# Patient Record
Sex: Female | Born: 1990
Health system: Southern US, Community
[De-identification: ages and names within clinical notes are randomized; demographics above are authoritative.]

## PROBLEM LIST (undated history)

## (undated) DIAGNOSIS — E559 Vitamin D deficiency, unspecified: Secondary | ICD-10-CM

## (undated) DIAGNOSIS — T7840XA Allergy, unspecified, initial encounter: Secondary | ICD-10-CM

## (undated) DIAGNOSIS — R011 Cardiac murmur, unspecified: Secondary | ICD-10-CM

## (undated) DIAGNOSIS — D649 Anemia, unspecified: Secondary | ICD-10-CM

## (undated) DIAGNOSIS — I517 Cardiomegaly: Secondary | ICD-10-CM

## (undated) HISTORY — DX: Anemia, unspecified: D64.9

## (undated) HISTORY — PX: DENTAL SURGERY: SHX609

## (undated) HISTORY — DX: Cardiac murmur, unspecified: R01.1

## (undated) HISTORY — PX: TONSILLECTOMY: SUR1361

---

## 2013-11-11 ENCOUNTER — Emergency Department (HOSPITAL_COMMUNITY)
Admission: EM | Admit: 2013-11-11 | Discharge: 2013-11-11 | Disposition: A | Discharge status: Home / Self Care | Payer: No Typology Code available for payment source | Attending: Emergency Medicine | Admitting: Emergency Medicine

## 2013-11-11 ENCOUNTER — Encounter (HOSPITAL_COMMUNITY): Payer: Self-pay | Admitting: Emergency Medicine

## 2013-11-11 DIAGNOSIS — R221 Localized swelling, mass and lump, neck: Principal | ICD-10-CM

## 2013-11-11 DIAGNOSIS — Z8679 Personal history of other diseases of the circulatory system: Secondary | ICD-10-CM | POA: Insufficient documentation

## 2013-11-11 DIAGNOSIS — Y929 Unspecified place or not applicable: Secondary | ICD-10-CM | POA: Insufficient documentation

## 2013-11-11 DIAGNOSIS — R220 Localized swelling, mass and lump, head: Secondary | ICD-10-CM | POA: Insufficient documentation

## 2013-11-11 DIAGNOSIS — Y939 Activity, unspecified: Secondary | ICD-10-CM | POA: Insufficient documentation

## 2013-11-11 DIAGNOSIS — Z791 Long term (current) use of non-steroidal anti-inflammatories (NSAID): Secondary | ICD-10-CM | POA: Insufficient documentation

## 2013-11-11 DIAGNOSIS — T7840XA Allergy, unspecified, initial encounter: Secondary | ICD-10-CM

## 2013-11-11 DIAGNOSIS — E559 Vitamin D deficiency, unspecified: Secondary | ICD-10-CM | POA: Insufficient documentation

## 2013-11-11 DIAGNOSIS — T628X1A Toxic effect of other specified noxious substances eaten as food, accidental (unintentional), initial encounter: Secondary | ICD-10-CM | POA: Insufficient documentation

## 2013-11-11 HISTORY — DX: Cardiomegaly: I51.7

## 2013-11-11 HISTORY — DX: Vitamin D deficiency, unspecified: E55.9

## 2013-11-11 MED ORDER — EPINEPHRINE 0.3 MG/0.3ML IJ SOAJ: 0.3000 mg | INTRAMUSCULAR | Status: DC | PRN

## 2013-11-11 MED ORDER — METHYLPREDNISOLONE SODIUM SUCC 125 MG IJ SOLR
125.0000 mg | Freq: Once | INTRAMUSCULAR | Status: AC
  Administered 2013-11-11: 125 mg via INTRAVENOUS
  Filled 2013-11-11: qty 2

## 2013-11-11 MED ORDER — DIPHENHYDRAMINE HCL 50 MG/ML IJ SOLN
25.0000 mg | Freq: Once | INTRAMUSCULAR | Status: AC
  Administered 2013-11-11: 25 mg via INTRAVENOUS
  Filled 2013-11-11: qty 1

## 2013-11-11 MED ORDER — DIPHENHYDRAMINE HCL 25 MG PO TABS: 25.0000 mg | ORAL_TABLET | Freq: Four times a day (QID) | ORAL | Status: DC

## 2013-11-11 MED ORDER — PREDNISONE 10 MG PO TABS: 60.0000 mg | ORAL_TABLET | Freq: Every day | ORAL | Status: DC

## 2013-11-11 MED ORDER — FAMOTIDINE 20 MG PO TABS
20.0000 mg | ORAL_TABLET | Freq: Once | ORAL | Status: AC
  Administered 2013-11-11: 20 mg via ORAL
  Filled 2013-11-11: qty 1

## 2013-11-11 MED ORDER — EPINEPHRINE 0.3 MG/0.3ML IJ SOAJ
0.3000 mg | Freq: Once | INTRAMUSCULAR | Status: AC
  Administered 2013-11-11: 0.3 mg via INTRAMUSCULAR
  Filled 2013-11-11: qty 0.3

## 2013-11-11 NOTE — Discharge Instructions (Signed)
Epinephrine Injection °Epinephrine is a medicine given by injection to temporarily treat an emergency allergic reaction. It is also used to treat severe asthmatic attacks and other lung problems. The medicine helps to enlarge (dilate) the small breathing tubes of the lungs. A life-threatening, sudden allergic reaction that involves the whole body is called anaphylaxis. Because of potential side effects, epinephrine should only be used as directed by your caregiver. °RISKS AND COMPLICATIONS °Possible side effects of epinephrine injections include: °· Chest pain. °· Irregular or rapid heartbeat. °· Shortness of breath. °· Nausea. °· Vomiting. °· Abdominal pain or cramping. °· Sweating. °· Dizziness. °· Weakness. °· Headache. °· Nervousness. °Report all side effects to your caregiver. °HOW TO GIVE AN EPINEPHRINE INJECTION °Give the epinephrine injection immediately when symptoms of a severe reaction begin. Inject the medicine into the outer thigh or any available, large muscle. Your caregiver can teach you how to do this. You do not need to remove any clothing. After the injection, call your local emergency services (911 in [place] Even if you improve after the injection, you need to be examined at a hospital emergency department. Epinephrine works quickly, but it also wears off quickly. Delayed reactions can occur. A delayed reaction may be as serious and dangerous as the initial reaction. °HOME CARE INSTRUCTIONS °· Make sure you and your family know how to give an epinephrine injection. °· Use epinephrine injections as directed by your caregiver. Do not use this medicine more often or in larger doses than prescribed. °· Always carry your epinephrine injection or anaphylaxis kit with you. This can be lifesaving if you have a severe reaction. °· Store the medicine in a cool, dry place. If the medicine becomes discolored or cloudy, dispose of it properly and replace it with new medicine. °· Check the expiration date on  your medicine. It may be unsafe to use medicines past their expiration date. °· Tell your caregiver about any other medicines you are taking. Some medicines can react badly with epinephrine. °· Tell your caregiver about any medical conditions you have, such as diabetes, high blood pressure (hypertension), heart disease, irregular heartbeats, or if you are pregnant. °SEEK IMMEDIATE MEDICAL CARE IF: °· You have used an epinephrine injection. Call your local emergency services (911 in [place] Even if you improve after the injection, you need to be examined at a hospital emergency department to make sure your allergic reaction is under control. You will also be monitored for adverse effects from the medicine. °· You have chest pain. °· You have irregular or fast heartbeats. °· You have shortness of breath. °· You have severe headaches. °· You have severe nausea, vomiting, or abdominal cramps. °· You have severe pain, swelling, or redness in the area where you gave the injection. °Document Released: 04/19/2000 Document Revised: 07/15/2011 Document Reviewed: 01/09/2011 °ExitCare® Patient Information ©2015 [employer]. This information is not intended to replace advice given to you by your health care provider. Make sure you discuss any questions you have with your health care provider. ° °

## 2013-11-11 NOTE — ED Notes (Addendum)
Per pt went through [employer] drive thru ordering sprite and chicken fries. Pt reports eating chicken fries but started reaction with sip of sprite. Pt reports chest tightness, wheezing, SOB, and throat/tongue swelling. After reaction started pt noticed sprite appeared gold in color. Pt reports taking 12.5 mg of liquid benadryl. Pt reports when arrived to ED NO chest tightness and wheezing. Upon assessment pt tongue appears swollen and pt reports itching of throat/tongue.

## 2013-11-11 NOTE — ED Provider Notes (Signed)
CSN: [id number]     Arrival date & time 11/11/13  1318 History   First MD Initiated Contact with Patient 11/11/13 1339     Chief Complaint  Patient presents with  . Allergic Reaction      HPI Patient was brought to the emergency department because of allergic reaction that began just prior to arrival.  She has a history of peanut allergy.  She feels as though she is having swelling of her tongue and throat.  Started Benadryl and has had mild improvement in her symptoms.  She states she still feels her tongue was swollen and her voice is still abnormal.  No difficulty breathing.  Initially she states she felt as though her throat was closing up was given Benadryl by her mother.  Patient has an EpiPen but decided not to use it.   Past Medical History  Diagnosis Date  . Enlarged heart   . Vitamin D deficiency    Past Surgical History  Procedure Laterality Date  . Tonsillectomy    . Dental surgery     Family History  Problem Relation Age of Onset  . Hypertension Mother    History  Substance Use Topics  . Smoking status: Never Smoker   . Smokeless tobacco: Never Used  . Alcohol Use: No   OB History   Grav Para Term Preterm Abortions TAB SAB Ect Mult Living                 Review of Systems  All other systems reviewed and are negative.     Allergies  Peanuts  Home Medications   Prior to Admission medications   Medication Sig Start Date End Date Taking? Authorizing Provider  Adapalene-Benzoyl Peroxide 0.1-2.5 % gel Apply 1 application topically at bedtime.   Yes Historical Provider, MD  diphenhydrAMINE (BENADRYL) 25 MG tablet Take 1 tablet (25 mg total) by mouth every 6 (six) hours. 11/11/13   Lyanne Co, MD  EPINEPHrine (EPIPEN) 0.3 mg/0.3 mL IJ SOAJ injection Inject 0.3 mLs (0.3 mg total) into the muscle as needed. 11/11/13   Lyanne Co, MD  ibuprofen (ADVIL,MOTRIN) 200 MG tablet Take 400 mg by mouth every 4 (four) hours as needed for moderate pain.   Yes  Historical Provider, MD  predniSONE (DELTASONE) 10 MG tablet Take 6 tablets (60 mg total) by mouth daily. 11/11/13   Lyanne Co, MD  Vitamin D, Ergocalciferol, (DRISDOL) 50000 UNITS CAPS capsule Take 50,000 Units by mouth 2 (two) times a week.   Yes Historical Provider, MD   BP 115/57  Pulse 96  Temp(Src) 98.5 F (36.9 C) (Oral)  Resp 16  Ht 5\' 7"  (1.702 m)  Wt 240 lb (108.863 kg)  BMI 37.58 kg/m2  SpO2 100%  LMP 10/15/2013 Physical Exam  Nursing note and vitals reviewed. Constitutional: She is oriented to person, place, and time. She appears well-developed and well-nourished. No distress.  HENT:  Head: Normocephalic and atraumatic.  Eyes: EOM are normal.  Neck: Normal range of motion.  Cardiovascular: Normal rate, regular rhythm and normal heart sounds.   Pulmonary/Chest: Effort normal and breath sounds normal.  Abdominal: Soft. She exhibits no distension. There is no tenderness.  Musculoskeletal: Normal range of motion.  Neurological: She is alert and oriented to person, place, and time.  Skin: Skin is warm and dry.  Psychiatric: She has a normal mood and affect. Judgment normal.    ED Course  Procedures (including critical care time) Labs Review Labs Reviewed -  No data to display  Imaging Review No results found.   EKG Interpretation None      MDM   Final diagnoses:  Allergic reaction, initial encounter    4:04 PM Patient feels much better at this time.  Patient was given epinephrine in the emergency department.  I instructed should the patient and the patient's mother how to inject this.  I've instructed them that in the future the epinephrine should be given and should be given quickly.    Lyanne CoKevin M Mariadejesus Cade, MD 11/11/13 [phone]

## 2013-11-11 NOTE — ED Notes (Signed)
Patient c/o hAving an allergic reaction with swelling of the tongue and throat. Patient states she did have wheezing and chest tightness 30 minutes go. Patient states that she ordered a Sprite and was given a drink by an employee. Patient states she took a sip of the drink and had an immediate reaction of chest tightness, wheezing , and difficulty swallowing. [employer] was flagged down by the mother and called EMS and directed her to the ED. Patient took Benadryl po prior to coming to the ED.

## 2013-11-18 ENCOUNTER — Ambulatory Visit (HOSPITAL_COMMUNITY): Payer: Self-pay | Admitting: Psychiatry

## 2013-11-30 ENCOUNTER — Ambulatory Visit: Payer: Self-pay | Admitting: Cardiovascular Disease

## 2013-12-27 ENCOUNTER — Emergency Department (HOSPITAL_COMMUNITY): Payer: No Typology Code available for payment source

## 2013-12-27 ENCOUNTER — Emergency Department (HOSPITAL_COMMUNITY)
Admission: EM | Admit: 2013-12-27 | Discharge: 2013-12-27 | Disposition: A | Discharge status: Home / Self Care | Payer: No Typology Code available for payment source | Attending: Emergency Medicine | Admitting: Emergency Medicine

## 2013-12-27 ENCOUNTER — Encounter (HOSPITAL_COMMUNITY): Payer: Self-pay | Admitting: Emergency Medicine

## 2013-12-27 DIAGNOSIS — Z79899 Other long term (current) drug therapy: Secondary | ICD-10-CM | POA: Insufficient documentation

## 2013-12-27 DIAGNOSIS — S0993XA Unspecified injury of face, initial encounter: Secondary | ICD-10-CM | POA: Insufficient documentation

## 2013-12-27 DIAGNOSIS — IMO0002 Reserved for concepts with insufficient information to code with codable children: Secondary | ICD-10-CM | POA: Insufficient documentation

## 2013-12-27 DIAGNOSIS — S134XXA Sprain of ligaments of cervical spine, initial encounter: Secondary | ICD-10-CM

## 2013-12-27 DIAGNOSIS — S199XXA Unspecified injury of neck, initial encounter: Secondary | ICD-10-CM

## 2013-12-27 DIAGNOSIS — Z8679 Personal history of other diseases of the circulatory system: Secondary | ICD-10-CM | POA: Insufficient documentation

## 2013-12-27 DIAGNOSIS — S139XXA Sprain of joints and ligaments of unspecified parts of neck, initial encounter: Secondary | ICD-10-CM | POA: Insufficient documentation

## 2013-12-27 DIAGNOSIS — S0990XA Unspecified injury of head, initial encounter: Secondary | ICD-10-CM | POA: Diagnosis present

## 2013-12-27 DIAGNOSIS — Y92410 Unspecified street and highway as the place of occurrence of the external cause: Secondary | ICD-10-CM | POA: Diagnosis not present

## 2013-12-27 DIAGNOSIS — Y9389 Activity, other specified: Secondary | ICD-10-CM | POA: Insufficient documentation

## 2013-12-27 DIAGNOSIS — E559 Vitamin D deficiency, unspecified: Secondary | ICD-10-CM | POA: Diagnosis not present

## 2013-12-27 HISTORY — DX: Allergy, unspecified, initial encounter: T78.40XA

## 2013-12-27 MED ORDER — HYDROCODONE-ACETAMINOPHEN 5-325 MG PO TABS: 1.0000 | ORAL_TABLET | Freq: Once | ORAL | Status: DC

## 2013-12-27 MED ORDER — TRAMADOL HCL 50 MG PO TABS: 50.0000 mg | ORAL_TABLET | Freq: Four times a day (QID) | ORAL | Status: DC | PRN

## 2013-12-27 MED ORDER — IBUPROFEN 800 MG PO TABS
800.0000 mg | ORAL_TABLET | Freq: Once | ORAL | Status: DC
  Filled 2013-12-27: qty 1

## 2013-12-27 MED ORDER — CYCLOBENZAPRINE HCL 10 MG PO TABS: 10.0000 mg | ORAL_TABLET | Freq: Two times a day (BID) | ORAL | Status: DC | PRN

## 2013-12-27 MED ORDER — ONDANSETRON 4 MG PO TBDP: 4.0000 mg | ORAL_TABLET | Freq: Once | ORAL | Status: DC

## 2013-12-27 NOTE — ED Notes (Signed)
Notified PA earlier that pt wanted to d/c home.  Pt refusing all meds.  DC home post PA conference.
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
## 2013-12-27 NOTE — Discharge Instructions (Signed)
Muscle Strain °A muscle strain is an injury that occurs when a muscle is stretched beyond its normal length. Usually a small number of muscle fibers are torn when this happens. Muscle strain is rated in degrees. First-degree strains have the least amount of muscle fiber tearing and pain. Second-degree and third-degree strains have increasingly more tearing and pain.  °Usually, recovery from muscle strain takes 1-2 weeks. Complete healing takes 5-6 weeks.  °CAUSES  °Muscle strain happens when a sudden, violent force placed on a muscle stretches it too far. This may occur with lifting, sports, or a fall.  °RISK FACTORS °Muscle strain is especially common in athletes.  °SIGNS AND SYMPTOMS °At the site of the muscle strain, there may be: °· Pain. °· Bruising. °· Swelling. °· Difficulty using the muscle due to pain or lack of normal function. °DIAGNOSIS  °Your health care provider will perform a physical exam and ask about your medical history. °TREATMENT  °Often, the best treatment for a muscle strain is resting, icing, and applying cold compresses to the injured area.   °HOME CARE INSTRUCTIONS  °· Use the PRICE method of treatment to promote muscle healing during the first 2-3 days after your injury. The PRICE method involves: °· Protecting the muscle from being injured again. °· Restricting your activity and resting the injured body part. °· Icing your injury. To do this, put ice in a plastic bag. Place a towel between your skin and the bag. Then, apply the ice and leave it on from 15-20 minutes each hour. After the third day, switch to moist heat packs. °· Apply compression to the injured area with a splint or elastic bandage. Be careful not to wrap it too tightly. This may interfere with blood circulation or increase swelling. °· Elevate the injured body part above the level of your heart as often as you can. °· Only take over-the-counter or prescription medicines for pain, discomfort, or fever as directed by your  health care provider. °· Warming up prior to exercise helps to prevent future muscle strains. °SEEK MEDICAL CARE IF:  °· You have increasing pain or swelling in the injured area. °· You have numbness, tingling, or a significant loss of strength in the injured area. °MAKE SURE YOU:  °· Understand these instructions. °· Will watch your condition. °· Will get help right away if you are not doing well or get worse. °Document Released: 04/22/2005 Document Revised: 02/10/2013 Document Reviewed: 11/19/2012 °ExitCare® Patient Information ©2015 [employer]. This information is not intended to replace advice given to you by your health care provider. Make sure you discuss any questions you have with your health care provider. ° °Motor Vehicle Collision °It is common to have multiple bruises and sore muscles after a motor vehicle collision (MVC). These tend to feel worse for the first 24 hours. You may have the most stiffness and soreness over the first several hours. You may also feel worse when you wake up the first morning after your collision. After this point, you will usually begin to improve with each day. The speed of improvement often depends on the severity of the collision, the number of injuries, and the location and nature of these injuries. °HOME CARE INSTRUCTIONS °· Put ice on the injured area. °¨ Put ice in a plastic bag. °¨ Place a towel between your skin and the bag. °¨ Leave the ice on for 15-20 minutes, 3-4 times a day, or as directed by your health care provider. °· Drink enough fluids to   keep your urine clear or pale yellow. Do not drink alcohol. °· Take a warm shower or bath once or twice a day. This will increase blood flow to sore muscles. °· You may return to activities as directed by your caregiver. Be careful when lifting, as this may aggravate neck or back pain. °· Only take over-the-counter or prescription medicines for pain, discomfort, or fever as directed by your caregiver. Do not use  aspirin. This may increase bruising and bleeding. °SEEK IMMEDIATE MEDICAL CARE IF: °· You have numbness, tingling, or weakness in the arms or legs. °· You develop severe headaches not relieved with medicine. °· You have severe neck pain, especially tenderness in the middle of the back of your neck. °· You have changes in bowel or bladder control. °· There is increasing pain in any area of the body. °· You have shortness of breath, light-headedness, dizziness, or fainting. °· You have chest pain. °· You feel sick to your stomach (nauseous), throw up (vomit), or sweat. °· You have increasing abdominal discomfort. °· There is blood in your urine, stool, or vomit. °· You have pain in your shoulder (shoulder strap areas). °· You feel your symptoms are getting worse. °MAKE SURE YOU: °· Understand these instructions. °· Will watch your condition. °· Will get help right away if you are not doing well or get worse. °Document Released: 04/22/2005 Document Revised: 09/06/2013 Document Reviewed: 09/19/2010 °ExitCare® Patient Information ©2015 [employer]. This information is not intended to replace advice given to you by your health care provider. Make sure you discuss any questions you have with your health care provider. ° °
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
## 2013-12-28 NOTE — ED Provider Notes (Signed)
Medical screening examination/treatment/procedure(s) were performed by non-physician practitioner and as supervising physician I was immediately available for consultation/collaboration.   EKG Interpretation None       Raeford Razor, MD 12/28/13 [phone]

## 2014-03-06 ENCOUNTER — Ambulatory Visit (INDEPENDENT_AMBULATORY_CARE_PROVIDER_SITE_OTHER): Payer: No Typology Code available for payment source

## 2014-03-06 ENCOUNTER — Ambulatory Visit (INDEPENDENT_AMBULATORY_CARE_PROVIDER_SITE_OTHER): Payer: No Typology Code available for payment source | Admitting: Family Medicine

## 2014-03-06 VITALS — BP 118/70 | HR 97 | Temp 99.0°F | Resp 18 | Ht 66.0 in | Wt 264.0 lb

## 2014-03-06 DIAGNOSIS — G939 Disorder of brain, unspecified: Secondary | ICD-10-CM

## 2014-03-06 DIAGNOSIS — R05 Cough: Secondary | ICD-10-CM

## 2014-03-06 DIAGNOSIS — R0600 Dyspnea, unspecified: Secondary | ICD-10-CM

## 2014-03-06 DIAGNOSIS — R059 Cough, unspecified: Secondary | ICD-10-CM

## 2014-03-06 DIAGNOSIS — G9389 Other specified disorders of brain: Secondary | ICD-10-CM

## 2014-03-06 DIAGNOSIS — R509 Fever, unspecified: Secondary | ICD-10-CM

## 2014-03-06 DIAGNOSIS — R599 Enlarged lymph nodes, unspecified: Secondary | ICD-10-CM

## 2014-03-06 DIAGNOSIS — N926 Irregular menstruation, unspecified: Secondary | ICD-10-CM

## 2014-03-06 DIAGNOSIS — R591 Generalized enlarged lymph nodes: Secondary | ICD-10-CM

## 2014-03-06 DIAGNOSIS — W19XXXA Unspecified fall, initial encounter: Secondary | ICD-10-CM

## 2014-03-06 LAB — POCT CBC
Granulocyte percent: 56.6 %G (ref 37–80)
HEMATOCRIT: 37.7 % (ref 37.7–47.9)
Hemoglobin: 11.7 g/dL — AB (ref 12.2–16.2)
LYMPH, POC: 3.5 — AB (ref 0.6–3.4)
MCH, POC: 25.7 pg — AB (ref 27–31.2)
MCHC: 30.9 g/dL — AB (ref 31.8–35.4)
MCV: 82.9 fL (ref 80–97)
MID (cbc): 0.2 (ref 0–0.9)
MPV: 9.5 fL (ref 0–99.8)
POC GRANULOCYTE: 4.9 (ref 2–6.9)
POC LYMPH %: 40.7 % (ref 10–50)
POC MID %: 2.7 %M (ref 0–12)
Platelet Count, POC: 285 10*3/uL (ref 142–424)
RBC: 4.55 M/uL (ref 4.04–5.48)
RDW, POC: 16.4 %
WBC: 8.6 10*3/uL (ref 4.6–10.2)

## 2014-03-06 LAB — POCT URINALYSIS DIPSTICK
Bilirubin, UA: NEGATIVE
Glucose, UA: NEGATIVE
Ketones, UA: NEGATIVE
Leukocytes, UA: NEGATIVE
NITRITE UA: NEGATIVE
PH UA: 5.5
PROTEIN UA: NEGATIVE
Spec Grav, UA: 1.015
Urobilinogen, UA: 0.2

## 2014-03-06 LAB — POCT UA - MICROSCOPIC ONLY
BACTERIA, U MICROSCOPIC: NEGATIVE
CRYSTALS, UR, HPF, POC: NEGATIVE
Casts, Ur, LPF, POC: NEGATIVE
Mucus, UA: NEGATIVE
WBC, Ur, HPF, POC: NEGATIVE
YEAST UA: NEGATIVE

## 2014-03-06 LAB — POCT URINE PREGNANCY: Preg Test, Ur: NEGATIVE

## 2014-03-06 LAB — GLUCOSE, POCT (MANUAL RESULT ENTRY): POC Glucose: 77 mg/dl (ref 70–99)

## 2014-03-06 MED ORDER — AMOXICILLIN-POT CLAVULANATE 600-42.9 MG/5ML PO SUSR: 900.0000 mg | Freq: Two times a day (BID) | ORAL | Status: DC

## 2014-03-06 MED ORDER — IPRATROPIUM BROMIDE 0.03 % NA SOLN: 2.0000 | Freq: Two times a day (BID) | NASAL | Status: DC

## 2014-03-06 MED ORDER — DM-GUAIFENESIN ER 30-600 MG PO TB12: 1.0000 | ORAL_TABLET | Freq: Two times a day (BID) | ORAL | Status: DC

## 2014-03-06 NOTE — Progress Notes (Signed)
Subjective:    Patient ID: Kelli Howard, female    DOB: [birth year], 23 y.o.   MRN: [id number]  03/06/2014  Cough and mri   HPI This 23 y.o. female presents for evaluation of the following:  1.  Recurrent illness:  Onset in past yar.  Gets extremely weak and dyspneic.  Fine when went to [place].  Then upon return from [place], developed fever to 105.0, headache.  Last fever was five days ago.  No headache over 100.0 in past five days.  Still having headaches.  Sneezing and coughing fits.  Coughing causes worsening headache.  +ST severe with onset; s/p tonsillectomy.  +ear pain R post-auricular region.    +Nausea; no vomiting.  Loose stools and orange.  Skin was peeling due to sun burn.  Motion sickness medication, Benadryl, nyquil, motrin.  Student at [employer].  Not working.  [place] for vacation for three days.  With recurrent illness, develops chest tightness, difficulties walking, and weakness.  Sleeping non-stop.  +menstrual irregularity; will range from 28 days to 42 days.  2.  Fall:  While in [place]; hit head really badly and underwent ED evaluation.  S/p MRI in [place] and diagnosed with brain mass.  Recommended follow-up upon returning to [place].  Returned on 02-19-14.  Mother reports two near syncopal versus syncopal episodes in the past few months.  Denies visual changes, confusion, personality differences.   Review of Systems  Constitutional: Positive for fever, chills, diaphoresis and fatigue.  HENT: Positive for congestion and rhinorrhea. Negative for ear pain, sinus pressure, sneezing, sore throat, trouble swallowing and voice change.   Eyes: Negative for photophobia and visual disturbance.  Respiratory: Positive for cough and shortness of breath. Negative for wheezing and stridor.   Cardiovascular: Negative for chest pain, palpitations and leg swelling.  Gastrointestinal: Positive for nausea. Negative for vomiting, abdominal pain and diarrhea.  Genitourinary: Negative for  dysuria.  Skin: Negative for rash.  Neurological: Positive for syncope and headaches. Negative for dizziness, tremors, seizures, facial asymmetry, speech difficulty, weakness, light-headedness and numbness.    Past Medical History  Diagnosis Date  . Enlarged heart   . Vitamin D deficiency   . Allergy   . Anemia   . Heart murmur    Past Surgical History  Procedure Laterality Date  . Tonsillectomy    . Dental surgery     Allergies  Allergen Reactions  . Peanuts [Peanut Oil] Anaphylaxis    Also: tree nuts   . Banana     Unknown reaction    Current Outpatient Prescriptions  Medication Sig Dispense Refill  . Adapalene-Benzoyl Peroxide 0.1-2.5 % gel Apply 1 application topically at bedtime.    Marland Kitchen. EPINEPHrine (EPIPEN) 0.3 mg/0.3 mL IJ SOAJ injection Inject 0.3 mLs (0.3 mg total) into the muscle as needed. 1 Device 3  . Vitamin D, Ergocalciferol, (DRISDOL) 50000 UNITS CAPS capsule Take 50,000 Units by mouth 2 (two) times a week.    Marland Kitchen. amoxicillin-clavulanate (AUGMENTIN) 600-42.9 MG/5ML suspension Take 7.5 mLs (900 mg total) by mouth 2 (two) times daily. 150 mL 0  . cyclobenzaprine (FLEXERIL) 10 MG tablet Take 1 tablet (10 mg total) by mouth 2 (two) times daily as needed for muscle spasms. 20 tablet 0  . dextromethorphan-guaiFENesin (MUCINEX DM) 30-600 MG per 12 hr tablet Take 1 tablet by mouth 2 (two) times daily. 20 tablet 0  . diphenhydrAMINE (BENADRYL) 25 MG tablet Take 1 tablet (25 mg total) by mouth every 6 (six) hours. 20 tablet 0  .  ibuprofen (ADVIL,MOTRIN) 200 MG tablet Take 400 mg by mouth every 4 (four) hours as needed for moderate pain.    Marland Kitchen. ipratropium (ATROVENT) 0.03 % nasal spray Place 2 sprays into the nose 2 (two) times daily. 30 mL 0  . predniSONE (DELTASONE) 10 MG tablet Take 6 tablets (60 mg total) by mouth daily. 18 tablet 0  . traMADol (ULTRAM) 50 MG tablet Take 1 tablet (50 mg total) by mouth every 6 (six) hours as needed. 15 tablet 0   No current  facility-administered medications for this visit.       Objective:    BP 118/70 mmHg  Pulse 97  Temp(Src) 99 F (37.2 C) (Oral)  Resp 18  Ht 5\' 6"  (1.676 m)  Wt 264 lb (119.75 kg)  BMI 42.63 kg/m2  SpO2 100%  LMP 02/04/2014 Physical Exam  Constitutional: She is oriented to person, place, and time. She appears well-developed and well-nourished. No distress.  obese  HENT:  Head: Normocephalic and atraumatic.  Right Ear: External ear normal.  Left Ear: External ear normal.  Nose: Nose normal.  Mouth/Throat: Oropharynx is clear and moist.  Eyes: Conjunctivae are normal. Pupils are equal, round, and reactive to light.  Neck: Normal range of motion. Neck supple. No thyromegaly present.  Cardiovascular: Normal rate, regular rhythm and normal heart sounds.  Exam reveals no gallop and no friction rub.   No murmur heard. Pulmonary/Chest: Effort normal and breath sounds normal. She has no wheezes. She has no rales.  Abdominal: Soft. Bowel sounds are normal. She exhibits no distension and no mass. There is no tenderness. There is no rebound and no guarding.  Lymphadenopathy:       Head (right side): Preauricular and posterior auricular adenopathy present.    She has no cervical adenopathy.  Neurological: She is alert and oriented to person, place, and time. She has normal reflexes. She displays normal reflexes. No cranial nerve deficit. She exhibits normal muscle tone. Coordination normal.  Skin: Skin is warm and dry. No rash noted. She is not diaphoretic.  Psychiatric: She has a normal mood and affect. Her behavior is normal. Judgment and thought content normal.  Nursing note and vitals reviewed.   [facility] reading (PRIMARY) by  Dr. Katrinka BlazingSmith.  CXR:  NAD      Assessment & Plan:   1. Fever, unspecified fever cause   2. Cough   3. Dyspnea   4. Lymphadenopathy of head and neck   5. Mass, brain   6. Fall, initial encounter     1.  Fever: onset two weeks ago; slowly improving.   Associated with nasal congestion, cough, cervical LAD. Obtain EBV titers.  CXR negative for infiltrate.   Treat with Augmentin, Atrovent nasal spray, Mucinex DM. 2. DOE:  New. CXR negative; obtain labs.  RTC for acute worsening. Onset with acute illness. 3.  LAD cervical and pre and post auricular: New. Obtain EBV titers; treat empirically with Augmentin, Atrovent nasal spray. 4. Brain mass: New; detected by MRI in [place]; refer for MRI brain; refer to neurology. 5. Fall: New. With head trauma; normal neurological exam; no suggestion of concussion. 6. URI: New. Associated with two week history of symptoms; likely viral syndrome with prolonged course.  Close follow-up; will treat with Augmentin while awaiting results.    Meds ordered this encounter  Medications  . ipratropium (ATROVENT) 0.03 % nasal spray    Sig: Place 2 sprays into the nose 2 (two) times daily.    Dispense:  30  mL    Refill:  0  . dextromethorphan-guaiFENesin (MUCINEX DM) 30-600 MG per 12 hr tablet    Sig: Take 1 tablet by mouth 2 (two) times daily.    Dispense:  20 tablet    Refill:  0  . amoxicillin-clavulanate (AUGMENTIN) 600-42.9 MG/5ML suspension    Sig: Take 7.5 mLs (900 mg total) by mouth 2 (two) times daily.    Dispense:  150 mL    Refill:  0    No Follow-up on file.    Nilda Simmer, M.D.  [facility] [address] [place], [place]  [address] [phone] phone [phone] fax

## 2014-03-07 LAB — COMPREHENSIVE METABOLIC PANEL
ALT: 14 U/L (ref 0–35)
AST: 17 U/L (ref 0–37)
Albumin: 4 g/dL (ref 3.5–5.2)
Alkaline Phosphatase: 64 U/L (ref 39–117)
BUN: 10 mg/dL (ref 6–23)
CALCIUM: 9.2 mg/dL (ref 8.4–10.5)
CHLORIDE: 106 meq/L (ref 96–112)
CO2: 24 meq/L (ref 19–32)
CREATININE: 0.74 mg/dL (ref 0.50–1.10)
GLUCOSE: 77 mg/dL (ref 70–99)
Potassium: 4.2 mEq/L (ref 3.5–5.3)
Sodium: 139 mEq/L (ref 135–145)
Total Bilirubin: 0.2 mg/dL (ref 0.2–1.2)
Total Protein: 6.8 g/dL (ref 6.0–8.3)

## 2014-03-07 LAB — EPSTEIN-BARR VIRUS VCA ANTIBODY PANEL
EBV EA IgG: <5 U/mL (ref <9.0)
EBV NA IgG: 265 U/mL — ABNORMAL HIGH (ref <18.0)
EBV VCA IgM: <10 U/mL (ref <36.0)

## 2014-03-11 ENCOUNTER — Telehealth: Payer: Self-pay | Admitting: Family Medicine

## 2014-03-11 NOTE — Telephone Encounter (Signed)
-----   Message from Ethelda ChickKristi M Smith, MD sent at 03/10/2014  5:52 PM EST ----- Please schedule OV with me in 3 weeks.

## 2014-03-11 NOTE — Telephone Encounter (Signed)
LMOM to CB. 

## 2014-03-13 ENCOUNTER — Ambulatory Visit
Admission: RE | Admit: 2014-03-13 | Discharge: 2014-03-13 | Disposition: A | Discharge status: Home / Self Care | Payer: No Typology Code available for payment source | Source: Ambulatory Visit | Attending: Family Medicine | Admitting: Family Medicine

## 2014-03-13 DIAGNOSIS — G9389 Other specified disorders of brain: Secondary | ICD-10-CM

## 2014-03-16 ENCOUNTER — Telehealth: Payer: Self-pay | Admitting: *Deleted

## 2014-03-16 NOTE — Telephone Encounter (Signed)
Pt would like results of MRI.  Can you please review?

## 2014-03-21 ENCOUNTER — Ambulatory Visit: Payer: No Typology Code available for payment source | Admitting: Family Medicine

## 2014-03-27 NOTE — Telephone Encounter (Signed)
Pt.notified

## 2015-04-28 ENCOUNTER — Ambulatory Visit (INDEPENDENT_AMBULATORY_CARE_PROVIDER_SITE_OTHER): Payer: 59 | Admitting: Family Medicine

## 2015-04-28 ENCOUNTER — Ambulatory Visit (INDEPENDENT_AMBULATORY_CARE_PROVIDER_SITE_OTHER): Payer: 59

## 2015-04-28 VITALS — BP 110/70 | HR 86 | Temp 98.8°F | Resp 20 | Ht 67.0 in | Wt 245.8 lb

## 2015-04-28 DIAGNOSIS — R509 Fever, unspecified: Secondary | ICD-10-CM

## 2015-04-28 DIAGNOSIS — R0602 Shortness of breath: Secondary | ICD-10-CM

## 2015-04-28 DIAGNOSIS — J189 Pneumonia, unspecified organism: Secondary | ICD-10-CM | POA: Diagnosis not present

## 2015-04-28 LAB — POCT CBC
GRANULOCYTE PERCENT: 54.4 % (ref 37–80)
HCT, POC: 36.1 % — AB (ref 37.7–47.9)
Hemoglobin: 11.5 g/dL — AB (ref 12.2–16.2)
Lymph, poc: 4.6 — AB (ref 0.6–3.4)
MCH: 25.4 pg — AB (ref 27–31.2)
MCHC: 31.9 g/dL (ref 31.8–35.4)
MCV: 79.8 fL — AB (ref 80–97)
MID (CBC): 1 — AB (ref 0–0.9)
MPV: 8.4 fL (ref 0–99.8)
POC GRANULOCYTE: 6.7 (ref 2–6.9)
POC LYMPH PERCENT: 37.7 %L (ref 10–50)
POC MID %: 7.9 %M (ref 0–12)
Platelet Count, POC: 283 10*3/uL (ref 142–424)
RBC: 4.52 M/uL (ref 4.04–5.48)
RDW, POC: 15.4 %
WBC: 12.3 10*3/uL — AB (ref 4.6–10.2)

## 2015-04-28 LAB — POCT INFLUENZA A/B
INFLUENZA B, POC: NEGATIVE
Influenza A, POC: NEGATIVE

## 2015-04-28 LAB — POCT URINALYSIS DIP (MANUAL ENTRY)
BILIRUBIN UA: NEGATIVE
Blood, UA: NEGATIVE
GLUCOSE UA: NEGATIVE
Ketones, POC UA: NEGATIVE
Leukocytes, UA: NEGATIVE
Nitrite, UA: NEGATIVE
Protein Ur, POC: NEGATIVE
SPEC GRAV UA: 1.02
Urobilinogen, UA: 1
pH, UA: 7

## 2015-04-28 LAB — POC MICROSCOPIC URINALYSIS (UMFC): MUCUS RE: ABSENT

## 2015-04-28 MED ORDER — ALBUTEROL SULFATE 108 (90 BASE) MCG/ACT IN AEPB: 2.0000 | INHALATION_SPRAY | RESPIRATORY_TRACT | Status: DC | PRN

## 2015-04-28 MED ORDER — ALBUTEROL SULFATE (2.5 MG/3ML) 0.083% IN NEBU
2.5000 mg | INHALATION_SOLUTION | Freq: Once | RESPIRATORY_TRACT | Status: AC
  Administered 2015-04-28: 2.5 mg via RESPIRATORY_TRACT

## 2015-04-28 MED ORDER — ONDANSETRON 4 MG PO TBDP
4.0000 mg | ORAL_TABLET | Freq: Once | ORAL | Status: AC
  Administered 2015-04-28: 4 mg via ORAL

## 2015-04-28 MED ORDER — DM-GUAIFENESIN ER 30-600 MG PO TB12: 1.0000 | ORAL_TABLET | Freq: Two times a day (BID) | ORAL | Status: DC

## 2015-04-28 MED ORDER — BENZONATATE 200 MG PO CAPS: 200.0000 mg | ORAL_CAPSULE | Freq: Three times a day (TID) | ORAL | Status: DC | PRN

## 2015-04-28 MED ORDER — AZITHROMYCIN 500 MG PO TABS: 500.0000 mg | ORAL_TABLET | Freq: Every day | ORAL | Status: DC

## 2015-04-28 MED ORDER — HYDROCODONE-GUAIFENESIN 2.5-200 MG/5ML PO SOLN: 5.0000 mL | ORAL | Status: DC | PRN

## 2015-04-28 MED ORDER — ONDANSETRON 4 MG PO TBDP: 4.0000 mg | ORAL_TABLET | Freq: Three times a day (TID) | ORAL | Status: DC | PRN

## 2015-04-28 NOTE — Progress Notes (Addendum)
Subjective:  By signing my name below, I, Rawaa Al Rifaie, attest that this documentation has been prepared under the direction and in the presence of Norberto Sorenson, MD.  Broadus John, Medical Scribe. 04/28/2015.  5:59 PM.    Patient ID: Kelli Howard, female    DOB: 1990-10-07, 24 y.o.   MRN: [id number]  Chief Complaint  Patient presents with  . Sore Throat    x 1 week  . Fever  . Cough    HPI HPI Comments: Kelli Howard is a 24 y.o. female who presents to [facility] complaining of flu-like symptoms, gradual onset 1 week ago. Pt reports that her symptoms started with cold-like symptoms that later developed to yellow colored productive cough, with symptoms of nausea, vomiting, sore throat neck stiffness, fever (last episode yesterday), night sweat, chest tightness, chest wall pain secondary to cough, shortness of breath, chest congestion, decreased in appetite, and fatigue. She states that she has not been able to eat or drink much during the past few days. Pt took Nyquil for the symptoms. She denies headache, urinary or bowel changes, or dysuria. She is UTD with the flu vaccine. Pt reports no history of asthma, URI, PNA, or smoking.   Pt reports no chance that she could be pregnant.   There are no active problems to display for this patient.  Past Medical History  Diagnosis Date  . Enlarged heart   . Vitamin D deficiency   . Allergy   . Anemia   . Heart murmur    Past Surgical History  Procedure Laterality Date  . Tonsillectomy    . Dental surgery     Allergies  Allergen Reactions  . Peanuts [Peanut Oil] Anaphylaxis    Also: tree nuts   . Banana     Unknown reaction    Prior to Admission medications   Medication Sig Start Date End Date Taking? Authorizing Provider  Adapalene-Benzoyl Peroxide 0.1-2.5 % gel Apply 1 application topically at bedtime. Reported on 04/28/2015    Historical Provider, MD  amoxicillin-clavulanate (AUGMENTIN)  600-42.9 MG/5ML suspension Take 7.5 mLs (900 mg total) by mouth 2 (two) times daily. Patient not taking: Reported on 04/28/2015 03/06/14   Ethelda Chick, MD  cyclobenzaprine (FLEXERIL) 10 MG tablet Take 1 tablet (10 mg total) by mouth 2 (two) times daily as needed for muscle spasms. Patient not taking: Reported on 04/28/2015 12/27/13   Marlon Pel, PA-C  dextromethorphan-guaiFENesin [facility] DM) 30-600 MG per 12 hr tablet Take 1 tablet by mouth 2 (two) times daily. Patient not taking: Reported on 04/28/2015 03/06/14   Ethelda Chick, MD  diphenhydrAMINE (BENADRYL) 25 MG tablet Take 1 tablet (25 mg total) by mouth every 6 (six) hours. Patient not taking: Reported on 04/28/2015 11/11/13   Azalia Bilis, MD  EPINEPHrine (EPIPEN) 0.3 mg/0.3 mL IJ SOAJ injection Inject 0.3 mLs (0.3 mg total) into the muscle as needed. Patient not taking: Reported on 04/28/2015 11/11/13   Azalia Bilis, MD  ibuprofen (ADVIL,MOTRIN) 200 MG tablet Take 400 mg by mouth every 4 (four) hours as needed for moderate pain. Reported on 04/28/2015    Historical Provider, MD  ipratropium (ATROVENT) 0.03 % nasal spray Place 2 sprays into the nose 2 (two) times daily. Patient not taking: Reported on 04/28/2015 03/06/14   Ethelda Chick, MD  predniSONE (DELTASONE) 10 MG tablet Take 6 tablets (60 mg total) by mouth daily. Patient not taking: Reported on 04/28/2015 11/11/13   Caryn Bee  Patria Mane, MD  traMADol (ULTRAM) 50 MG tablet Take 1 tablet (50 mg total) by mouth every 6 (six) hours as needed. Patient not taking: Reported on 04/28/2015 12/27/13   Marlon Pel, PA-C  Vitamin D, Ergocalciferol, (DRISDOL) 50000 UNITS CAPS capsule Take 50,000 Units by mouth 2 (two) times a week. Reported on 04/28/2015    Historical Provider, MD   Social History   Social History  . Marital Status: Single    Spouse Name: N/A  . Number of Children: N/A  . Years of Education: N/A   Occupational History  . Not on file.   Social History Main Topics  . Smoking  status: Never Smoker   . Smokeless tobacco: Never Used  . Alcohol Use: No  . Drug Use: No  . Sexual Activity: No   Other Topics Concern  . Not on file   Social History Narrative    Review of Systems  Constitutional: Positive for fever, diaphoresis, appetite change and fatigue.  HENT: Positive for congestion and sore throat.   Respiratory: Positive for cough, chest tightness and shortness of breath.   Gastrointestinal: Positive for nausea and vomiting. Negative for diarrhea and constipation.  Genitourinary: Negative for dysuria and frequency.  Musculoskeletal: Positive for neck stiffness.  Neurological: Positive for headaches.      Objective:   Physical Exam  Constitutional: She is oriented to person, place, and time. She appears well-developed and well-nourished. No distress.  HENT:  Head: Normocephalic and atraumatic.  Right Ear: Tympanic membrane is erythematous.  Left Ear: Tympanic membrane normal.  Nose: Nose normal.  Mouth/Throat: Oropharynx is clear and moist. No oropharyngeal exudate.  Anterior adenopathy BL. Possible thyromegaly.  Eyes: EOM are normal. Pupils are equal, round, and reactive to light.  Neck: Neck supple.  Cardiovascular: Regular rhythm.  Tachycardia present.   No murmur heard. Pulmonary/Chest: Effort normal. She has rhonchi.  Expiratory rhonchi in the left lower lobe the cleared with coughing.   Musculoskeletal:  Normal C-spine ROM.   Neurological: She is alert and oriented to person, place, and time. No cranial nerve deficit.  Skin: Skin is warm and dry.  Psychiatric: She has a normal mood and affect. Her behavior is normal.  Nursing note and vitals reviewed.  Results for orders placed or performed in visit on 04/28/15  POCT CBC  Result Value Ref Range   WBC 12.3 (A) 4.6 - 10.2 K/uL   Lymph, poc 4.6 (A) 0.6 - 3.4   POC LYMPH PERCENT 37.7 10 - 50 %L   MID (cbc) 1.0 (A) 0 - 0.9   POC MID % 7.9 0 - 12 %M   POC Granulocyte 6.7 2 - 6.9    Granulocyte percent 54.4 37 - 80 %G   RBC 4.52 4.04 - 5.48 M/uL   Hemoglobin 11.5 (A) 12.2 - 16.2 g/dL   HCT, POC 16.1 (A) 09.6 - 47.9 %   MCV 79.8 (A) 80 - 97 fL   MCH, POC 25.4 (A) 27 - 31.2 pg   MCHC 31.9 31.8 - 35.4 g/dL   RDW, POC 04.5 %   Platelet Count, POC 283 142 - 424 K/uL   MPV 8.4 0 - 99.8 fL  POCT urinalysis dipstick  Result Value Ref Range   Color, UA yellow yellow   Clarity, UA cloudy (A) clear   Glucose, UA negative negative   Bilirubin, UA negative negative   Ketones, POC UA negative negative   Spec Grav, UA 1.020    Blood, UA negative negative  pH, UA 7.0    Protein Ur, POC negative negative   Urobilinogen, UA 1.0    Nitrite, UA Negative Negative   Leukocytes, UA Negative Negative  POCT Microscopic Urinalysis ([facility])  Result Value Ref Range   WBC,UR,HPF,POC None None WBC/hpf   RBC,UR,HPF,POC None None RBC/hpf   Bacteria None None, Too numerous to count   Mucus Absent Absent   Epithelial Cells, UR Per Microscopy Few (A) None, Too numerous to count cells/hpf    [facility] (PRIMARY) x-ray report read by Dr. Norberto SorensonEva Viola Placeres, MD: Chest-Coarsening of the bronchial markings consistent with bronchitis. Question of some slight consolidation of the left antero cardiac area.  Dg Chest 2 View  04/28/2015  CLINICAL DATA:  Fever, shortness of breath and cough EXAM: CHEST  2 VIEW COMPARISON:  March 06, 2014 FINDINGS: The heart size and mediastinal contours are within normal limits. There is mild increased pulmonary interstitium and opacity of the right upper lobe. There is no pulmonary edema or pleural effusion. The visualized skeletal structures are unremarkable. IMPRESSION: Mild increased pulmonary interstitium opacity of the right upper lobe which can be seen in early pneumonia or viral infection. Electronically Signed   By: Sherian ReinWei-Chen  Lin M.D.   On: 04/28/2015 18:33     BP 110/70 mmHg  Pulse 86  Temp(Src) 98.8 F (37.1 C) (Oral)  Resp 20  Ht 5\' 7"  (1.702 m)  Wt 245 lb 12.8 oz  (111.494 kg)  BMI 38.49 kg/m2  SpO2 98%  LMP 04/14/2015    Assessment & Plan:   1. Fever, unspecified   2. Shortness of breath   3. Walking pneumonia   Pt with improvement in pulmonary exam after alb neb in office.  Treat for CAPna with azithro 500 qd x 3d, recheck in 3d if no improvement, sooner if worsening. Recheck in 1 wk if acute illness not resolved but isolated non-productive cough may persist for up to 3 wks.  Try tessalon pearles during day and nyquil qhs but if cough is not responsive can then fill and try the hydrocodone syrup - pt staying with her mother who will hold this rx - they are both worried about addictive med exposures.   Orders Placed This Encounter  Procedures  . DG Chest 2 View    Standing Status: Future     Number of Occurrences: 1     Standing Expiration Date: 04/27/2016    Order Specific Question:  Reason for Exam (SYMPTOM  OR DIAGNOSIS REQUIRED)    Answer:  cough and  shortness of breath, acutely ill x 2 wks    Order Specific Question:  Is the patient pregnant?    Answer:  No    Order Specific Question:  Preferred imaging location?    Answer:  External  . Comprehensive metabolic panel  . Sedimentation Rate  . POCT CBC  . POCT urinalysis dipstick  . POCT Microscopic Urinalysis ([facility])  . POCT Influenza A/B    Meds ordered this encounter  Medications  . ondansetron (ZOFRAN-ODT) disintegrating tablet 4 mg    Sig:   . albuterol (PROVENTIL) (2.5 MG/3ML) 0.083% nebulizer solution 2.5 mg    Sig:   . dextromethorphan-guaiFENesin (MUCINEX DM) 30-600 MG 12hr tablet    Sig: Take 1 tablet by mouth 2 (two) times daily.    Dispense:  20 tablet    Refill:  0  . HYDROcodone-GuaiFENesin (FLOWTUSS) 2.5-200 MG/5ML SOLN    Sig: Take 5-10 mLs by mouth every 4 (four) hours as needed.  Dispense:  180 mL    Refill:  0  . ondansetron (ZOFRAN ODT) 4 MG disintegrating tablet    Sig: Take 1 tablet (4 mg total) by mouth every 8 (eight) hours as needed for nausea or  vomiting.    Dispense:  20 tablet    Refill:  0  . azithromycin (ZITHROMAX) 500 MG tablet    Sig: Take 1 tablet (500 mg total) by mouth daily.    Dispense:  3 tablet    Refill:  0  . Albuterol Sulfate (PROAIR RESPICLICK) 108 (90 BASE) MCG/ACT AEPB    Sig: Inhale 2 puffs into the lungs every 4 (four) hours as needed.    Dispense:  1 each    Refill:  2  . benzonatate (TESSALON) 200 MG capsule    Sig: Take 1 capsule (200 mg total) by mouth 3 (three) times daily as needed for cough.    Dispense:  30 capsule    Refill:  0    I personally performed the services described in this documentation, which was scribed in my presence. The recorded information has been reviewed and considered, and addended by me as needed.  Norberto Sorenson, MD MPH

## 2015-04-28 NOTE — Patient Instructions (Addendum)
You should start feeling much better by Monday/Tuesday next week. If you are not, come back and if you start feeling any worse, come back immediately.  Community-Acquired Pneumonia, Adult Pneumonia is an infection of the lungs. One type of pneumonia can happen while a person is in a hospital. A different type can happen when a person is not in a hospital (community-acquired pneumonia). It is easy for this kind to spread from person to person. It can spread to you if you breathe near an infected person who coughs or sneezes. Some symptoms include:  A dry cough.  A wet (productive) cough.  Fever.  Sweating.  Chest pain. HOME CARE  Take over-the-counter and prescription medicines only as told by your doctor.  Only take cough medicine if you are losing sleep.  If you were prescribed an antibiotic medicine, take it as told by your doctor. Do not stop taking the antibiotic even if you start to feel better.  Sleep with your head and neck raised (elevated). You can do this by putting a few pillows under your head, or you can sleep in a recliner.  Do not use tobacco products. These include cigarettes, chewing tobacco, and e-cigarettes. If you need help quitting, ask your doctor.  Drink enough water to keep your pee (urine) clear or pale yellow. A shot (vaccine) can help prevent pneumonia. Shots are often suggested for:  People older than 22 years of age.  People older than 22 years of age:  Who are having cancer treatment.  Who have long-term (chronic) lung disease.  Who have problems with their body's defense system (immune system). You may also prevent pneumonia if you take these actions:  Get the flu (influenza) shot every year.  Go to the dentist as often as told.  Wash your hands often. If soap and water are not available, use hand sanitizer. GET HELP IF:  You have a fever.  You lose sleep because your cough medicine does not help. GET HELP RIGHT AWAY IF:  You are  short of breath and it gets worse.  You have more chest pain.  Your sickness gets worse. This is very serious if:  You are an older adult.  Your body's defense system is weak.  You cough up blood.   This information is not intended to replace advice given to you by your health care provider. Make sure you discuss any questions you have with your health care provider.   Document Released: 10/09/2007 Document Revised: 01/11/2015 Document Reviewed: 08/17/2014 Elsevier Interactive Patient Education [employer]2016 [employer]

## 2015-04-29 LAB — COMPREHENSIVE METABOLIC PANEL
ALT: 14 U/L (ref 6–29)
AST: 17 U/L (ref 10–30)
Albumin: 3.9 g/dL (ref 3.6–5.1)
Alkaline Phosphatase: 56 U/L (ref 33–115)
BUN: 9 mg/dL (ref 7–25)
CHLORIDE: 105 mmol/L (ref 98–110)
CO2: 25 mmol/L (ref 20–31)
Calcium: 9.3 mg/dL (ref 8.6–10.2)
Creat: 0.69 mg/dL (ref 0.50–1.10)
GLUCOSE: 67 mg/dL (ref 65–99)
POTASSIUM: 3.9 mmol/L (ref 3.5–5.3)
Sodium: 139 mmol/L (ref 135–146)
Total Bilirubin: 0.2 mg/dL (ref 0.2–1.2)
Total Protein: 6.9 g/dL (ref 6.1–8.1)

## 2015-04-29 LAB — SEDIMENTATION RATE: Sed Rate: 36 mm/hr — ABNORMAL HIGH (ref 0–20)

## 2015-05-02 ENCOUNTER — Encounter: Payer: Self-pay | Admitting: Family Medicine

## 2015-05-10 ENCOUNTER — Ambulatory Visit (INDEPENDENT_AMBULATORY_CARE_PROVIDER_SITE_OTHER): Payer: BLUE CROSS/BLUE SHIELD | Admitting: Physician Assistant

## 2015-05-10 ENCOUNTER — Ambulatory Visit (INDEPENDENT_AMBULATORY_CARE_PROVIDER_SITE_OTHER): Payer: BLUE CROSS/BLUE SHIELD

## 2015-05-10 VITALS — BP 122/78 | HR 104 | Temp 98.4°F | Resp 16 | Ht 67.0 in | Wt 257.6 lb

## 2015-05-10 DIAGNOSIS — R05 Cough: Secondary | ICD-10-CM

## 2015-05-10 DIAGNOSIS — Z09 Encounter for follow-up examination after completed treatment for conditions other than malignant neoplasm: Secondary | ICD-10-CM

## 2015-05-10 LAB — POCT CBC
Granulocyte percent: 55.8 %G (ref 37–80)
HEMATOCRIT: 34.3 % — AB (ref 37.7–47.9)
Hemoglobin: 11.4 g/dL — AB (ref 12.2–16.2)
Lymph, poc: 3.4 (ref 0.6–3.4)
MCH: 25.9 pg — AB (ref 27–31.2)
MCHC: 33.1 g/dL (ref 31.8–35.4)
MCV: 78.1 fL — AB (ref 80–97)
MID (cbc): 0.6 (ref 0–0.9)
MPV: 8.6 fL (ref 0–99.8)
POC Granulocyte: 5.1 (ref 2–6.9)
POC LYMPH PERCENT: 37.9 %L (ref 10–50)
POC MID %: 6.3 % (ref 0–12)
Platelet Count, POC: 283 10*3/uL (ref 142–424)
RBC: 4.39 M/uL (ref 4.04–5.48)
RDW, POC: 15.8 %
WBC: 9.1 10*3/uL (ref 4.6–10.2)

## 2015-05-10 MED ORDER — IPRATROPIUM BROMIDE 0.02 % IN SOLN
0.5000 mg | Freq: Once | RESPIRATORY_TRACT | Status: AC
  Administered 2015-05-10: 0.5 mg via RESPIRATORY_TRACT

## 2015-05-10 MED ORDER — LEVONORGESTREL-ETHINYL ESTRAD 90-20 MCG PO TABS: 1.0000 | ORAL_TABLET | Freq: Every day | ORAL | Status: DC

## 2015-05-10 MED ORDER — ALBUTEROL SULFATE (2.5 MG/3ML) 0.083% IN NEBU
2.5000 mg | INHALATION_SOLUTION | Freq: Once | RESPIRATORY_TRACT | Status: AC
  Administered 2015-05-10: 2.5 mg via RESPIRATORY_TRACT

## 2015-05-10 NOTE — Progress Notes (Signed)
05/10/2015 8:50 PM   DOB: [birth year] / MRN: [id number]  SUBJECTIVE:  Kelli Howard is a 25 y.o. female presenting for follow up of walking pneumonia.  She was evaluated on 04/28/15 originally, and that HPI is as follows:   Kelli BartersJasmine D Howard is a 25 y.o. female who presents to [facility] complaining of flu-like symptoms, gradual onset 1 week ago. Pt reports that her symptoms started with cold-like symptoms that later developed to yellow colored productive cough, with symptoms of nausea, vomiting, sore throat neck stiffness, fever (last episode yesterday), night sweat, chest tightness, chest wall pain secondary to cough, shortness of breath, chest congestion, decreased in appetite, and fatigue. She states that she has not been able to eat or drink much during the past few days. Pt took Nyquil for the symptoms. She denies headache, urinary or bowel changes, or dysuria. She is UTD with the flu vaccine. Pt reports no history of asthma, URI, PNA, or smoking.   Pt reports no chance that she could be pregnant.  She was placed on Azithromycin and reports this helped but has not resolved her symptoms. She continues to have coughing spells.  She denies fever, chills, diaphoresis, and DOE. Overall feels somewhat better.       She is allergic to peanuts and banana.   She  has a past medical history of Enlarged heart; Vitamin D deficiency; Allergy; Anemia; and Heart murmur.    She  reports that she has never smoked. She has never used smokeless tobacco. She reports that she does not drink alcohol or use illicit drugs. She  reports that she does not engage in sexual activity. The patient  has past surgical history that includes Tonsillectomy and Dental surgery.  Her family history includes Heart disease in her sister; Hypertension in her mother and sister; Thyroid disease in her mother.  Review of Systems  Constitutional: Negative for fever and chills.  Eyes: Negative for blurred  vision.  Respiratory: Positive for cough and sputum production. Negative for hemoptysis, shortness of breath and wheezing.   Cardiovascular: Negative for chest pain.  Gastrointestinal: Negative for nausea and abdominal pain.  Genitourinary: Negative for dysuria, urgency and frequency.  Musculoskeletal: Negative for myalgias.  Skin: Negative for rash.  Neurological: Negative for dizziness, tingling and headaches.  Psychiatric/Behavioral: Negative for depression. The patient is not nervous/anxious.     Problem list and medications reviewed and updated by myself where necessary, and exist elsewhere in the encounter.   OBJECTIVE:  BP 122/78 mmHg  Pulse 104  Temp(Src) 98.4 F (36.9 C) (Oral)  Resp 16  Ht 5\' 7"  (1.702 m)  Wt 257 lb 9.6 oz (116.847 kg)  BMI 40.34 kg/m2  SpO2 97%  LMP 04/14/2015  Physical Exam  Constitutional: She is oriented to person, place, and time. She appears well-developed.  Eyes: EOM are normal. Pupils are equal, round, and reactive to light.  Cardiovascular: Regular rhythm, normal heart sounds and intact distal pulses.  Exam reveals no gallop and no friction rub.   No murmur heard. Pulmonary/Chest: Effort normal and breath sounds normal. No respiratory distress. She has no wheezes. She has no rales. She exhibits no tenderness.  Abdominal: Soft. She exhibits no distension.  Musculoskeletal: Normal range of motion.  Neurological: She is alert and oriented to person, place, and time. No cranial nerve deficit.  Skin: Skin is warm and dry. She is not diaphoretic.  Psychiatric: She has a normal mood and affect.  Vitals reviewed.  Results for orders placed or performed in visit on 05/10/15 (from the past 48 hour(s))  POCT CBC     Status: Abnormal   Collection Time: 05/10/15  8:41 PM  Result Value Ref Range   WBC 9.1 4.6 - 10.2 K/uL   Lymph, poc 3.4 0.6 - 3.4   POC LYMPH PERCENT 37.9 10 - 50 %L   MID (cbc) 0.6 0 - 0.9   POC MID % 6.3 0 - 12 %M   POC  Granulocyte 5.1 2 - 6.9   Granulocyte percent 55.8 37 - 80 %G   RBC 4.39 4.04 - 5.48 M/uL   Hemoglobin 11.4 (A) 12.2 - 16.2 g/dL   HCT, POC 16.1 (A) 09.6 - 47.9 %   MCV 78.1 (A) 80 - 97 fL   MCH, POC 25.9 (A) 27 - 31.2 pg   MCHC 33.1 31.8 - 35.4 g/dL   RDW, POC 04.5 %   Platelet Count, POC 283 142 - 424 K/uL   MPV 8.6 0 - 99.8 fL   [facility] reading (PRIMARY) by  PA Clark: Negative for pneumonia, please comment.   ASSESSMENT AND PLAN  [name] was seen today for follow-up.  Diagnoses and all orders for this visit:  Follow up: CBC and rads improved.  Will hold on further treatment for now.  Advised that she continue her rescue inhaler q4-6 at home until symptoms resolve.  If symptoms persist will consider dose pack.   -     POCT CBC -     DG Chest 2 View; Future -     albuterol (PROVENTIL) (2.5 MG/3ML) 0.083% nebulizer solution 2.5 mg; Take 3 mLs (2.5 mg total) by nebulization once. -     ipratropium (ATROVENT) nebulizer solution 0.5 mg; Take 2.5 mLs (0.5 mg total) by nebulization once.   The patient was advised to call or return to clinic if she does not see an improvement in symptoms or to seek the care of the closest emergency department if she worsens with the above plan.   Deliah Boston, MHS, PA-C [facility] 05/10/2015 8:50 PM

## 2015-09-22 ENCOUNTER — Ambulatory Visit (INDEPENDENT_AMBULATORY_CARE_PROVIDER_SITE_OTHER): Payer: BLUE CROSS/BLUE SHIELD | Admitting: Family Medicine

## 2015-09-22 VITALS — BP 120/84 | HR 96 | Temp 98.8°F | Resp 18 | Ht 67.0 in | Wt 260.0 lb

## 2015-09-22 DIAGNOSIS — Z888 Allergy status to other drugs, medicaments and biological substances status: Secondary | ICD-10-CM

## 2015-09-22 DIAGNOSIS — J069 Acute upper respiratory infection, unspecified: Secondary | ICD-10-CM | POA: Diagnosis not present

## 2015-09-22 DIAGNOSIS — L700 Acne vulgaris: Secondary | ICD-10-CM

## 2015-09-22 DIAGNOSIS — B9789 Other viral agents as the cause of diseases classified elsewhere: Principal | ICD-10-CM

## 2015-09-22 DIAGNOSIS — N6011 Diffuse cystic mastopathy of right breast: Secondary | ICD-10-CM | POA: Diagnosis not present

## 2015-09-22 DIAGNOSIS — N6012 Diffuse cystic mastopathy of left breast: Secondary | ICD-10-CM

## 2015-09-22 DIAGNOSIS — Z789 Other specified health status: Secondary | ICD-10-CM

## 2015-09-22 MED ORDER — TRETINOIN 0.025 % EX CREA
TOPICAL_CREAM | Freq: Every day | CUTANEOUS | Status: DC
Start: 2015-09-22 — End: 2015-10-17

## 2015-09-22 MED ORDER — AMOXICILLIN 875 MG PO TABS: 875.0000 mg | ORAL_TABLET | Freq: Two times a day (BID) | ORAL | Status: DC

## 2015-09-22 MED ORDER — NORETHIN ACE-ETH ESTRAD-FE 1.5-30 MG-MCG PO TABS: 1.0000 | ORAL_TABLET | Freq: Every day | ORAL | Status: DC

## 2015-09-22 NOTE — Patient Instructions (Addendum)
You likely have a viral illness, please use the following symptomatic measures and start antibiotic if you are not better in 3 days  For muscle aches, headache, sore throat you can take over the counter acetaminophen or ibuprofen as directed on the package For nasal congestion you can use Afrin nasal spray twice a day for up to 3 days, and /or sudafed, and/or saline nasal spray For cough you can use Delsym cough syrup Please come back to see us or go to the emergency department if you are not better in 5 to 7 days or if you develop fever over 101 for more than 48 hours or if you develop wheezing or shortness of breath.      IF you received an x-ray today, you will receive an invoice from [facility]. Please contact [facility] at [phone] with questions or concerns regarding your invoice.   IF you received labwork today, you will receive an invoice from [facility]. Please contact [facility] at [phone] with questions or concerns regarding your invoice.   Our billing staff will not be able to assist you with questions regarding bills from these companies.  You will be contacted with the lab results as soon as they are available. The fastest way to get your results is to activate your My Chart account. Instructions are located on the last page of this paperwork. If you have not heard from us regarding the results in 2 weeks, please contact this office.

## 2015-09-22 NOTE — Progress Notes (Signed)
Subjective:    Patient ID: Kelli Howard, female    DOB: 1990/06/04, 25 y.o.   MRN: [id number]  HPI This is a 25 yo female who presents today with 3 days of cough with yellow sputum, yellow nasal drainage, sore throat. No history of asthma, no smoking history. Was recently on vacation in [place].   Has been taking a continuous OCP but is dissatisfied with breakthrough bleeding and increased acne. She is requesting to go back to a 21 day pill so she will know when to expect her menses. She is in a same sex relationship.   She has had problems with acne on her face for several years. She has not gotten relief with adapalene-benzoyl peroxide 0.1-2.5% or otc preparations. She is currently washing with a salicylic acid wash.   She has noticed her breasts are "lumpy," and is concerned about whether or not this is normal. Occasional pain with menses. No nipple discharge. No family history of breast cancer.    Past Medical History  Diagnosis Date  . Enlarged heart   . Vitamin D deficiency   . Allergy   . Anemia   . Heart murmur    Past Surgical History  Procedure Laterality Date  . Tonsillectomy    . Dental surgery     Family History  Problem Relation Age of Onset  . Hypertension Mother   . Thyroid disease Mother   . Hypertension Sister   . Heart disease Sister    Social History  Substance Use Topics  . Smoking status: Never Smoker   . Smokeless tobacco: Never Used  . Alcohol Use: No      Review of Systems Per HPI    Objective:   Physical Exam  Constitutional: She is oriented to person, place, and time. She appears well-developed and well-nourished. No distress.  Morbidly obese  HENT:  Head: Normocephalic and atraumatic.  Right Ear: Tympanic membrane, external ear and ear canal normal.  Left Ear: Tympanic membrane, external ear and ear canal normal.  Nose: Mucosal edema present.  Mouth/Throat: Uvula is midline and oropharynx is clear and moist.  Cardiovascular:  Normal rate, regular rhythm and normal heart sounds.   Pulmonary/Chest: Effort normal and breath sounds normal. Right breast exhibits no inverted nipple, no mass, no nipple discharge, no skin change and no tenderness. Left breast exhibits no inverted nipple, no mass, no nipple discharge, no skin change and no tenderness. Breasts are symmetrical.  Large, pendulous breasts. Scattered areas thickening. No mass.   Neurological: She is alert and oriented to person, place, and time.  Skin: Skin is warm and dry. She is not diaphoretic.  Dense area closed comedones left jaw and lower cheek.   Psychiatric: She has a normal mood and affect. Her behavior is normal. Judgment normal.  Vitals reviewed.     BP 120/84 mmHg  Pulse 96  Temp(Src) 98.8 F (37.1 C) (Oral)  Resp 18  Ht 5\' 7"  (1.702 m)  Wt 260 lb (117.935 kg)  BMI 40.71 kg/m2  SpO2 100%  LMP  (LMP Unknown) Wt Readings from Last 3 Encounters:  09/22/15 260 lb (117.935 kg)  05/10/15 257 lb 9.6 oz (116.847 kg)  04/28/15 245 lb 12.8 oz (111.494 kg)       Assessment & Plan:  1. Viral URI with cough - Provided written and verbal information regarding diagnosis and treatment. - suspect viral etiology, provided written prescription if patient not improved in 3-5 days can start - amoxicillin (AMOXIL)  875 MG tablet; Take 1 tablet (875 mg total) by mouth 2 (two) times daily.  Dispense: 14 tablet; Refill: 0  2. Oral contraceptive intolerance - norethindrone-ethinyl estradiol-iron (MICROGESTIN FE,GILDESS FE,LOESTRIN FE) 1.5-30 MG-MCG tablet; Take 1 tablet by mouth daily.  Dispense: 1 Package; Refill: 12  3. Acne vulgaris - tretinoin (RETIN-A) 0.025 % cream; Apply topically at bedtime.  Dispense: 45 g; Refill: 0 - norethindrone-ethinyl estradiol-iron (MICROGESTIN FE,GILDESS FE,LOESTRIN FE) 1.5-30 MG-MCG tablet; Take 1 tablet by mouth daily.  Dispense: 1 Package; Refill: 12  4. Fibrocystic breast changes of both breasts - discussed with patient  and reviewed SBE and to return if worrisome findings   Olean Ree, FNP-BC  [facility]  09/23/2015 7:30 AM

## 2015-09-23 ENCOUNTER — Telehealth: Payer: Self-pay

## 2015-09-23 ENCOUNTER — Telehealth: Payer: Self-pay | Admitting: Emergency Medicine

## 2015-09-23 NOTE — Telephone Encounter (Signed)
Pt states that she didn't get the birth control she wanted  Called in she wanted yazmin  Best number [phone]

## 2015-09-23 NOTE — Telephone Encounter (Signed)
Pt did not get a birth control called in she wanted she wanted it to yazmin   Best number [phone]

## 2015-09-23 NOTE — Telephone Encounter (Signed)
Please review and advise.

## 2015-09-24 DIAGNOSIS — L709 Acne, unspecified: Secondary | ICD-10-CM | POA: Diagnosis not present

## 2015-09-24 DIAGNOSIS — Z30011 Encounter for initial prescription of contraceptive pills: Secondary | ICD-10-CM | POA: Diagnosis not present

## 2015-09-24 NOTE — Telephone Encounter (Signed)
Called and spoke to patient regarding OCP and reminded her of discussion we had while she was in the clinic concerning slight increased risk of VTE with Yasmin.

## 2015-10-17 ENCOUNTER — Other Ambulatory Visit: Payer: Self-pay | Admitting: Family Medicine

## 2016-01-03 ENCOUNTER — Ambulatory Visit (INDEPENDENT_AMBULATORY_CARE_PROVIDER_SITE_OTHER): Payer: BLUE CROSS/BLUE SHIELD | Admitting: Physician Assistant

## 2016-01-03 ENCOUNTER — Ambulatory Visit (INDEPENDENT_AMBULATORY_CARE_PROVIDER_SITE_OTHER): Payer: BLUE CROSS/BLUE SHIELD

## 2016-01-03 VITALS — BP 120/80 | HR 112 | Temp 98.8°F | Resp 17 | Ht 67.0 in | Wt 267.0 lb

## 2016-01-03 DIAGNOSIS — R059 Cough, unspecified: Secondary | ICD-10-CM

## 2016-01-03 DIAGNOSIS — R05 Cough: Secondary | ICD-10-CM

## 2016-01-03 LAB — POCT CBC
Granulocyte percent: 61.6 %G (ref 37–80)
HEMATOCRIT: 36.7 % — AB (ref 37.7–47.9)
Hemoglobin: 12.7 g/dL (ref 12.2–16.2)
Lymph, poc: 3.1 (ref 0.6–3.4)
MCH: 28.3 pg (ref 27–31.2)
MCHC: 34.6 g/dL (ref 31.8–35.4)
MCV: 81.7 fL (ref 80–97)
MID (CBC): 0.6 (ref 0–0.9)
MPV: 8.4 fL (ref 0–99.8)
POC Granulocyte: 5.9 (ref 2–6.9)
POC LYMPH %: 31.9 % (ref 10–50)
POC MID %: 6.5 % (ref 0–12)
Platelet Count, POC: 278 10*3/uL (ref 142–424)
RBC: 4.49 M/uL (ref 4.04–5.48)
RDW, POC: 14.1 %
WBC: 9.6 10*3/uL (ref 4.6–10.2)

## 2016-01-03 MED ORDER — AZITHROMYCIN 250 MG PO TABS: ORAL_TABLET | ORAL | 0 refills | Status: DC

## 2016-01-03 NOTE — Progress Notes (Signed)
Kelli BartersJasmine D Howard  MRN: [id number] DOB: April 28, 1991  Subjective:  Kelli Howard is a 25 y.o. female seen in office today for a chief complaint of productive cough x 3 weeks. Illness started out with head cold for four days then it progressed to tightness in chest and sore throat. The first week she had clear mucus production when she coughed but the second week it turned itno dark yellow/brown globules.  Had associated fever (101 last night) and SOB. Has tried dayquil, nyquil, and robitussin for three weeks with no relief. Feels considerably worse. Had pneumonia in December 2016. Patient is not sexually active.   Review of Systems  Constitutional: Positive for appetite change (decreased), diaphoresis and fatigue.  HENT: Positive for congestion, ear pain ( trouble hearing) and sneezing. Negative for sinus pressure.   Eyes: Negative for pain.  Respiratory: Positive for wheezing.   Gastrointestinal: Positive for diarrhea. Negative for nausea and vomiting.  Musculoskeletal: Positive for myalgias.  Neurological: Positive for dizziness, light-headedness and headaches.  Hematological: Positive for adenopathy.    There are no active problems to display for this patient.   Current Outpatient Prescriptions on File Prior to Visit  Medication Sig Dispense Refill  . Adapalene-Benzoyl Peroxide 0.1-2.5 % gel Apply 1 application topically at bedtime. Reported on 05/10/2015    . Albuterol Sulfate (PROAIR RESPICLICK) 108 (90 BASE) MCG/ACT AEPB Inhale 2 puffs into the lungs every 4 (four) hours as needed. 1 each 2  . EPINEPHrine (EPIPEN) 0.3 mg/0.3 mL IJ SOAJ injection Inject 0.3 mLs (0.3 mg total) into the muscle as needed. 1 Device 3  . ibuprofen (ADVIL,MOTRIN) 200 MG tablet Take 400 mg by mouth every 4 (four) hours as needed for moderate pain. Reported on 05/10/2015    . tretinoin (RETIN-A) 0.025 % cream APPLY TOPICALLY AT BEDTIME. 45 g 10  . Vitamin D, Ergocalciferol, (DRISDOL) 50000 UNITS CAPS capsule  Take 50,000 Units by mouth 2 (two) times a week. Reported on 04/28/2015    . hydroquinone 4 % cream   0   No current facility-administered medications on file prior to visit.     Allergies  Allergen Reactions  . Peanuts [Peanut Oil] Anaphylaxis    Also: tree nuts   . Other Rash    mushrooms  . Banana     Unknown reaction     Objective:  BP 120/80 (BP Location: Right Arm, Patient Position: Sitting, Cuff Size: Normal)   Pulse (!) 112   Temp 98.8 F (37.1 C) (Oral)   Resp 17   Ht 5\' 7"  (1.702 m)   Wt 267 lb (121.1 kg)   LMP  (LMP Unknown)   SpO2 96%   BMI 41.82 kg/m   Physical Exam  Constitutional: She is oriented to person, place, and time.  Well developed, well nourished, appears uncomfortable sitting on exam table.   HENT:  Head: Normocephalic and atraumatic.  Nose: Mucosal edema ( more prominent in right nostril) present. Right sinus exhibits no maxillary sinus tenderness and no frontal sinus tenderness. Left sinus exhibits no maxillary sinus tenderness and no frontal sinus tenderness.  Mouth/Throat: Uvula is midline and mucous membranes are normal. Posterior oropharyngeal erythema present.  Eyes: Conjunctivae are normal.  Neck: Normal range of motion.  Pulmonary/Chest: Effort normal. Apnea noted. She has rhonchi ( cleared with cough) in the right lower field and the left lower field.  Lymphadenopathy:       Head (right side): Posterior auricular adenopathy present. No submental, no submandibular, no tonsillar,  no preauricular and no occipital adenopathy present.       Head (left side): No submental, no submandibular, no tonsillar, no preauricular, no posterior auricular and no occipital adenopathy present.    She has cervical adenopathy.       Right cervical: Deep cervical adenopathy present. No superficial cervical and no posterior cervical adenopathy present.      Left cervical: No superficial cervical, no deep cervical and no posterior cervical adenopathy present.        Right: No supraclavicular adenopathy present.       Left: No supraclavicular adenopathy present.  Neurological: She is alert and oriented to person, place, and time. Gait normal.  Skin: Skin is warm and dry.  Psychiatric: Affect normal.  Vitals reviewed.   Pulse Readings from Last 3 Encounters:  01/03/16 (!) 112  09/22/15 96  05/10/15 (!) 104   Results for orders placed or performed in visit on 01/03/16 (from the past 24 hour(s))  POCT CBC     Status: Abnormal   Collection Time: 01/03/16  6:24 PM  Result Value Ref Range   WBC 9.6 4.6 - 10.2 K/uL   Lymph, poc 3.1 0.6 - 3.4   POC LYMPH PERCENT 31.9 10 - 50 %L   MID (cbc) 0.6 0 - 0.9   POC MID % 6.5 0 - 12 %M   POC Granulocyte 5.9 2 - 6.9   Granulocyte percent 61.6 37 - 80 %G   RBC 4.49 4.04 - 5.48 M/uL   Hemoglobin 12.7 12.2 - 16.2 g/dL   HCT, POC 81.1 (A) 91.4 - 47.9 %   MCV 81.7 80 - 97 fL   MCH, POC 28.3 27 - 31.2 pg   MCHC 34.6 31.8 - 35.4 g/dL   RDW, POC 78.2 %   Platelet Count, POC 278 142 - 424 K/uL   MPV 8.4 0 - 99.8 fL   Dg Chest 2 View  Result Date: 01/03/2016 CLINICAL DATA:  Cough, fever, and fatigue for 3 weeks. EXAM: CHEST  2 VIEW COMPARISON:  Two-view chest x-ray 05/10/2015 FINDINGS: The heart size is normal. The lungs are clear. No significant edema or effusion is present. The visualized soft tissues and bony thorax are unremarkable. IMPRESSION: Negative two view chest x-ray. Electronically Signed   By: Marin Roberts M.D.   On: 01/03/2016 18:07    Assessment and Plan :  1. Cough -Will treat with antibiotics due to  progressive worsening of symptoms and physical exam findings.  - POCT CBC - DG Chest 2 View; Future - azithromycin (ZITHROMAX) 250 MG tablet; Take 2 tabs PO x 1 dose, then 1 tab PO QD x 4 days  Dispense: 6 tablet; Refill: 0  Benjiman Core PA-C  [facility] 01/03/2016 5:03 PM

## 2016-01-03 NOTE — Patient Instructions (Addendum)
  Continue to get plenty of rest and drink lots of water. Take antibiotic as prescribed. Follow up if no improvement.     IF you received an x-ray today, you will receive an invoice from [facility]. Please contact [facility] at [phone] with questions or concerns regarding your invoice.   IF you received labwork today, you will receive an invoice from [facility]. Please contact [facility] at [phone] with questions or concerns regarding your invoice.   Our billing staff will not be able to assist you with questions regarding bills from these companies.  You will be contacted with the lab results as soon as they are available. The fastest way to get your results is to activate your My Chart account. Instructions are located on the last page of this paperwork. If you have not heard from us regarding the results in 2 weeks, please contact this office.

## 2016-01-06 ENCOUNTER — Telehealth: Payer: Self-pay

## 2016-01-06 NOTE — Telephone Encounter (Signed)
Pt states she is still producing yellow phleam/ she would like more antibiotics// please call to consult   [phone]

## 2016-02-04 DIAGNOSIS — N390 Urinary tract infection, site not specified: Secondary | ICD-10-CM | POA: Diagnosis not present

## 2016-02-04 DIAGNOSIS — N771 Vaginitis, vulvitis and vulvovaginitis in diseases classified elsewhere: Secondary | ICD-10-CM | POA: Diagnosis not present

## 2016-02-06 DIAGNOSIS — R109 Unspecified abdominal pain: Secondary | ICD-10-CM | POA: Diagnosis not present

## 2016-02-06 DIAGNOSIS — N760 Acute vaginitis: Secondary | ICD-10-CM | POA: Diagnosis not present

## 2016-02-06 DIAGNOSIS — R110 Nausea: Secondary | ICD-10-CM | POA: Diagnosis not present

## 2016-02-08 DIAGNOSIS — R1031 Right lower quadrant pain: Secondary | ICD-10-CM | POA: Diagnosis not present

## 2016-05-29 ENCOUNTER — Ambulatory Visit (INDEPENDENT_AMBULATORY_CARE_PROVIDER_SITE_OTHER): Payer: BLUE CROSS/BLUE SHIELD | Admitting: Family Medicine

## 2016-05-29 VITALS — BP 126/80 | HR 99 | Temp 98.7°F | Resp 16 | Ht 67.0 in | Wt 274.6 lb

## 2016-05-29 DIAGNOSIS — F330 Major depressive disorder, recurrent, mild: Secondary | ICD-10-CM | POA: Diagnosis not present

## 2016-05-29 DIAGNOSIS — Z91010 Allergy to peanuts: Secondary | ICD-10-CM

## 2016-05-29 DIAGNOSIS — F411 Generalized anxiety disorder: Secondary | ICD-10-CM

## 2016-05-29 DIAGNOSIS — L819 Disorder of pigmentation, unspecified: Secondary | ICD-10-CM

## 2016-05-29 MED ORDER — ESCITALOPRAM OXALATE 20 MG PO TABS: 20.0000 mg | ORAL_TABLET | Freq: Every day | ORAL | 0 refills | Status: DC

## 2016-05-29 MED ORDER — EPINEPHRINE 0.3 MG/0.3ML IJ SOAJ: 0.3000 mg | INTRAMUSCULAR | 12 refills | Status: DC | PRN

## 2016-05-29 MED ORDER — ONDANSETRON 8 MG PO TBDP: 8.0000 mg | ORAL_TABLET | Freq: Three times a day (TID) | ORAL | 0 refills | Status: DC | PRN

## 2016-05-29 MED ORDER — HYDROQUINONE 4 % EX CREA: TOPICAL_CREAM | Freq: Every evening | CUTANEOUS | 1 refills | Status: DC | PRN

## 2016-05-29 NOTE — Progress Notes (Signed)
Patient ID: Kelli Howard, female    DOB: 10/27/1990, 26 y.o.   MRN: [id number]  PCP: No PCP Per Patient  Chief Complaint  Patient presents with  . Depression    score during triage 18  . Medication Refill    Epi-Pen, Hydroquinone 4%    Subjective:  HPI 26 year old female presents for evaluation of depression and medication refills. Depression Reports original anxiety and depression dx at age 6 years old. Was placed on Zoloft. Stopped Zoloft in 12th grade as depression symptoms became manageable. Upon transitioning to college depression, anxiety with panic attacks. She resumed Zoloft at max dose of 100 mg daily. Her prior provider decreased her dose of Zoloft 25 mg per day. Since taking Zoloft she reports gaining over 30 lbs, feeling more depressed, and overall fells Zoloft is no longer working and would like to change medications. Denies any current thoughts or plans of suicide. Reports last year she battle with suicidal thoughts although reports no attempts or plans for suicide in her hx. Denies any behavioral health admissions. Reports that she had seen several behavioral health counselors and was under the care of a psychiatric, and felt that the sessions were not beneficial and often caused her to feel more uncomfortable. She admits that she is very uncomfortable talking about her feelings.  Discoloration of skin on face Previously under the care of a dermatologist treatment of chronic acne and skin discoloration. Requesting a refill on hydroquione cream for face.  Allergies  Highly allergic to peanuts and peanut oil. She works in [employer] and would like an extra Epi-pen to have available at work.    Social History   Social History  . Marital status: Single    Spouse name: N/A  . Number of children: N/A  . Years of education: N/A   Occupational History  . Not on file.   Social History Main Topics  . Smoking status: Never Smoker  . Smokeless tobacco: Never Used    . Alcohol use No  . Drug use: No  . Sexual activity: No   Other Topics Concern  . Not on file   Social History Narrative  . No narrative on file    Family History  Problem Relation Age of Onset  . Hypertension Mother   . Thyroid disease Mother   . Hypertension Sister   . Heart disease Sister    Review of Systems See HPI  Allergies  Allergen Reactions  . Peanuts [Peanut Oil] Anaphylaxis    Also: tree nuts   . Other Rash    mushrooms  . Banana     Unknown reaction     Prior to Admission medications   Medication Sig Start Date End Date Taking? Authorizing Provider  Drospirenone-Ethinyl Estradiol-Levomefol (BEYAZ) 3-0.02-0.451 MG tablet Take 1 tablet by mouth daily.   Yes Historical Provider, MD  EPINEPHrine (EPIPEN) 0.3 mg/0.3 mL IJ SOAJ injection Inject 0.3 mLs (0.3 mg total) into the muscle as needed. 11/11/13  Yes Azalia Bilis, MD  hydroquinone 4 % cream  07/29/15  Yes Historical Provider, MD  ibuprofen (ADVIL,MOTRIN) 200 MG tablet Take 400 mg by mouth every 4 (four) hours as needed for moderate pain. Reported on 05/10/2015   Yes Historical Provider, MD  tretinoin (RETIN-A) 0.025 % cream APPLY TOPICALLY AT BEDTIME. 10/19/15  Yes Emi Belfast, FNP  Vitamin D, Ergocalciferol, (DRISDOL) 50000 UNITS CAPS capsule Take 50,000 Units by mouth 2 (two) times a week. Reported on 04/28/2015  Yes Historical Provider, MD  Adapalene-Benzoyl Peroxide 0.1-2.5 % gel Apply 1 application topically at bedtime. Reported on 05/10/2015    Historical Provider, MD  Albuterol Sulfate (PROAIR RESPICLICK) 108 (90 BASE) MCG/ACT AEPB Inhale 2 puffs into the lungs every 4 (four) hours as needed. Patient not taking: Reported on 05/29/2016 04/28/15   Sherren MochaEva N Shaw, MD    Past Medical, Surgical Family and Social History reviewed and updated.    Objective:   Today's Vitals   05/29/16 1324  BP: 126/80  Pulse: 99  Resp: 16  Temp: 98.7 F (37.1 C)  TempSrc: Oral  SpO2: 98%  Weight: 274 lb 9.6 oz  (124.6 kg)  Height: 5\' 7"  (1.702 m)    Wt Readings from Last 3 Encounters:  05/29/16 274 lb 9.6 oz (124.6 kg)  01/03/16 267 lb (121.1 kg)  09/22/15 260 lb (117.9 kg)   Physical Exam  Constitutional: She is oriented to person, place, and time. She appears well-developed and well-nourished.  Cardiovascular: Normal rate, regular rhythm, normal heart sounds and intact distal pulses.   Pulmonary/Chest: Effort normal and breath sounds normal.  Neurological: She is alert and oriented to person, place, and time.  Skin: Skin is warm and dry.  Psychiatric: She has a normal mood and affect. Her speech is normal and behavior is normal. Judgment and thought content normal. Cognition and memory are normal.     Assessment & Plan:  1. Mild episode of recurrent major depressive disorder (HCC) 2. GAD (generalized anxiety disorder) 3. Discoloration of skin on face 4. Allergy to peanuts  Plan: -Start escitalopram 20 mg at bedtime. -Take ondansetron (Zofran) 8 mg every 8 hours PRN for anxiety related nausea. -Return for follow-up 3-4 weeks. -Upon improvement of mood, resume working out at least 2-3 days per week to facilitate weight loss. -Consider checking thyroid function at some point due to weight gain and chronic  Depression. -Discussed and patient agreed to [facility]Ambulatory referral placed for  -Refilled prescription for Epi-Pen and hydroquinone cream    Godfrey PickKimberly S. Tuck Dulworth, MSN, FNP-C [facility] [phone]  Total of 30 minutes spent with pt., greater than 50% which was spent in discussion of tx options of depression and anxiety.

## 2016-05-29 NOTE — Patient Instructions (Addendum)
Escitalopram 20 mg once daily for depression and anxiety.  IF you received an x-ray today, you will receive an invoice from [facility]. Please contact [facility] at [phone] with questions or concerns regarding your invoice.   IF you received labwork today, you will receive an invoice from [facility]. Please contact [facility] at [phone] with questions or concerns regarding your invoice.   Our billing staff will not be able to assist you with questions regarding bills from these companies.  You will be contacted with the lab results as soon as they are available. The fastest way to get your results is to activate your My Chart account. Instructions are located on the last page of this paperwork. If you have not heard from us regarding the results in 2 weeks, please contact this office.     Persistent Depressive Disorder Persistent depressive disorder (PDD) is a mental health condition. PDD causes symptoms of low-level depression for 2 years or longer. It may also be called long-term (chronic) depression or dysthymia. PDD may include episodes of more severe depression that last for about 2 weeks (major depressive disorder or MDD). PDD can affect the way you think, feel, and sleep. This condition may also affect your relationships. You may be more likely to get sick if you have PDD. Symptoms of PDD occur for most of the day and may include:  Feeling tired (fatigue).  Low energy.  Eating too much or too little.  Sleeping too much or too little.  Feeling restless or agitated.  Feeling hopeless.  Feeling worthless or guilty.  Feeling worried or nervous (anxiety).  Trouble concentrating or making decisions.  Low self-esteem.  A negative way of looking at things (outlook).  Not being able to have fun or feel pleasure.  Avoiding interacting with people.  Getting angry or annoyed easily (irritability).  Acting aggressive or angry. Follow these  instructions at home: Activity  Go back to your normal activities as told by your doctor.  Exercise regularly as told by your doctor. General instructions  Take over-the-counter and prescription medicines only as told by your doctor.  Do not drink alcohol. Or, limit how much alcohol you drink to no more than 1 drink a day for nonpregnant women and 2 drinks a day for men. One drink equals 12 oz of beer, 5 oz of wine, or 1 oz of hard liquor. Alcohol can affect any antidepressant medicines you are taking. Talk with your doctor about your alcohol use.  Eat a healthy diet and get plenty of sleep.  Find activities that you enjoy each day.  Consider joining a support group. Your doctor may be able to suggest a support group.  Keep all follow-up visits as told by your doctor. This is important. Where to find more information: [employer] Mental [employer]ness  www.nami.org [employer] Mental [employer]  http://www.maynard.net/www.nimh.nih.gov National Suicide Prevention Lifeline  320-531-96031-800-273-TALK (470)426-7009(1-218-711-8693). This is free, 24-hour help. Contact a doctor if:  Your symptoms get worse.  You have new symptoms.  You have trouble sleeping or doing your daily activities. Get help right away if:  You self-harm.  You have serious thoughts about hurting yourself or others.  You see, hear, taste, smell, or feel things that are not there (hallucinate). This information is not intended to replace advice given to you by your health care provider. Make sure you discuss any questions you have with your health care provider. Document Released: 04/03/2015 Document Revised: 12/15/2015 Document Reviewed: 12/15/2015 Elsevier Interactive  Patient Education  2017 Elsevier Inc.  

## 2016-05-30 ENCOUNTER — Telehealth: Payer: Self-pay

## 2016-05-30 NOTE — Telephone Encounter (Signed)
PATIENT STATES SHE WAS IN THE OFFICE AND [facility]W [facility] Wednesday (05/29/16). SHE FOR GOT TO ASK HER TO WRITE HER PRESCRIPTION FOR HER EPINEPHRINE (EPIPEN). SHE SAID IT NEEDS TO SAY SHE GETS THE BRAND NAME ONLY IN ORDER FOR HER INSURANCE TO PAY FOR IT. BEST PHONE [phone] (CELL)  PHARMACY CHOICE IS [facility] ON [address]. MBC

## 2016-05-31 MED ORDER — EPINEPHRINE 0.3 MG/0.3ML IJ SOAJ: 0.3000 mg | INTRAMUSCULAR | 12 refills | Status: DC | PRN

## 2016-05-31 NOTE — Telephone Encounter (Signed)
Brand only added to rx

## 2016-06-11 ENCOUNTER — Ambulatory Visit (INDEPENDENT_AMBULATORY_CARE_PROVIDER_SITE_OTHER): Payer: BLUE CROSS/BLUE SHIELD | Admitting: Family Medicine

## 2016-06-11 VITALS — BP 114/58 | HR 87 | Temp 98.7°F | Resp 16 | Ht 67.0 in | Wt 276.8 lb

## 2016-06-11 DIAGNOSIS — J069 Acute upper respiratory infection, unspecified: Secondary | ICD-10-CM

## 2016-06-11 DIAGNOSIS — F418 Other specified anxiety disorders: Secondary | ICD-10-CM | POA: Diagnosis not present

## 2016-06-11 MED ORDER — ESCITALOPRAM OXALATE 20 MG PO TABS: 20.0000 mg | ORAL_TABLET | Freq: Every day | ORAL | 2 refills | Status: DC

## 2016-06-11 MED ORDER — BENZONATATE 100 MG PO CAPS: 100.0000 mg | ORAL_CAPSULE | Freq: Three times a day (TID) | ORAL | 0 refills | Status: DC | PRN

## 2016-06-11 MED ORDER — IPRATROPIUM BROMIDE 0.03 % NA SOLN: 2.0000 | Freq: Two times a day (BID) | NASAL | 0 refills | Status: DC

## 2016-06-11 MED ORDER — ESCITALOPRAM OXALATE 10 MG PO TABS: 10.0000 mg | ORAL_TABLET | Freq: Every day | ORAL | 3 refills | Status: DC

## 2016-06-11 NOTE — Progress Notes (Signed)
Patient ID: Kelli Howard, female    DOB: 1991-03-01, 26 y.o.   MRN: [id number]  PCP: No PCP Per Patient  Chief Complaint  Patient presents with  . Cough    yellow plegm x 2 days, ear pain, headache,    Subjective:  HPI 26 year old female presents for evaluation of a cough, ear pain with headache x 2 days. We will also include within this visit her depression follow-up.  Upper Respiratory Symptoms  Pt reports a 2 days hx of right sided headache, more pronounced behind the right ear. Swollen right post auricular lymph node and left sided cervical lymph nodes swollen and non-tender. Hx of chronic otis media as a child. Cough developed yesterday with yellow sputum production. Denies nasal congestion or facial tenderness. No wheezing or shortness of breath.  Depression and Anxiety Follow-up Patient reports moderate improvement of mood, concentration, focus, and energy.Tolerating escitalopram. Main concern is that medication appears to wear off early to mid afternoon. Denies GI symptoms. Medication causes drowsiness and therefore she takes her medication at bedtime.   Social History   Social History  . Marital status: Single    Spouse name: N/A  . Number of children: N/A  . Years of education: N/A   Occupational History  . Not on file.   Social History Main Topics  . Smoking status: Never Smoker  . Smokeless tobacco: Never Used  . Alcohol use No  . Drug use: No  . Sexual activity: No   Other Topics Concern  . Not on file   Social History Narrative  . No narrative on file    Family History  Problem Relation Age of Onset  . Hypertension Mother   . Thyroid disease Mother   . Hypertension Sister   . Heart disease Sister    Review of Systems See HPI There are no active problems to display for this patient.   Allergies  Allergen Reactions  . Peanuts [Peanut Oil] Anaphylaxis    Also: tree nuts   . Other Rash    mushrooms  . Banana     Unknown reaction       Prior to Admission medications   Medication Sig Start Date End Date Taking? Authorizing Provider  Adapalene-Benzoyl Peroxide 0.1-2.5 % gel Apply 1 application topically at bedtime. Reported on 05/10/2015   Yes Historical Provider, MD  Albuterol Sulfate (PROAIR RESPICLICK) 108 (90 BASE) MCG/ACT AEPB Inhale 2 puffs into the lungs every 4 (four) hours as needed. 04/28/15  Yes Sherren Mocha, MD  Drospirenone-Ethinyl Estradiol-Levomefol (BEYAZ) 3-0.02-0.451 MG tablet Take 1 tablet by mouth daily.   Yes Historical Provider, MD  EPINEPHrine (EPIPEN) 0.3 mg/0.3 mL IJ SOAJ injection Inject 0.3 mLs (0.3 mg total) into the muscle as needed. Brand only 05/31/16  Yes Doyle Askew, FNP  escitalopram (LEXAPRO) 20 MG tablet Take 1 tablet (20 mg total) by mouth at bedtime. 05/29/16  Yes Doyle Askew, FNP  hydroquinone 4 % cream Apply topically at bedtime as needed. 05/29/16  Yes Doyle Askew, FNP  ibuprofen (ADVIL,MOTRIN) 200 MG tablet Take 400 mg by mouth every 4 (four) hours as needed for moderate pain. Reported on 05/10/2015   Yes Historical Provider, MD  ondansetron (ZOFRAN-ODT) 8 MG disintegrating tablet Take 1 tablet (8 mg total) by mouth every 8 (eight) hours as needed for nausea. 05/29/16  Yes Doyle Askew, FNP  tretinoin (RETIN-A) 0.025 % cream APPLY TOPICALLY AT BEDTIME. 10/19/15  Yes Emi Belfast, FNP  Vitamin D, Ergocalciferol, (DRISDOL) 50000 UNITS CAPS capsule Take 50,000 Units by mouth 2 (two) times a week. Reported on 04/28/2015   Yes Historical Provider, MD    Past Medical, Surgical Family and Social History reviewed and updated.    Objective:   Today's Vitals   06/11/16 1008  BP: (!) 114/58  Pulse: 87  Resp: 16  Temp: 98.7 F (37.1 C)  TempSrc: Oral  SpO2: 98%  Weight: 276 lb 12.8 oz (125.6 kg)  Height: 5\' 7"  (1.702 m)    Wt Readings from Last 3 Encounters:  06/11/16 276 lb 12.8 oz (125.6 kg)  05/29/16 274 lb 9.6 oz (124.6 kg)   01/03/16 267 lb (121.1 kg)   Physical Exam  Constitutional: She is oriented to person, place, and time. She appears well-developed and well-nourished.  HENT:  Head: Normocephalic and atraumatic.  Right Ear: Hearing normal. There is tenderness. No middle ear effusion.  Left Ear: Hearing normal. There is tenderness. A middle ear effusion is present.  Nose: Mucosal edema and rhinorrhea present.  Mouth/Throat: Oropharynx is clear and moist.  Neck: Normal range of motion. Neck supple.  Cardiovascular: Normal rate, regular rhythm, normal heart sounds and intact distal pulses.   Pulmonary/Chest: Effort normal and breath sounds normal. She has no wheezes. She has no rales.  Musculoskeletal: Normal range of motion.  Neurological: She is alert and oriented to person, place, and time.  Skin: Skin is warm and dry.  Psychiatric: She has a normal mood and affect. Her behavior is normal. Judgment and thought content normal.      Assessment & Plan:  1. Acute upper respiratory infection 2. Depression with anxiety  Plan: -Increase Escitalopram 20 mg to 30 mg at bedtime daily -Provided a referral to [facility] in [place] as first available appointment at [facility] in April. [place], [place] --[phone] Ask for an appointment with Maralyn SagoSarah or Corrie DandyMary  -Start Zyrtec 10 mg at bedtime regardless of symptoms -Take Benzonate 100-200 mg up to 3 times daily for cough  Total of 20 minutes  spent with pt., greater than 50% which was spent in discussion of treatment and counseling options for depression and anxiety.  -Depression and Anxiety follow-up in April   Romie Tay S. Tiburcio PeaHarris, MSN, FNP-C [facility] at [facility] [phone]

## 2016-06-11 NOTE — Patient Instructions (Addendum)
[facility]  [place], [place] --[phone] Ask for an appointment with Maralyn SagoSarah or Corrie DandyMary  Cough and ear symptoms: Start Zyrtec 10 mg at bedtime regardless of symptoms Take Benzonate 100-200 mg up to 3 times daily for cough  Depression Increase Lexapro form 20 mg to 30 mg. You will have two prescriptions: 20 mg and 10 mg.  Return for follow-up in April with me.   IF you received an x-ray today, you will receive an invoice from [facility]. Please contact [facility] at [phone] with questions or concerns regarding your invoice.   IF you received labwork today, you will receive an invoice from [facility]. Please contact [facility] at [phone] with questions or concerns regarding your invoice.   Our billing staff will not be able to assist you with questions regarding bills from these companies.  You will be contacted with the lab results as soon as they are available. The fastest way to get your results is to activate your My Chart account. Instructions are located on the last page of this paperwork. If you have not heard from us regarding the results in 2 weeks, please contact this office.    Otitis Media With Effusion Otitis media with effusion is the presence of fluid in the middle ear. This is a common problem in children, which often follows ear infections. It may be present for weeks or longer after the infection. Unlike an acute ear infection, otitis media with effusion refers only to fluid behind the ear drum and not infection. Children with repeated ear and sinus infections and allergy problems are the most likely to get otitis media with effusion. CAUSES  The most frequent cause of the fluid buildup is dysfunction of the eustachian tubes. These are the tubes that drain fluid in the ears to the back of the nose (nasopharynx). SYMPTOMS   The main symptom of this condition is hearing loss. As a result, you or your child may:  Listen to the TV at  a loud volume.  Not respond to questions.  Ask "what" often when spoken to.  Mistake or confuse one sound or word for another.  There may be a sensation of fullness or pressure but usually not pain. DIAGNOSIS   Your health care provider will diagnose this condition by examining you or your child's ears.  Your health care provider may test the pressure in you or your child's ear with a tympanometer.  A hearing test may be conducted if the problem persists. TREATMENT   Treatment depends on the duration and the effects of the effusion.  Antibiotics, decongestants, nose drops, and cortisone-type drugs (tablets or nasal spray) may not be helpful.  Children with persistent ear effusions may have delayed language or behavioral problems. Children at risk for developmental delays in hearing, learning, and speech may require referral to a specialist earlier than children not at risk.  You or your child's health care provider may suggest a referral to an ear, nose, and throat surgeon for treatment. The following may help restore normal hearing:  Drainage of fluid.  Placement of ear tubes (tympanostomy tubes).  Removal of adenoids (adenoidectomy). HOME CARE INSTRUCTIONS   Avoid secondhand smoke.  Infants who are breastfed are less likely to have this condition.  Avoid feeding infants while they are lying flat.  Avoid known environmental allergens.  Avoid people who are sick. SEEK MEDICAL CARE IF:   Hearing is not better in 3 months.  Hearing is worse.  Ear pain.  Drainage  from the ear.  Dizziness. MAKE SURE YOU:   Understand these instructions.  Will watch your condition.  Will get help right away if you are not doing well or get worse. This information is not intended to replace advice given to you by your health care provider. Make sure you discuss any questions you have with your health care provider. Document Released: 05/30/2004 Document Revised: 05/13/2014  Document Reviewed: 11/17/2012 Elsevier Interactive Patient Education  2017 [employer].

## 2016-06-19 ENCOUNTER — Ambulatory Visit: Payer: BLUE CROSS/BLUE SHIELD | Admitting: Family Medicine

## 2016-07-08 ENCOUNTER — Other Ambulatory Visit: Payer: Self-pay | Admitting: Family Medicine

## 2016-07-09 ENCOUNTER — Ambulatory Visit (INDEPENDENT_AMBULATORY_CARE_PROVIDER_SITE_OTHER): Payer: BLUE CROSS/BLUE SHIELD

## 2016-07-09 ENCOUNTER — Ambulatory Visit (INDEPENDENT_AMBULATORY_CARE_PROVIDER_SITE_OTHER): Payer: BLUE CROSS/BLUE SHIELD | Admitting: Family Medicine

## 2016-07-09 VITALS — BP 122/70 | HR 84 | Temp 99.1°F | Resp 18 | Ht 67.0 in | Wt 277.0 lb

## 2016-07-09 DIAGNOSIS — B379 Candidiasis, unspecified: Secondary | ICD-10-CM | POA: Diagnosis not present

## 2016-07-09 DIAGNOSIS — S4992XA Unspecified injury of left shoulder and upper arm, initial encounter: Secondary | ICD-10-CM | POA: Diagnosis not present

## 2016-07-09 DIAGNOSIS — M25512 Pain in left shoulder: Secondary | ICD-10-CM | POA: Diagnosis not present

## 2016-07-09 LAB — POCT WET + KOH PREP
TRICH BY WET PREP: ABSENT
Yeast by KOH: ABSENT

## 2016-07-09 MED ORDER — MELOXICAM 15 MG PO TABS: 7.5000 mg | ORAL_TABLET | Freq: Every day | ORAL | 0 refills | Status: DC

## 2016-07-09 MED ORDER — FLUCONAZOLE 150 MG PO TABS
150.0000 mg | ORAL_TABLET | Freq: Once | ORAL | 0 refills | Status: AC
Start: 2016-07-09 — End: 2016-07-09

## 2016-07-09 NOTE — Patient Instructions (Addendum)
Diflucan 150 mg once daily may repeat in 3 days if yeast vaginal itching doesn't improve.  For shoulder pain start Meloxicam 7.5-15 mg once daily.  IF you received an x-ray today, you will receive an invoice from [facility]. Please contact [facility] at [phone] with questions or concerns regarding your invoice.   IF you received labwork today, you will receive an invoice from [facility] [facility]. Please contact [facility] at [phone] with questions or concerns regarding your invoice.   Our billing staff will not be able to assist you with questions regarding bills from these companies.  You will be contacted with the lab results as soon as they are available. The fastest way to get your results is to activate your My Chart account. Instructions are located on the last page of this paperwork. If you have not heard from [place] regarding the results in 2 weeks, please contact this office.      Shoulder Pain Many things can cause shoulder pain, including:  An injury to the area.  Overuse of the shoulder.  Arthritis. The source of the pain can be:  Inflammation.  An injury to the shoulder joint.  An injury to a tendon, ligament, or bone. Follow these instructions at home: Take these actions to help with your pain:  Squeeze a soft ball or a foam pad as much as possible. This helps to keep the shoulder from swelling. It also helps to strengthen the arm.  Take over-the-counter and prescription medicines only as told by your health care provider.  If directed, apply ice to the area:  Put ice in a plastic bag.  Place a towel between your skin and the bag.  Leave the ice on for 20 minutes, 2-3 times per day. Stop applying ice if it does not help with the pain.  If you were given a shoulder sling or immobilizer:  Wear it as told.  Remove it to shower or bathe.  Move your arm as little as possible, but keep your hand moving to prevent swelling. Contact a health care  provider if:  Your pain gets worse.  Your pain is not relieved with medicines.  New pain develops in your arm, hand, or fingers. Get help right away if:  Your arm, hand, or fingers:  Tingle.  Become numb.  Become swollen.  Become painful.  Turn white or blue. This information is not intended to replace advice given to you by your health care provider. Make sure you discuss any questions you have with your health care provider. Document Released: 01/30/2005 Document Revised: 12/17/2015 Document Reviewed: 08/15/2014 Elsevier Interactive Patient Education  2017 Elsevier Inc.     Vaginal Yeast infection, Adult Vaginal yeast infection is a condition that causes soreness, swelling, and redness (inflammation) of the vagina. It also causes vaginal discharge. This is a common condition. Some women get this infection frequently. What are the causes? This condition is caused by a change in the normal balance of the yeast (candida) and bacteria that live in the vagina. This change causes an overgrowth of yeast, which causes the inflammation. What increases the risk? This condition is more likely to develop in:  Women who take antibiotic medicines.  Women who have diabetes.  Women who take birth control pills.  Women who are pregnant.  Women who douche often.  Women who have a weak defense (immune) system.  Women who have been taking steroid medicines for a long time.  Women who frequently wear tight clothing. What are the signs  or symptoms? Symptoms of this condition include:  White, thick vaginal discharge.  Swelling, itching, redness, and irritation of the vagina. The lips of the vagina (vulva) may be affected as well.  Pain or a burning feeling while urinating.  Pain during sex. How is this diagnosed? This condition is diagnosed with a medical history and physical exam. This will include a pelvic exam. Your health care provider will examine a sample of your vaginal  discharge under a microscope. Your health care provider may send this sample for testing to confirm the diagnosis. How is this treated? This condition is treated with medicine. Medicines may be over-the-counter or prescription. You may be told to use one or more of the following:  Medicine that is taken orally.  Medicine that is applied as a cream.  Medicine that is inserted directly into the vagina (suppository). Follow these instructions at home:  Take or apply over-the-counter and prescription medicines only as told by your health care provider.  Do not have sex until your health care provider has approved. Tell your sex partner that you have a yeast infection. That person should go to his or her health care provider if he or she develops symptoms.  Do not wear tight clothes, such as pantyhose or tight pants.  Avoid using tampons until your health care provider approves.  Eat more yogurt. This may help to keep your yeast infection from returning.  Try taking a sitz bath to help with discomfort. This is a warm water bath that is taken while you are sitting down. The water should only come up to your hips and should cover your buttocks. Do this 3-4 times per day or as told by your health care provider.  Do not douche.  Wear breathable, cotton underwear.  If you have diabetes, keep your blood sugar levels under control. Contact a health care provider if:  You have a fever.  Your symptoms go away and then return.  Your symptoms do not get better with treatment.  Your symptoms get worse.  You have new symptoms.  You develop blisters in or around your vagina.  You have blood coming from your vagina and it is not your menstrual period.  You develop pain in your abdomen. This information is not intended to replace advice given to you by your health care provider. Make sure you discuss any questions you have with your health care provider. Document Released: 01/30/2005  Document Revised: 10/04/2015 Document Reviewed: 10/24/2014 Elsevier Interactive Patient Education  2017 [employer]

## 2016-07-09 NOTE — Progress Notes (Signed)
Patient ID: LEEANN BADY, female    DOB: 1990-08-17, 26 y.o.   MRN: [id number]  PCP: No PCP Per Patient  Chief Complaint  Patient presents with  . Shoulder Pain    LEFT  . Vaginitis    Subjective:  HPI 26 year old female presents for evaluation of left shoulder pain and vaginitis. Since 15th of February, lifted a couch and shoulder.  Shrugging shoulder down is painful, aching with moving back and forth. She feels her left shoulder is making a "clicking" sound.  Denies frozen shoulder. Pain is most prominent at the top of her shoulder.  Vaginal Itching Reports that she is a virgin and has never had vaginal  Intercourse of any form sexual activity. Feels that she has a yeast infection as symptoms are similar to a prior yeast infection. Reports itching accompanied by a white and clumpy discharge. Urine contact with vaginal tissue causes burning.  Social History   Social History  . Marital status: Single    Spouse name: N/A  . Number of children: N/A  . Years of education: N/A   Occupational History  . Not on file.   Social History Main Topics  . Smoking status: Never Smoker  . Smokeless tobacco: Never Used  . Alcohol use No  . Drug use: No  . Sexual activity: No   Other Topics Concern  . Not on file   Social History Narrative  . No narrative on file    Family History  Problem Relation Age of Onset  . Hypertension Mother   . Thyroid disease Mother   . Hypertension Sister   . Heart disease Sister    Review of Systems See HPI  Allergies  Allergen Reactions  . Peanuts [Peanut Oil] Anaphylaxis    Also: tree nuts   . Other Rash    mushrooms  . Banana     Unknown reaction     Prior to Admission medications   Medication Sig Start Date End Date Taking? Authorizing Provider  Adapalene-Benzoyl Peroxide 0.1-2.5 % gel Apply 1 application topically at bedtime. Reported on 05/10/2015   Yes Historical Provider, MD  Albuterol Sulfate (PROAIR RESPICLICK) 108 (90  BASE) MCG/ACT AEPB Inhale 2 puffs into the lungs every 4 (four) hours as needed. 04/28/15  Yes Sherren Mocha, MD  Drospirenone-Ethinyl Estradiol-Levomefol (BEYAZ) 3-0.02-0.451 MG tablet Take 1 tablet by mouth daily.   Yes Historical Provider, MD  EPINEPHrine (EPIPEN) 0.3 mg/0.3 mL IJ SOAJ injection Inject 0.3 mLs (0.3 mg total) into the muscle as needed. Brand only 05/31/16  Yes Doyle Askew, FNP  escitalopram (LEXAPRO) 10 MG tablet Take 1 tablet (10 mg total) by mouth at bedtime. 06/11/16  Yes Doyle Askew, FNP  escitalopram (LEXAPRO) 20 MG tablet Take 1 tablet (20 mg total) by mouth at bedtime. 06/11/16  Yes Doyle Askew, FNP  hydroquinone 4 % cream Apply topically at bedtime as needed. 05/29/16  Yes Doyle Askew, FNP  ibuprofen (ADVIL,MOTRIN) 200 MG tablet Take 400 mg by mouth every 4 (four) hours as needed for moderate pain. Reported on 05/10/2015   Yes Historical Provider, MD  ipratropium (ATROVENT) 0.03 % nasal spray PLACE 2 SPRAYS INTO BOTH NOSTRILS 2 (TWO) TIMES DAILY. 07/08/16  Yes Morrell Riddle, PA-C  ondansetron (ZOFRAN-ODT) 8 MG disintegrating tablet Take 1 tablet (8 mg total) by mouth every 8 (eight) hours as needed for nausea. 05/29/16  Yes Doyle Askew, FNP  tretinoin (RETIN-A) 0.025 % cream APPLY TOPICALLY  AT BEDTIME. 10/19/15  Yes Emi Belfasteborah B Gessner, FNP  Vitamin D, Ergocalciferol, (DRISDOL) 50000 UNITS CAPS capsule Take 50,000 Units by mouth 2 (two) times a week. Reported on 04/28/2015   Yes Historical Provider, MD    Past Medical, Surgical Family and Social History reviewed and updated.    Objective:   Today's Vitals   07/09/16 1354  BP: 122/70  Pulse: 84  Resp: 18  Temp: 99.1 F (37.3 C)  TempSrc: Oral  SpO2: 100%  Weight: 277 lb (125.6 kg)  Height: 5\' 7"  (1.702 m)    Wt Readings from Last 3 Encounters:  07/09/16 277 lb (125.6 kg)  06/11/16 276 lb 12.8 oz (125.6 kg)  05/29/16 274 lb 9.6 oz (124.6 kg)   Physical  Exam  Constitutional: She is oriented to person, place, and time. She appears well-developed and well-nourished.  HENT:  Head: Normocephalic and atraumatic.  Right Ear: External ear normal.  Left Ear: External ear normal.  Nose: Nose normal.  Mouth/Throat: Oropharynx is clear and moist.  Neck: Normal range of motion. Neck supple.  Cardiovascular: Normal rate, regular rhythm, normal heart sounds and intact distal pulses.   Pulmonary/Chest: Effort normal and breath sounds normal.  Musculoskeletal: She exhibits tenderness. She exhibits no edema or deformity.       Left shoulder: She exhibits decreased range of motion, tenderness and crepitus. She exhibits no bony tenderness, no swelling and no effusion.  Neurological: She is alert and oriented to person, place, and time.  Skin: Skin is warm and dry.  Psychiatric: She has a normal mood and affect. Her behavior is normal. Judgment and thought content normal.     Assessment & Plan:  1. Acute pain of left shoulder - DG Shoulder  2. Yeast Infection  Plan: For shoulder pain start Meloxicam 7.5-15 mg once daily. Continue icing shoulder in 20 minutes increments for pain relief. For Diflucan 150 mg once daily may repeat in 3 days if yeast vaginal  itching doesn't improve.  Godfrey PickKimberly S. Tiburcio PeaHarris, MSN, FNP-C Primary Care at [facility] [phone]

## 2016-07-23 ENCOUNTER — Other Ambulatory Visit: Payer: Self-pay | Admitting: Family Medicine

## 2016-08-05 ENCOUNTER — Other Ambulatory Visit: Payer: Self-pay | Admitting: Family Medicine

## 2016-08-05 NOTE — Telephone Encounter (Signed)
07/09/16 last refill 

## 2016-08-20 DIAGNOSIS — F40218 Other animal type phobia: Secondary | ICD-10-CM | POA: Diagnosis not present

## 2016-08-29 DIAGNOSIS — F4000 Agoraphobia, unspecified: Secondary | ICD-10-CM | POA: Diagnosis not present

## 2016-09-03 DIAGNOSIS — F4000 Agoraphobia, unspecified: Secondary | ICD-10-CM | POA: Diagnosis not present

## 2016-09-26 DIAGNOSIS — F4000 Agoraphobia, unspecified: Secondary | ICD-10-CM | POA: Diagnosis not present

## 2016-09-30 DIAGNOSIS — F4000 Agoraphobia, unspecified: Secondary | ICD-10-CM | POA: Diagnosis not present

## 2016-10-03 DIAGNOSIS — F4000 Agoraphobia, unspecified: Secondary | ICD-10-CM | POA: Diagnosis not present

## 2016-10-14 DIAGNOSIS — F4000 Agoraphobia, unspecified: Secondary | ICD-10-CM | POA: Diagnosis not present

## 2016-11-18 ENCOUNTER — Telehealth: Payer: Self-pay | Admitting: General Practice

## 2016-11-18 NOTE — Telephone Encounter (Signed)
Pt states pharm. Has attempted to reach out to [facility]. Birth control refill. [phone]

## 2016-11-18 NOTE — Telephone Encounter (Signed)
Pt called to check rx. req.   [phone]

## 2016-11-19 NOTE — Telephone Encounter (Signed)
Advised pt that she needs to re-establish care in order to get rx for birth control.

## 2017-02-07 ENCOUNTER — Encounter: Payer: Self-pay | Admitting: Family Medicine

## 2017-02-07 ENCOUNTER — Ambulatory Visit (INDEPENDENT_AMBULATORY_CARE_PROVIDER_SITE_OTHER): Payer: BLUE CROSS/BLUE SHIELD | Admitting: Family Medicine

## 2017-02-07 VITALS — BP 126/72 | HR 109 | Temp 98.0°F | Resp 16 | Ht 67.72 in | Wt 296.0 lb

## 2017-02-07 DIAGNOSIS — F411 Generalized anxiety disorder: Secondary | ICD-10-CM

## 2017-02-07 DIAGNOSIS — F410 Panic disorder [episodic paroxysmal anxiety] without agoraphobia: Secondary | ICD-10-CM | POA: Diagnosis not present

## 2017-02-07 MED ORDER — VENLAFAXINE HCL ER 37.5 MG PO CP24: ORAL_CAPSULE | ORAL | 0 refills | Status: DC

## 2017-02-07 MED ORDER — HYDROXYZINE PAMOATE 25 MG PO CAPS: ORAL_CAPSULE | ORAL | 0 refills | Status: DC

## 2017-02-07 MED ORDER — DROSPIREN-ETH ESTRAD-LEVOMEFOL 3-0.02-0.451 MG PO TABS: 1.0000 | ORAL_TABLET | Freq: Every day | ORAL | 5 refills | Status: DC

## 2017-02-07 NOTE — Progress Notes (Signed)
10/5/20185:12 PM  Kelli Howard 1990-06-13, 26 y.o. female [id number]  Chief Complaint  Patient presents with  . Anxiety    pt states she has been having some chest pains and more anxiety for the past 6 months     HPI:   Patient is a 26 y.o. female with past medical history significant for depression and anxiety who presents today c/o worsening anxiety and panic attacks. Works from home for [employer], today anxiety was so severe that she had to call in sick.   She reports h/o childhood trauma, evaluated by psych at age 79, started on zoloft , it helped for years but had weight gain. She was able to wean off due to resolution and stability, however, about a year ago she was restarted due to depression and it was not effective anymore. She was then changed to lexapro . She recently weaned off about 6 weeks ago, previous to that she was on it for about 5 months, again with weight gain and this time making anxiety worse.  She has a h/o cutting, none recently. She denies any current SI or any past attempts. She denies any mental health hospitalizations. She worked with several therapist in the past, but never found it very beneficial as she felt that the recommendations were not serious, such as she was advised to get a bird. She is open to trying counseling again.   Depression screen PHQ 2/9 02/07/2017  Decreased Interest 1  Down, Depressed, Hopeless 1  PHQ - 2 Score 2  Altered sleeping 3  Tired, decreased energy 3  Change in appetite 3  Feeling bad or failure about yourself  3  Trouble concentrating 3  Moving slowly or fidgety/restless 0  Suicidal thoughts 0  PHQ-9 Score 17  Difficult doing work/chores Very difficult    Depression screen [facility] 2/9 02/07/2017 02/07/2017 07/09/2016  Decreased Interest 1 0 0  Down, Depressed, Hopeless 1 0 0  PHQ - 2 Score 2 0 0  Altered sleeping 3 - -  Tired, decreased energy 3 - -  Change in appetite 3 - -  Feeling bad or failure about  yourself  3 - -  Trouble concentrating 3 - -  Moving slowly or fidgety/restless 0 - -  Suicidal thoughts 0 - -  PHQ-9 Score 17 - -  Difficult doing work/chores Very difficult - -   GAD 7 : Generalized Anxiety Score 02/07/2017  Nervous, Anxious, on Edge 3  Control/stop worrying 2  Worry too much - different things 2  Trouble relaxing 3  Restless 1  Easily annoyed or irritable 3  Afraid - awful might happen 1  Total GAD 7 Score 15  Anxiety Difficulty Very difficult    Allergies  Allergen Reactions  . Peanuts [Peanut Oil] Anaphylaxis    Also: tree nuts   . Other Rash    mushrooms  . Banana     Unknown reaction     Prior to Admission medications   Medication Sig Start Date End Date Taking? Authorizing Provider  Adapalene-Benzoyl Peroxide 0.1-2.5 % gel Apply 1 application topically at bedtime. Reported on 05/10/2015   Yes [provider]  Albuterol Sulfate (PROAIR RESPICLICK) 108 (90 BASE) MCG/ACT AEPB Inhale 2 puffs into the lungs every 4 (four) hours as needed. 04/28/15  Yes Sherren Mocha, MD  Drospirenone-Ethinyl Estradiol-Levomefol (BEYAZ) 3-0.02-0.451 MG tablet Take 1 tablet by mouth daily. 02/07/17  Yes Myles Lipps, MD  EPINEPHrine (EPIPEN) 0.3 mg/0.3 mL IJ SOAJ injection  Inject 0.3 mLs (0.3 mg total) into the muscle as needed. Brand only 05/31/16  Yes Bing Neighbors, FNP  hydroquinone 4 % cream Apply topically at bedtime as needed. 05/29/16  Yes Bing Neighbors, FNP  ibuprofen (ADVIL,MOTRIN) 200 MG tablet Take 400 mg by mouth every 4 (four) hours as needed for moderate pain. Reported on 05/10/2015   Yes [provider]  ipratropium (ATROVENT) 0.03 % nasal spray PLACE 2 SPRAYS INTO BOTH NOSTRILS 2 (TWO) TIMES DAILY. 07/24/16  Yes Weber, Sarah L, PA-C  ondansetron (ZOFRAN-ODT) 8 MG disintegrating tablet Take 1 tablet (8 mg total) by mouth every 8 (eight) hours as needed for nausea. 05/29/16  Yes Bing Neighbors, FNP  tretinoin (RETIN-A) 0.025 % cream APPLY  TOPICALLY AT BEDTIME. 10/19/15  Yes Emi Belfast, FNP  Vitamin D, Ergocalciferol, (DRISDOL) 50000 UNITS CAPS capsule Take 50,000 Units by mouth 2 (two) times a week. Reported on 04/28/2015   Yes [provider]    Past Medical History:  Diagnosis Date  . Allergy   . Anemia   . Enlarged heart   . Heart murmur   . Vitamin D deficiency     Past Surgical History:  Procedure Laterality Date  . DENTAL SURGERY    . TONSILLECTOMY      Social History  Substance Use Topics  . Smoking status: Never Smoker  . Smokeless tobacco: Never Used  . Alcohol use No    Family History  Problem Relation Age of Onset  . Hypertension Mother   . Thyroid disease Mother   . Hypertension Sister   . Heart disease Sister     Review of Systems  Respiratory: Positive for shortness of breath.   Cardiovascular: Positive for palpitations.  Psychiatric/Behavioral: Negative for depression, substance abuse and suicidal ideas. The patient is nervous/anxious.      OBJECTIVE:  Blood pressure 126/72, pulse (!) 109, temperature 98 F (36.7 C), temperature source Oral, resp. rate 16, height 5' 7.72" (1.72 m), weight 296 lb (134.3 kg), SpO2 95 %.  Physical Exam  Constitutional: She is oriented to person, place, and time and well-developed, well-nourished, and in no distress.  HENT:  Head: Normocephalic and atraumatic.  Mouth/Throat: Mucous membranes are normal.  Eyes: Pupils are equal, round, and reactive to light. EOM are normal. No scleral icterus.  Neck: Neck supple.  Pulmonary/Chest: Effort normal.  Neurological: She is alert and oriented to person, place, and time. Gait normal.  Skin: Skin is warm and dry.  Psychiatric: Affect normal. Her mood appears anxious.  Nursing note and vitals reviewed.   ASSESSMENT and PLAN  1. Generalized anxiety disorder with panic attacks Over 50% of this 25 minute visit was spent in counseling and coordination of care. New meds r/se/b discussed. RTC  precautions given. Information for counseling options given.    - hydrOXYzine (VISTARIL) 25 MG capsule; 1-2 capsules at onset of panic attacks, max 4 capsules a day. - Drospirenone-Ethinyl Estradiol-Levomefol (BEYAZ) 3-0.02-0.451 MG tablet; Take 1 tablet by mouth daily. - venlafaxine XR (EFFEXOR-XR) 37.5 MG 24 hr capsule; Start with 1 capsule PO every morning x 2 weeks, then increase to 2 caps PO every morning  Return in about 4 weeks (around 03/07/2017).    Myles Lipps, MD Primary Care at [facility] [address] [place], [place] [address] Ph.  [phone] Fax [phone]

## 2017-02-07 NOTE — Patient Instructions (Addendum)
For therapy -- 1. [facility] (Beth Kincaid, Ernest McCoy,Heather Kitchens, Karla Townsend) - [phone]  2. [facility] (Julie Whitt) - [phone] 3. [facility] - [phone] 4. [facility] - [phone] (do not file insurance) 5. [facility] - accepts most major insurances. [phone] or email [email or web address]    IF you received an x-ray today, you will receive an invoice from [facility]. Please contact [facility] at [phone] with questions or concerns regarding your invoice.   IF you received labwork today, you will receive an invoice from [facility]. Please contact [facility] at [phone] with questions or concerns regarding your invoice.   Our billing staff will not be able to assist you with questions regarding bills from these companies.  You will be contacted with the lab results as soon as they are available. The fastest way to get your results is to activate your My Chart account. Instructions are located on the last page of this paperwork. If you have not heard from us regarding the results in 2 weeks, please contact this office.     

## 2017-02-18 ENCOUNTER — Ambulatory Visit (INDEPENDENT_AMBULATORY_CARE_PROVIDER_SITE_OTHER): Payer: BLUE CROSS/BLUE SHIELD | Admitting: Family Medicine

## 2017-02-18 ENCOUNTER — Encounter: Payer: Self-pay | Admitting: Family Medicine

## 2017-02-18 VITALS — BP 124/76 | HR 98 | Temp 98.7°F | Resp 18 | Ht 67.48 in | Wt 304.6 lb

## 2017-02-18 DIAGNOSIS — F411 Generalized anxiety disorder: Secondary | ICD-10-CM | POA: Diagnosis not present

## 2017-02-18 DIAGNOSIS — F410 Panic disorder [episodic paroxysmal anxiety] without agoraphobia: Secondary | ICD-10-CM

## 2017-02-18 MED ORDER — ONDANSETRON 8 MG PO TBDP: 8.0000 mg | ORAL_TABLET | Freq: Three times a day (TID) | ORAL | 0 refills | Status: DC | PRN

## 2017-02-18 MED ORDER — VENLAFAXINE HCL ER 75 MG PO CP24: ORAL_CAPSULE | ORAL | 2 refills | Status: DC

## 2017-02-18 MED ORDER — LORAZEPAM 0.5 MG PO TABS: 0.5000 mg | ORAL_TABLET | Freq: Two times a day (BID) | ORAL | 0 refills | Status: DC | PRN

## 2017-02-18 NOTE — Progress Notes (Signed)
10/16/20188:50 AM  Kelli Howard Dec 15, 1990, 26 y.o. female [id number]  Chief Complaint  Patient presents with  . Anxiety    HPI:   Patient is a 26 y.o. female who presents today for 2 week followup on anxiety. Tolerating low dose effexor well. However anxiety symptoms are not improved. Hydroxyzine not helping at all, just makes her sleepy but it does not calm her down. She is walking her dog every day, she has not called counseling. She is starting to have issues at work due to calling out sick, ending her shift early.   Depression screen [facility] 2/9 02/18/2017 02/18/2017 02/07/2017  Decreased Interest Down, Depressed, Hopeless PHQ - 2 Score Altered sleeping Tired, decreased energy Change in appetite Feeling bad or failure about yourself  0 0 3  Trouble concentrating Moving slowly or fidgety/restless 0 0 0  Suicidal thoughts 0 0 0  PHQ-9 Score Difficult doing work/chores Extremely dIfficult Very difficult Very difficult   GAD 7 : Generalized Anxiety Score 02/18/2017 02/07/2017  Nervous, Anxious, on Edge 3 3  Control/stop worrying 3 2  Worry too much - different things 3 2  Trouble relaxing 3 3  Restless 1 1  Easily annoyed or irritable 2 3  Afraid - awful might happen 3 1  Total GAD 7 Score 18 15  Anxiety Difficulty Extremely difficult Very difficult      Allergies  Allergen Reactions  . Peanuts [Peanut Oil] Anaphylaxis    Also: tree nuts   . Other Rash    mushrooms  . Banana     Unknown reaction     Prior to Admission medications   Medication Sig Start Date End Date Taking? Authorizing Provider  Adapalene-Benzoyl Peroxide 0.1-2.5 % gel Apply 1 application topically at bedtime. Reported on 05/10/2015   Yes [provider]  Albuterol Sulfate (PROAIR RESPICLICK) 108 (90 BASE) MCG/ACT AEPB Inhale 2 puffs into the lungs every 4 (four) hours as needed. 04/28/15  Yes Sherren Mocha, MD    Drospirenone-Ethinyl Estradiol-Levomefol (BEYAZ) 3-0.02-0.451 MG tablet Take 1 tablet by mouth daily. 02/07/17  Yes Myles Lipps, MD  EPINEPHrine (EPIPEN) 0.3 mg/0.3 mL IJ SOAJ injection Inject 0.3 mLs (0.3 mg total) into the muscle as needed. Brand only 05/31/16  Yes Bing Neighbors, FNP  hydroquinone 4 % cream Apply topically at bedtime as needed. 05/29/16  Yes Bing Neighbors, FNP  ibuprofen (ADVIL,MOTRIN) 200 MG tablet Take 400 mg by mouth every 4 (four) hours as needed for moderate pain. Reported on 05/10/2015   Yes [provider]  tretinoin (RETIN-A) 0.025 % cream APPLY TOPICALLY AT BEDTIME. 10/19/15  Yes Emi Belfast, FNP  venlafaxine XR (EFFEXOR-XR) 37.5 MG 24 hr capsule Start with 1 capsule PO every morning x 2 weeks, then increase to 2 caps PO every morning 02/18/17  Yes Myles Lipps, MD  ipratropium (ATROVENT) 0.03 % nasal spray PLACE 2 SPRAYS INTO BOTH NOSTRILS 2 (TWO) TIMES DAILY. Patient not taking: Reported on 02/18/2017 07/24/16   Morrell Riddle, PA-C  ondansetron (ZOFRAN-ODT) 8 MG disintegrating tablet Take 1 tablet (8 mg total) by mouth every 8 (eight) hours as needed for nausea. 02/18/17   Myles Lipps, MD    Past Medical History:  Diagnosis Date  . Allergy   . Anemia   .  Enlarged heart   . Heart murmur   . Vitamin D deficiency     Past Surgical History:  Procedure Laterality Date  . DENTAL SURGERY    . TONSILLECTOMY      Social History  Substance Use Topics  . Smoking status: Never Smoker  . Smokeless tobacco: Never Used  . Alcohol use No    Family History  Problem Relation Age of Onset  . Hypertension Mother   . Thyroid disease Mother   . Hypertension Sister   . Heart disease Sister     ROS Per HPI  OBJECTIVE:  Blood pressure 124/76, pulse 98, temperature 98.7 F (37.1 C), temperature source Oral, resp. rate 18, height 5' 7.48" (1.714 m), weight (!) 304 lb 9.6 oz (138.2 kg), SpO2 98 %.  Physical Exam  Constitutional:  She is oriented to person, place, and time and well-developed, well-nourished, and in no distress.  HENT:  Head: Normocephalic and atraumatic.  Mouth/Throat: Mucous membranes are normal.  Eyes: Pupils are equal, round, and reactive to light. EOM are normal. No scleral icterus.  Neck: Neck supple.  Pulmonary/Chest: Effort normal.  Neurological: She is alert and oriented to person, place, and time. Gait normal.  Skin: Skin is warm and dry.  Psychiatric: Mood and affect normal.  Nursing note and vitals reviewed.    ASSESSMENT and PLAN  1. Generalized anxiety disorder with panic attacks Continue to titrate effexor. Small rx for ativan - r/se/b discussed. Strongly encouraged counseling. Discussed mindfullness based activities such as body scans, yoga, etc.  - venlafaxine XR (EFFEXOR-XR) 75 MG 24 hr capsule; Start with 1 capsule PO every morning x 2 weeks, then increase to 2 caps PO every morning - LORazepam (ATIVAN) 0.5 MG tablet; Take 1 tablet (0.5 mg total) by mouth 2 (two) times daily as needed for anxiety. - ondansetron (ZOFRAN-ODT) 8 MG disintegrating tablet; Take 1 tablet (8 mg total) by mouth every 8 (eight) hours as needed for nausea.  Return in about 1 week (around 02/25/2017).    Myles Lipps, MD Primary Care at [facility] [address] [place], [place] [address] Ph.  [phone] Fax [phone]

## 2017-02-18 NOTE — Patient Instructions (Addendum)
1. Increase effexor to  once a day, after 2 weeks increase to  once a day if mood not significantly better 2. Call to make a counseling appointment 3. Continue to exercise daily 4. Try body scan for relaxation

## 2017-02-25 ENCOUNTER — Encounter: Payer: Self-pay | Admitting: Family Medicine

## 2017-02-25 ENCOUNTER — Ambulatory Visit (INDEPENDENT_AMBULATORY_CARE_PROVIDER_SITE_OTHER): Payer: BLUE CROSS/BLUE SHIELD | Admitting: Family Medicine

## 2017-02-25 VITALS — BP 120/62 | HR 98 | Temp 98.4°F | Resp 16 | Ht 67.48 in | Wt 300.4 lb

## 2017-02-25 DIAGNOSIS — R599 Enlarged lymph nodes, unspecified: Secondary | ICD-10-CM

## 2017-02-25 DIAGNOSIS — F410 Panic disorder [episodic paroxysmal anxiety] without agoraphobia: Secondary | ICD-10-CM

## 2017-02-25 DIAGNOSIS — F411 Generalized anxiety disorder: Secondary | ICD-10-CM | POA: Diagnosis not present

## 2017-02-25 MED ORDER — VENLAFAXINE HCL ER 150 MG PO CP24: 150.0000 mg | ORAL_CAPSULE | Freq: Every day | ORAL | 3 refills | Status: DC

## 2017-02-25 MED ORDER — CLONIDINE HCL 0.1 MG PO TABS: 0.1000 mg | ORAL_TABLET | Freq: Every day | ORAL | 3 refills | Status: DC

## 2017-02-25 NOTE — Patient Instructions (Signed)
     IF you received an x-ray today, you will receive an invoice from [facility]. Please contact [facility] at [phone] with questions or concerns regarding your invoice.   IF you received labwork today, you will receive an invoice from [facility]. Please contact [facility] at [phone] with questions or concerns regarding your invoice.   Our billing staff will not be able to assist you with questions regarding bills from these companies.  You will be contacted with the lab results as soon as they are available. The fastest way to get your results is to activate your My Chart account. Instructions are located on the last page of this paperwork. If you have not heard from us regarding the results in 2 weeks, please contact this office.     

## 2017-02-25 NOTE — Progress Notes (Signed)
10/23/20188:37 AM  MARGARUITE TOP [birth year], 26 y.o. female [id number]  Chief Complaint  Patient presents with  . Follow-up    anxiety about the some     HPI:   Patient is a 26 y.o. female who presents today for routine fu.  Anxiety not better, tolerating effexor 75mg . No side effects, did not make anxiety worse.  Reports 0.5mg  of lorazepam did not help her during a panic attack. Scheduled appt for outpatient counseling, appt given for February. She is on a cancellation list. Trying other agencies.  Tried body scan meditation, unable to complete/focus.  Confirms longstanding issues with anxiety.   Would like to remind me of non tender bump behind right ear, has been there for over a year, originally evaluated during first visit.   Depression screen [facility] 2/9 02/25/2017 02/18/2017 02/18/2017  Decreased Interest 0 2 2  Down, Depressed, Hopeless 0 1 1  PHQ - 2 Score 0 3 3  Altered sleeping 0 3 2  Tired, decreased energy 0 3 3  Change in appetite 0 3 3  Feeling bad or failure about yourself  0 0 0  Trouble concentrating 0 3 2  Moving slowly or fidgety/restless 0 0 0  Suicidal thoughts 0 0 0  PHQ-9 Score 0 15 13  Difficult doing work/chores - Extremely dIfficult Very difficult    Allergies  Allergen Reactions  . Peanuts [Peanut Oil] Anaphylaxis    Also: tree nuts   . Other Rash    mushrooms  . Banana     Unknown reaction     Prior to Admission medications   Medication Sig Start Date End Date Taking? Authorizing Provider  Adapalene-Benzoyl Peroxide 0.1-2.5 % gel Apply 1 application topically at bedtime. Reported on 05/10/2015   Yes [provider]  Albuterol Sulfate (PROAIR RESPICLICK) 108 (90 BASE) MCG/ACT AEPB Inhale 2 puffs into the lungs every 4 (four) hours as needed. 04/28/15  Yes Sherren Mocha, MD  Drospirenone-Ethinyl Estradiol-Levomefol (BEYAZ) 3-0.02-0.451 MG tablet Take 1 tablet by mouth daily. 02/07/17  Yes Myles Lipps, MD  EPINEPHrine  (EPIPEN) 0.3 mg/0.3 mL IJ SOAJ injection Inject 0.3 mLs (0.3 mg total) into the muscle as needed. Brand only 05/31/16  Yes Bing Neighbors, FNP  hydroquinone 4 % cream Apply topically at bedtime as needed. 05/29/16  Yes Bing Neighbors, FNP  ibuprofen (ADVIL,MOTRIN) 200 MG tablet Take 400 mg by mouth every 4 (four) hours as needed for moderate pain. Reported on 05/10/2015   Yes [provider]  LORazepam (ATIVAN) 0.5 MG tablet Take 1 tablet (0.5 mg total) by mouth 2 (two) times daily as needed for anxiety. 02/18/17  Yes Myles Lipps, MD  ondansetron (ZOFRAN-ODT) 8 MG disintegrating tablet Take 1 tablet (8 mg total) by mouth every 8 (eight) hours as needed for nausea. 02/18/17  Yes Myles Lipps, MD  tretinoin (RETIN-A) 0.025 % cream APPLY TOPICALLY AT BEDTIME. 10/19/15  Yes Emi Belfast, FNP  venlafaxine XR (EFFEXOR-XR) 75 MG 24 hr capsule Start with 1 capsule PO every morning x 2 weeks, then increase to 2 caps PO every morning 02/18/17  Yes Myles Lipps, MD    Past Medical History:  Diagnosis Date  . Allergy   . Anemia   . Enlarged heart   . Heart murmur   . Vitamin D deficiency     Past Surgical History:  Procedure Laterality Date  . DENTAL SURGERY    . TONSILLECTOMY  Social History  Substance Use Topics  . Smoking status: Never Smoker  . Smokeless tobacco: Never Used  . Alcohol use No    Family History  Problem Relation Age of Onset  . Hypertension Mother   . Thyroid disease Mother   . Hypertension Sister   . Heart disease Sister     Review of Systems  Constitutional: Negative for chills, diaphoresis, fever, malaise/fatigue and weight loss.  HENT: Negative for ear discharge, ear pain, hearing loss and tinnitus.   Neurological: Positive for headaches.  Psychiatric/Behavioral: Negative for depression and suicidal ideas. The patient is nervous/anxious.      OBJECTIVE:  Blood pressure 120/62, pulse 98, temperature 98.4 F (36.9 C), resp.  rate 16, height 5' 7.48" (1.714 m), weight (!) 300 lb 6.4 oz (136.3 kg), SpO2 98 %.  Physical Exam  Constitutional: She is oriented to person, place, and time and well-developed, well-nourished, and in no distress.  HENT:  Head: Normocephalic and atraumatic.  Mouth/Throat: Mucous membranes are normal.  Eyes: Pupils are equal, round, and reactive to light. EOM are normal. No scleral icterus.  Neck: Neck supple. No thyromegaly present.  Right post auricular mobile, soft LN > 1cm.  Pulmonary/Chest: Effort normal.  Neurological: She is alert and oriented to person, place, and time. Gait normal.  Skin: Skin is warm and dry.  Psychiatric: Mood and affect normal.  Nursing note and vitals reviewed.    ASSESSMENT and PLAN  1. Generalized anxiety disorder with panic attacks Discussed importance if daily practice of body scan and similar. Discussed trial of 1mg  of lorazepam for next severe panic attacks, r/se/b discussed. Adding clonidine at bedtime, r/se/b discussed. Cont to titrate effexor.  - cloNIDine (CATAPRES) 0.1 MG tablet; Take 1 tablet (0.1 mg total) by mouth at bedtime. - venlafaxine XR (EFFEXOR-XR) 150 MG 24 hr capsule; Take 1 capsule (150 mg total) by mouth daily with breakfast. - CBC with Differential - Comprehensive metabolic panel - TSH  2. Enlarged lymph node - US Soft Tissue Head/Neck; Future - CBC with Differential - Comprehensive metabolic panel  Return for as scheduled.    Myles LippsIrma M Santiago, MD Primary Care at [facility] [place] [address] [place], [place] [address] Ph.  [phone] Fax [phone]

## 2017-02-26 LAB — CBC WITH DIFFERENTIAL/PLATELET
Basophils Absolute: 0 10*3/uL (ref 0.0–0.2)
Basos: 0 %
EOS (ABSOLUTE): 0.2 10*3/uL (ref 0.0–0.4)
Eos: 2 %
Hematocrit: 39.9 % (ref 34.0–46.6)
Hemoglobin: 12.8 g/dL (ref 11.1–15.9)
Immature Grans (Abs): 0 10*3/uL (ref 0.0–0.1)
Immature Granulocytes: 0 %
Lymphocytes Absolute: 2.6 10*3/uL (ref 0.7–3.1)
Lymphs: 34 %
MCH: 27.5 pg (ref 26.6–33.0)
MCHC: 32.1 g/dL (ref 31.5–35.7)
MCV: 86 fL (ref 79–97)
Monocytes Absolute: 0.4 10*3/uL (ref 0.1–0.9)
Monocytes: 5 %
Neutrophils Absolute: 4.5 10*3/uL (ref 1.4–7.0)
Neutrophils: 59 %
Platelets: 311 10*3/uL (ref 150–379)
RBC: 4.65 x10E6/uL (ref 3.77–5.28)
RDW: 13.8 % (ref 12.3–15.4)
WBC: 7.6 10*3/uL (ref 3.4–10.8)

## 2017-02-26 LAB — COMPREHENSIVE METABOLIC PANEL
ALT: 16 IU/L (ref 0–32)
AST: 17 IU/L (ref 0–40)
Albumin/Globulin Ratio: 1.4 (ref 1.2–2.2)
Albumin: 4.2 g/dL (ref 3.5–5.5)
Alkaline Phosphatase: 76 IU/L (ref 39–117)
BUN/Creatinine Ratio: 15 (ref 9–23)
BUN: 10 mg/dL (ref 6–20)
Bilirubin Total: <0.2 mg/dL (ref 0.0–1.2)
CO2: 22 mmol/L (ref 20–29)
Calcium: 9.6 mg/dL (ref 8.7–10.2)
Chloride: 103 mmol/L (ref 96–106)
Creatinine, Ser: 0.66 mg/dL (ref 0.57–1.00)
GFR calc Af Amer: 143 mL/min/{1.73_m2} (ref >59)
GFR calc non Af Amer: 124 mL/min/{1.73_m2} (ref >59)
Globulin, Total: 2.9 g/dL (ref 1.5–4.5)
Glucose: 87 mg/dL (ref 65–99)
Potassium: 4.3 mmol/L (ref 3.5–5.2)
Sodium: 143 mmol/L (ref 134–144)
Total Protein: 7.1 g/dL (ref 6.0–8.5)

## 2017-02-26 LAB — TSH: TSH: 1.29 u[IU]/mL (ref 0.450–4.500)

## 2017-02-27 ENCOUNTER — Encounter: Payer: Self-pay | Admitting: Family Medicine

## 2017-03-04 DIAGNOSIS — F411 Generalized anxiety disorder: Secondary | ICD-10-CM | POA: Diagnosis not present

## 2017-03-11 ENCOUNTER — Ambulatory Visit: Payer: BLUE CROSS/BLUE SHIELD | Admitting: Family Medicine

## 2017-03-11 DIAGNOSIS — F411 Generalized anxiety disorder: Secondary | ICD-10-CM | POA: Diagnosis not present

## 2017-03-12 DIAGNOSIS — F6381 Intermittent explosive disorder: Secondary | ICD-10-CM | POA: Diagnosis not present

## 2017-03-14 DIAGNOSIS — F411 Generalized anxiety disorder: Secondary | ICD-10-CM | POA: Diagnosis not present

## 2017-03-19 DIAGNOSIS — F6381 Intermittent explosive disorder: Secondary | ICD-10-CM | POA: Diagnosis not present

## 2017-03-25 ENCOUNTER — Other Ambulatory Visit: Payer: Self-pay

## 2017-03-26 DIAGNOSIS — F6381 Intermittent explosive disorder: Secondary | ICD-10-CM | POA: Diagnosis not present

## 2017-04-01 DIAGNOSIS — F411 Generalized anxiety disorder: Secondary | ICD-10-CM | POA: Diagnosis not present

## 2017-04-08 DIAGNOSIS — F411 Generalized anxiety disorder: Secondary | ICD-10-CM | POA: Diagnosis not present

## 2017-04-11 DIAGNOSIS — F411 Generalized anxiety disorder: Secondary | ICD-10-CM | POA: Diagnosis not present

## 2017-04-15 DIAGNOSIS — F411 Generalized anxiety disorder: Secondary | ICD-10-CM | POA: Diagnosis not present

## 2017-04-22 DIAGNOSIS — F411 Generalized anxiety disorder: Secondary | ICD-10-CM | POA: Diagnosis not present

## 2017-05-02 DIAGNOSIS — F411 Generalized anxiety disorder: Secondary | ICD-10-CM | POA: Diagnosis not present

## 2017-06-19 DIAGNOSIS — F329 Major depressive disorder, single episode, unspecified: Secondary | ICD-10-CM | POA: Diagnosis not present

## 2017-06-19 DIAGNOSIS — F411 Generalized anxiety disorder: Secondary | ICD-10-CM | POA: Diagnosis not present

## 2017-06-19 DIAGNOSIS — F332 Major depressive disorder, recurrent severe without psychotic features: Secondary | ICD-10-CM | POA: Diagnosis not present

## 2017-06-19 DIAGNOSIS — F4000 Agoraphobia, unspecified: Secondary | ICD-10-CM | POA: Diagnosis not present

## 2017-06-23 DIAGNOSIS — F411 Generalized anxiety disorder: Secondary | ICD-10-CM | POA: Diagnosis not present

## 2017-06-23 DIAGNOSIS — F329 Major depressive disorder, single episode, unspecified: Secondary | ICD-10-CM | POA: Diagnosis not present

## 2017-07-11 DIAGNOSIS — Z0000 Encounter for general adult medical examination without abnormal findings: Secondary | ICD-10-CM | POA: Diagnosis not present

## 2017-07-11 DIAGNOSIS — Z136 Encounter for screening for cardiovascular disorders: Secondary | ICD-10-CM | POA: Diagnosis not present

## 2017-07-11 DIAGNOSIS — Z0289 Encounter for other administrative examinations: Secondary | ICD-10-CM | POA: Diagnosis not present

## 2017-07-11 DIAGNOSIS — Z111 Encounter for screening for respiratory tuberculosis: Secondary | ICD-10-CM | POA: Diagnosis not present

## 2017-07-18 DIAGNOSIS — F411 Generalized anxiety disorder: Secondary | ICD-10-CM | POA: Diagnosis not present

## 2017-07-18 DIAGNOSIS — F339 Major depressive disorder, recurrent, unspecified: Secondary | ICD-10-CM | POA: Diagnosis not present

## 2017-07-18 DIAGNOSIS — Z79899 Other long term (current) drug therapy: Secondary | ICD-10-CM | POA: Diagnosis not present

## 2017-08-01 ENCOUNTER — Other Ambulatory Visit (HOSPITAL_COMMUNITY): Payer: BLUE CROSS/BLUE SHIELD | Attending: Psychiatry | Admitting: Licensed Clinical Social Worker

## 2017-08-01 DIAGNOSIS — F332 Major depressive disorder, recurrent severe without psychotic features: Secondary | ICD-10-CM | POA: Diagnosis not present

## 2017-08-01 DIAGNOSIS — F411 Generalized anxiety disorder: Secondary | ICD-10-CM | POA: Insufficient documentation

## 2017-08-01 DIAGNOSIS — F603 Borderline personality disorder: Secondary | ICD-10-CM | POA: Insufficient documentation

## 2017-08-01 DIAGNOSIS — Z79899 Other long term (current) drug therapy: Secondary | ICD-10-CM | POA: Diagnosis not present

## 2017-08-04 ENCOUNTER — Other Ambulatory Visit (HOSPITAL_COMMUNITY): Payer: BLUE CROSS/BLUE SHIELD | Attending: Psychiatry | Admitting: Licensed Clinical Social Worker

## 2017-08-04 ENCOUNTER — Encounter (HOSPITAL_COMMUNITY): Payer: Self-pay | Admitting: Professional

## 2017-08-04 VITALS — BP 108/72 | HR 94 | Ht 67.0 in | Wt 303.0 lb

## 2017-08-04 DIAGNOSIS — F411 Generalized anxiety disorder: Secondary | ICD-10-CM | POA: Diagnosis not present

## 2017-08-04 DIAGNOSIS — F909 Attention-deficit hyperactivity disorder, unspecified type: Secondary | ICD-10-CM | POA: Insufficient documentation

## 2017-08-04 DIAGNOSIS — F603 Borderline personality disorder: Secondary | ICD-10-CM

## 2017-08-04 DIAGNOSIS — F332 Major depressive disorder, recurrent severe without psychotic features: Secondary | ICD-10-CM | POA: Insufficient documentation
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
## 2017-08-04 NOTE — Psych (Cosign Needed)
Behavioral Health Partial Program Assessment Note  Date: 08/04/2017 Name: Kelli Howard MRN: [id number]  Chief Complaint: Depression   Patient  Kelli DartingJasmine Howard 27 year old African-American female.  Presents after [facility].  Patient presents with concerns of depression, anxiety and suicidal ideations.  Reports overdose due to unidentified stressors.  Patient is flat guarded and limited with responses.  Patient was evaluated by NP and LCSW.  States  "I dont like opening up to people I do not know."  Patient is very abrupt and short with responses.  Per [facility] from [facility] patient's diagnosis with MDD GAD borderline.  Patient was prescribed gabapentin 300 twice daily, Effexor 225 mg daily, Seroquel 100 mg at bedtime, Remeron 15 mg by mouth at bedtime and Trazodone 50-200 mg at bedtime  HPI: Patient is a 27 y.o. African American female presents with depression and anxiety.  Patient was enrolled in partial psychiatric program on 08/04/17.  Primary complaints include: agitation and feeling depressed.  Onset of symptoms was gradual with gradually worsening course since that time. Psychosocial Stressors include the following: family.    I have reviewed the following documentation dated 08/04/2017  Complaints of Pain: nonear Past Psychiatric History: 2 previous  None  Currently in treatment with trazodone and Seroquel mirtazapine and Effexor  Substance Abuse History: marijuana Use of Alcohol: denied   Past Surgical History:  Procedure Laterality Date  . DENTAL SURGERY    . TONSILLECTOMY      Past Medical History:  Diagnosis Date  . Allergy   . Anemia   . Enlarged heart   . Heart murmur   . Vitamin D deficiency    Outpatient Encounter Medications as of 08/04/2017  Medication Sig Note  . Adapalene-Benzoyl Peroxide 0.1-2.5 % gel Apply 1 application topically at bedtime. Reported on 05/10/2015   . Albuterol Sulfate (PROAIR  RESPICLICK) 108 (90 BASE) MCG/ACT AEPB Inhale 2 puffs into the lungs every 4 (four) hours as needed.   . Drospirenone-Ethinyl Estradiol-Levomefol (BEYAZ) 3-0.02-0.451 MG tablet Take 1 tablet by mouth daily.   Marland Kitchen. EPINEPHrine (EPIPEN) 0.3 mg/0.3 mL IJ SOAJ injection Inject 0.3 mLs (0.3 mg total) into the muscle as needed. Brand only   . gabapentin (NEURONTIN) 300 MG capsule Take 300 mg by mouth daily.   . hydrOXYzine (ATARAX/VISTARIL) 50 MG tablet Take 50 mg by mouth every 6 (six) hours as needed.   Marland Kitchen. ibuprofen (ADVIL,MOTRIN) 200 MG tablet Take 400 mg by mouth every 4 (four) hours as needed for moderate pain. Reported on 05/10/2015   . Melatonin 3 MG TABS Take 3 mg by mouth at bedtime.   . mirtazapine (REMERON) 15 MG tablet Take 15 mg by mouth at bedtime.   Marland Kitchen. omeprazole (PRILOSEC) 20 MG capsule Take 20 mg by mouth daily.   . QUEtiapine (SEROQUEL) 25 MG tablet Take 25 mg by mouth every 6 (six) hours as needed.   Marland Kitchen. QUEtiapine (SEROQUEL) 50 MG tablet Take 100 mg by mouth at bedtime.   . traZODone (DESYREL) 50 MG tablet Take 200 mg by mouth at bedtime.   . tretinoin (RETIN-A) 0.025 % cream APPLY TOPICALLY AT BEDTIME.   Marland Kitchen. venlafaxine XR (EFFEXOR-XR) 150 MG 24 hr capsule Take 1 capsule (150 mg total) by mouth daily with breakfast.   . venlafaxine XR (EFFEXOR-XR) 75 MG 24 hr capsule Take 75 mg by mouth daily.   . cloNIDine (CATAPRES) 0.1 MG tablet Take 1 tablet (0.1 mg total) by mouth at bedtime. (Patient not  taking: Reported on 08/04/2017)   . hydroquinone 4 % cream Apply topically at bedtime as needed. (Patient not taking: Reported on 08/04/2017)   . LORazepam (ATIVAN) 0.5 MG tablet Take 1 tablet (0.5 mg total) by mouth 2 (two) times daily as needed for anxiety. (Patient not taking: Reported on 08/04/2017) 08/04/2017: Out currently   . ondansetron (ZOFRAN-ODT) 8 MG disintegrating tablet Take 1 tablet (8 mg total) by mouth every 8 (eight) hours as needed for nausea. (Patient not taking: Reported on 08/04/2017)    No  facility-administered encounter medications on file as of 08/04/2017.    Allergies  Allergen Reactions  . Peanuts [Peanut Oil] Anaphylaxis    Also: tree nuts   . Other Rash    mushrooms  . Banana     Unknown reaction     Social History   Tobacco Use  . Smoking status: Never Smoker  . Smokeless tobacco: Never Used  Substance Use Topics  . Alcohol use: No   Functioning Relationships: good support system  Family History  Problem Relation Age of Onset  . Hypertension Mother   . Thyroid disease Mother   . Hypertension Sister   . Heart disease Sister   . Anxiety disorder Brother   . Depression Brother   . ADD / ADHD Other      Review of Systems Constitutional: negative  Objective:  Vitals:   08/04/17 1137  BP: 108/72  Pulse: 94  SpO2: 98%      Mental Status Exam: Appearance:  Well groomed Psychomotor::  Within Normal Limits Attention span and concentration: Normal Behavior: calm, cooperative and adequate rapport can be established Speech:  normal pitch Mood:  depressed and anxious Affect:  labile Thought Process:  Coherent Thought Content:  Hallucinations: None Orientation:  person, place and time/date Cognition:  grossly intact Insight:  Intact Judgment:  Intact Estimate of Intelligence: Average Fund of knowledge: Intact Memory: Recent and remote intact Abnormal movements: None Gait and station: Normal  Assessment:  Diagnosis: No primary diagnosis found. No diagnosis found.  Indications for admission: inpatient care required if not in partial hospital program  Plan: patient enrolled in Partial Hospitalization Program and a comprehensive treatment plan will be developed  Treatment options and alternatives reviewed with patient and patient understands the above plan.   Oneta Rack, NP

## 2017-08-04 NOTE — Progress Notes (Signed)
Met with patient who presented with flat affect, depressed mood and reported she started PHP today after transitioning from [facility] for admission there voluntarily for depression.  Patient reported she has struggled with depression "for a long time" but denies any current suicidal or homicidal ideations, no auditory or visual hallucinations and no history of attempts to harm self or others.  Patient reviewed all current medications with this nurse and rated her current level of depression a 4, anxiety a 3 and hopelessness a 0 on a scale of 0-10 with 0 being none and 10 the worst she could manage.  Patient scored a 13 on her PHQ9 depression screening today and reported she had to take a leave of absence from work currently due to ongoing problems managing depressive symptoms.  Patient reported she has a lot of problems with sleep, is taking Seroquel 100 mg at bedtime, along with Remeron 15 mg and Melatonin 3 mg all at bedtime.  States she still has problems going to sleep and up and down a lot and only averages about 4 hours per night currently.  Again, patient denied any current problems with medications but reviewed potential side effects and discussed patient's need to keep this nurse or PHP staff informed if any worsening of symptoms while in the program or side effects to prescribed medications.  Patient reported she would need to see the [facility] nurse practitioner this week as she stated she is getting low on medications and needs refills.  Patient to see NP on tomorrow and denied any other concerns at this time.  Patient reported currently living with her parents, which she describes as a good situation and them as positive supports.  Patient reported some weight gain issues over the past year as has increased more than 26 pounds over the previous year. Discussed goals and objectives of PHP treatment with patient and she returned to group without any other concerns reported or noted at this time.

## 2017-08-05 ENCOUNTER — Telehealth (HOSPITAL_COMMUNITY): Payer: Self-pay | Admitting: Professional

## 2017-08-05 ENCOUNTER — Other Ambulatory Visit (HOSPITAL_COMMUNITY): Payer: BLUE CROSS/BLUE SHIELD | Admitting: Family

## 2017-08-05 ENCOUNTER — Other Ambulatory Visit (HOSPITAL_COMMUNITY): Payer: BLUE CROSS/BLUE SHIELD | Admitting: Licensed Clinical Social Worker

## 2017-08-05 DIAGNOSIS — F332 Major depressive disorder, recurrent severe without psychotic features: Secondary | ICD-10-CM

## 2017-08-06 ENCOUNTER — Encounter (HOSPITAL_COMMUNITY): Payer: Self-pay | Admitting: Family

## 2017-08-06 ENCOUNTER — Other Ambulatory Visit (HOSPITAL_COMMUNITY): Payer: BLUE CROSS/BLUE SHIELD

## 2017-08-06 NOTE — Discharge Summary (Signed)
  [facility] Psych Discharge Summary  Kelli Howard [id number]  Admission date: 08/01/2017 Discharge date: 08/06/2017  Reason for admission: Per CCA assessment note:- [facility]. Pt reports she has been diagnosed with MDD, GAD, and BPD, with hx of ADHD. Pt reports increase in anxiety and depression symptoms in the last few months. Pt shares she dropped out of college due to symptoms. Pt states she has been self-harming (cutting) and having passive SI. Pt reports she also struggles with panic attacks, anhedonia, tiredness, irritability, mood swings, trouble staying asleep, lack of focus and motivation, racing thoughts, and weight gain. Pt shares she [facility] before [facility] due to SI. Pt reports recent hx of breakup of 6 year relationship. Pt reports struggling with staying on meds once prescribed due to stigma, pressure from parents to get off, and not feeling like she needs them once she starts to feel better. Pt states she has not been able to hold down a job for longer than a few months because of symptoms. Pt was recently working at [employer] and quit due to being unable to get to work and handle tasks. Pt states she lives with mom and dad. Patients Currently Reported Symptoms/Problems: passive SI, self-harming (cutting), depression, anxiety, panic attacks, mood swings, irritability, hopelessness, worthlessness, trouble staying alseep, unable to work/attend school, racing thoughts, memory problems, concentration issues, anhedonia.   Progress in Program Toward Treatment Goals: Patient attended and participated with  2 group sessions. Reports " I don't think this group is a good fit for me." Patient reports she will not be returning back to group and is requesting for her medications to be refilled. States she will find her a psychiatrist and will consider driving to  [facility] for [facility] group services.   Progress (rationale): Ongoing   Discharge Plan:  Patient is requesting medication refills- [facility] for a current discharge summary of medications and directions.-  Patient provided verbal permission and was present during the calls.- NP spoke to Pharmacist  Har[facility] Heckandy [facility] and [place] at [facility].-08/05/2017@09 :50.   Education was provided with taken medication as directed  Medications was dispensed  on 07/28/2017 (Patient is not taking as prescribed)    Take all medications as prescribed. Keep all follow-up appointments as scheduled.  Do not consume alcohol or use illegal drugs while on prescription medications. Report any adverse effects from your medications to your primary care provider promptly.  In the event of recurrent symptoms or worsening symptoms, call 911, a crisis hotline, or go to the nearest emergency department for evaluation.   Kelli Rackanika N Viola Kinnick, NP 08/06/2017

## 2017-08-07 ENCOUNTER — Other Ambulatory Visit (HOSPITAL_COMMUNITY): Payer: Self-pay

## 2017-08-07 ENCOUNTER — Ambulatory Visit (HOSPITAL_COMMUNITY): Payer: Self-pay

## 2017-08-07 NOTE — Psych (Signed)
  [facility] Partial Hospitalization Program Psych Discharge Summary  Kelli Howard [id number]  Admission date: 08/04/17 Discharge date: 08/05/17  Reason for admission: Step down from residential treatment  Progress in Program Toward Treatment Goals: Pt made no progress  Progress (rationale): Pt attended one day of group services in which she exhibited therapy interfering behaviors. Pt reported for her second day of treatment and stated she did not feel this group was "right for me" due to lack of "field trips and other stuff" including horticulture, daily yoga, daily art therapy, and daily recreation time. Pt was educated on the difference between residential and outpatient services and encouraged to allow herself to acclimate before making a decision. Pt remained resistant to returning to treatment. Pt met with psychiatrist to discuss discharge. Pt walked out without returning to group to complete discharge protocols. Cln denied safety risks before leaving. Cln will follow up by phone.   Discharge Plan: Pt is recommended to engage in continued therapy and psychiatric services. Cln will attempt to make contact with pt to educate on recommendations.     Lorin Glass, LCSW 08/07/2017

## 2017-08-07 NOTE — Psych (Signed)
[facility] THERAPIST PROGRESS [facility]  Kelli Howard Kelli Howard [id number]  Session Time: 9:00 - 10:30  Participation Level: Active  Behavioral Response: CasualAlertDepressed  Type of Therapy: Group Therapy  Treatment Goals addressed: Coping  Interventions: CBT, DBT, Solution Focused, Supportive and Reframing  Summary: Clinician led check-in regarding current stressors and situation, and review of patient completed daily inventory. Clinician utilized active listening and empathetic response and validated patient emotions. Clinician facilitated processing group on pertinent issues.    Therapist Response: Kelli Howard Bound is a 27 y.o. female who presents with depression, anxiety, and personality traits. Patient arrived within time allowed and reports that she "didn't sleep well." Patient rates her mood at a 5 on a scale of 1-10 with 10 being great. Pt shares she discharged from residential last week and spent the weekend with her family. Pt states she feels she is doing well since discharge form residential.  Patient engaged in discussion.      Session Time: 10:30 -11:30  Participation Level: Active  Behavioral Response: CasualAlertDepressed  Type of Therapy: Group Therapy, psychoeducation, psychotherapy  Treatment Goals addressed: Coping  Interventions: CBT, DBT, Solution Focused, Supportive and Reframing  Summary:  Clinician introduced topic of feelings and emotions. Group viewed feeling faces handout and identified which three emotions they feel right now and which they want to feel. Clinician discussed the way to contextualize feelings as things that come and go and the ability to choose which feelings we attach to. Cln used metaphor of leaves in the wind.    Therapist Response: Patient participated and shared the three feelings she wants to feel more of are: "confident, happy, peaceful." Pt reports understanding of feelings and their purpose.     Session Time: 11:30 -  12:15   Participation Level: Active   Behavioral Response: CasualAlertDepressed   Type of Therapy: Group Therapy, Psychotherapy   Treatment Goals addressed: Coping   Interventions: CBT, Solution focused, Supportive, Reframing   Summary: Cln continued topic of feelings. Cln provided education on the difference between feelings and reactions to feelings, highlighting that our reactions we can alter.    Therapist Response: Patient participated and reports understanding of the role of feelings and the differences between feelings and our reactions to feelings.        Session Time: 12:15 - 1:00  Participation Level: Active  Behavioral Response: CasualAlertDepressed  Type of Therapy: Group Therapy, Activity Therapy  Treatment Goals addressed: Coping  Interventions: Psychologist, occupationalocial Skills Training, Supportive  Summary:  Reflection Group: Patients encouraged to practice skills and interpersonal techniques or work on mindfulness and relaxation techniques. The importance of self-care and making skills part of a routine to increase usage were stressed   Therapist Response: Patient engaged and participated appropriately.       Session Time: 12:45- 2:00  Participation Level: Active  Behavioral Response: CasualAlertDepressed  Type of Therapy: Group Therapy, Psychoeducation; Psychotherapy  Treatment Goals addressed: Coping  Interventions: CBT; Solution focused; Supportive; Reframing  Summary: 12:45 - 1:50: Clinician continued topic of feelings. Cln discussed 3 states of mind: emotion mind, reason mind, and wise mind. Pt's shared examples of when they were in each state of mind. Clinician utilized hand out "ways to actively challenge your own beliefs" and pt's discussed ways to access reason mind through facts/logic.   1:50 -2:00 Clinician led check-out. Clinician assessed for immediate needs, medication compliance and efficacy, and safety concerns   Therapist Response: Patient  engaged in activity and discussion. Pt exhibited therapy interfering behaviors of  making jokes and purposefully misinterpreting handouts to make them problematic. Pt was resistant to redirection.  At check-out, patient rates her mood at a 5 on a scale of 1-10 with 10 being great. Patient reports plans of reading outside this afternoon.  Patient demonstrates limited progress as evidenced by participation in first group session. Patient denies SI/HI/self-harm at the end of group.       Suicidal/Homicidal: Nowithout intent/plan  Plan: Pt will continue in PHP and work towards increasing ability to self manage symptoms and decrease depression and anxiety symptoms.   Diagnosis: Severe episode of recurrent major depressive disorder, without psychotic features (HCC) [F33.2]    1. Severe episode of recurrent major depressive disorder, without psychotic features (HCC)   2. Generalized anxiety disorder   3. Borderline personality disorder (HCC)       Kelli Guiles, LCSW 08/07/2017

## 2017-08-08 ENCOUNTER — Other Ambulatory Visit (HOSPITAL_COMMUNITY): Payer: Self-pay

## 2017-08-11 ENCOUNTER — Other Ambulatory Visit (HOSPITAL_COMMUNITY): Payer: Self-pay

## 2017-08-12 ENCOUNTER — Other Ambulatory Visit (HOSPITAL_COMMUNITY): Payer: Self-pay

## 2017-08-12 ENCOUNTER — Ambulatory Visit (HOSPITAL_COMMUNITY): Payer: Self-pay

## 2017-08-13 ENCOUNTER — Other Ambulatory Visit (HOSPITAL_COMMUNITY): Payer: Self-pay

## 2017-08-14 ENCOUNTER — Ambulatory Visit (HOSPITAL_COMMUNITY): Payer: Self-pay

## 2017-08-14 ENCOUNTER — Other Ambulatory Visit (HOSPITAL_COMMUNITY): Payer: Self-pay

## 2017-08-15 ENCOUNTER — Other Ambulatory Visit (HOSPITAL_COMMUNITY): Payer: Self-pay

## 2017-08-18 ENCOUNTER — Other Ambulatory Visit (HOSPITAL_COMMUNITY): Payer: Self-pay

## 2017-08-19 ENCOUNTER — Ambulatory Visit (HOSPITAL_COMMUNITY): Payer: Self-pay

## 2017-08-19 ENCOUNTER — Other Ambulatory Visit (HOSPITAL_COMMUNITY): Payer: Self-pay

## 2017-08-20 ENCOUNTER — Other Ambulatory Visit (HOSPITAL_COMMUNITY): Payer: Self-pay

## 2017-08-21 ENCOUNTER — Ambulatory Visit (HOSPITAL_COMMUNITY): Payer: Self-pay

## 2017-08-21 ENCOUNTER — Other Ambulatory Visit (HOSPITAL_COMMUNITY): Payer: Self-pay

## 2017-08-22 ENCOUNTER — Other Ambulatory Visit (HOSPITAL_COMMUNITY): Payer: Self-pay

## 2017-08-25 ENCOUNTER — Other Ambulatory Visit (HOSPITAL_COMMUNITY): Payer: Self-pay

## 2017-08-26 ENCOUNTER — Other Ambulatory Visit (HOSPITAL_COMMUNITY): Payer: Self-pay

## 2017-08-26 ENCOUNTER — Ambulatory Visit (HOSPITAL_COMMUNITY): Payer: Self-pay

## 2017-08-27 ENCOUNTER — Other Ambulatory Visit (HOSPITAL_COMMUNITY): Payer: Self-pay

## 2017-08-28 ENCOUNTER — Ambulatory Visit (HOSPITAL_COMMUNITY): Payer: Self-pay

## 2017-08-28 ENCOUNTER — Other Ambulatory Visit (HOSPITAL_COMMUNITY): Payer: Self-pay

## 2017-08-29 ENCOUNTER — Other Ambulatory Visit (HOSPITAL_COMMUNITY): Payer: Self-pay

## 2017-09-02 ENCOUNTER — Other Ambulatory Visit: Payer: Self-pay

## 2017-09-02 ENCOUNTER — Encounter: Payer: Self-pay | Admitting: Family Medicine

## 2017-09-02 ENCOUNTER — Ambulatory Visit: Payer: BLUE CROSS/BLUE SHIELD | Admitting: Family Medicine

## 2017-09-02 VITALS — BP 128/62 | HR 100 | Temp 99.0°F | Ht 67.5 in | Wt 304.6 lb

## 2017-09-02 DIAGNOSIS — F411 Generalized anxiety disorder: Secondary | ICD-10-CM | POA: Diagnosis not present

## 2017-09-02 DIAGNOSIS — F410 Panic disorder [episodic paroxysmal anxiety] without agoraphobia: Secondary | ICD-10-CM | POA: Diagnosis not present

## 2017-09-02 DIAGNOSIS — L700 Acne vulgaris: Secondary | ICD-10-CM | POA: Diagnosis not present

## 2017-09-02 DIAGNOSIS — F332 Major depressive disorder, recurrent severe without psychotic features: Secondary | ICD-10-CM | POA: Diagnosis not present

## 2017-09-02 MED ORDER — GABAPENTIN 300 MG PO CAPS: 300.0000 mg | ORAL_CAPSULE | Freq: Every day | ORAL | 5 refills | Status: DC

## 2017-09-02 MED ORDER — OMEPRAZOLE 20 MG PO CPDR: 20.0000 mg | DELAYED_RELEASE_CAPSULE | Freq: Every day | ORAL | 5 refills | Status: DC

## 2017-09-02 MED ORDER — MIRTAZAPINE 15 MG PO TABS: 15.0000 mg | ORAL_TABLET | Freq: Every day | ORAL | 5 refills | Status: DC

## 2017-09-02 MED ORDER — VENLAFAXINE HCL ER 150 MG PO CP24: 150.0000 mg | ORAL_CAPSULE | Freq: Every day | ORAL | 5 refills | Status: DC

## 2017-09-02 MED ORDER — QUETIAPINE FUMARATE 25 MG PO TABS: 25.0000 mg | ORAL_TABLET | Freq: Four times a day (QID) | ORAL | 0 refills | Status: DC | PRN

## 2017-09-02 MED ORDER — MELATONIN 3 MG PO TABS: 3.0000 mg | ORAL_TABLET | Freq: Every day | ORAL | 5 refills | Status: DC

## 2017-09-02 MED ORDER — CLINDAMYCIN PHOS-BENZOYL PEROX 1-5 % EX GEL: Freq: Two times a day (BID) | CUTANEOUS | 11 refills | Status: DC

## 2017-09-02 MED ORDER — QUETIAPINE FUMARATE 100 MG PO TABS: 100.0000 mg | ORAL_TABLET | Freq: Every day | ORAL | 5 refills | Status: DC

## 2017-09-02 MED ORDER — TRETINOIN 0.025 % EX CREA: TOPICAL_CREAM | CUTANEOUS | 11 refills | Status: DC

## 2017-09-02 MED ORDER — TRAZODONE HCL 100 MG PO TABS: 200.0000 mg | ORAL_TABLET | Freq: Every day | ORAL | 5 refills | Status: DC

## 2017-09-02 NOTE — Patient Instructions (Signed)
     IF you received an x-ray today, you will receive an invoice from [facility]. Please contact [facility] at [phone] with questions or concerns regarding your invoice.   IF you received labwork today, you will receive an invoice from [facility]. Please contact [facility] at [phone] with questions or concerns regarding your invoice.   Our billing staff will not be able to assist you with questions regarding bills from these companies.  You will be contacted with the lab results as soon as they are available. The fastest way to get your results is to activate your My Chart account. Instructions are located on the last page of this paperwork. If you have not heard from us regarding the results in 2 weeks, please contact this office.     

## 2017-09-02 NOTE — Progress Notes (Signed)
4/30/20198:23 AM  Kelli Howard [birth year], 27 y.o. female [id number]  Chief Complaint  Patient presents with  . Follow-up    anxiety and depression follow up    HPI:   Patient is a 27 y.o. female with past medical history significant for anxiety and depression who presents today for followup  Last visit with me in Oct 2018 Since then she tried intensive outpatient therapy via [facility], did not find useful She then admitted herself in at [facility] for a month. Came out about 2 weeks. Feels this was very helpful. Feels very stable on current regime of medications. Will be starting out patient therapy at [facility] with Dr Riesa Pope She is requesting letter for employer. Currently working at [employer], works outside of home, Currently her days off do not match therapists clinic days She is also interested in returning to school Lives at home, mother very supportive Has no acute concerns today Needs refills, not seeing psychiatrist Acne well controlled on current meds  Fall Risk  02/25/2017 02/18/2017 02/07/2017 07/09/2016 06/11/2016  Falls in the past year? No No No No No     Depression screen [facility] 2/9 09/02/2017 08/04/2017 08/04/2017  Decreased Interest Down, Depressed, Hopeless 1 0 2  PHQ - 2 Score Altered sleeping Tired, decreased energy Change in appetite 0 0 1  Feeling bad or failure about yourself  1 0 1  Trouble concentrating Moving slowly or fidgety/restless 0 2 0  Suicidal thoughts 0 0 0  PHQ-9 Score Difficult doing work/chores Very difficult - -  Some recent data might be hidden   GAD 7 : Generalized Anxiety Score 09/02/2017 08/04/2017 02/25/2017 02/18/2017  Nervous, Anxious, on Edge Control/stop worrying Worry too much - different things Trouble relaxing Restless Easily annoyed or irritable Afraid - awful might happen 0 0 2 3  Total  GAD 7 Score Anxiety Difficulty Very difficult Very difficult Very difficult Extremely difficult    Allergies  Allergen Reactions  . Peanuts [Peanut Oil] Anaphylaxis    Also: tree nuts   . Other Rash    mushrooms  . Banana     Unknown reaction     Prior to Admission medications   Medication Sig Start Date End Date Taking? Authorizing Provider  clindamycin-benzoyl peroxide (BENZACLIN) gel Apply topically 2 (two) times daily.   Yes [provider]  Drospirenone-Ethinyl Estradiol-Levomefol (BEYAZ) 3-0.02-0.451 MG tablet Take 1 tablet by mouth daily. 02/07/17  Yes Myles Lipps, MD  EPINEPHrine (EPIPEN) 0.3 mg/0.3 mL IJ SOAJ injection Inject 0.3 mLs (0.3 mg total) into the muscle as needed. Brand only 05/31/16  Yes Bing Neighbors, FNP  gabapentin (NEURONTIN) 300 MG capsule Take 300 mg by mouth daily. 07/15/17  Yes [provider]  Melatonin 3 MG TABS Take 3 mg by mouth at bedtime.   Yes [provider]  mirtazapine (REMERON) 15 MG tablet Take 15 mg by mouth at bedtime. 07/28/17  Yes [provider]  omeprazole (PRILOSEC) 20 MG capsule Take 20 mg by mouth daily. 07/16/17  Yes [provider]  QUEtiapine (SEROQUEL) 25 MG tablet Take 25 mg by mouth every 6 (  six) hours as needed. 06/25/17  Yes [provider]  QUEtiapine (SEROQUEL) 50 MG tablet Take 100 mg by mouth at bedtime. 07/28/17  Yes [provider]  traZODone (DESYREL) 50 MG tablet Take 200 mg by mouth at bedtime. 07/08/17  Yes [provider]  tretinoin (RETIN-A) 0.025 % cream APPLY TOPICALLY AT BEDTIME. 10/19/15  Yes Emi Belfast, FNP  venlafaxine XR (EFFEXOR-XR) 150 MG 24 hr capsule Take 1 capsule (150 mg total) by mouth daily with breakfast. 02/25/17  Yes Myles Lipps, MD    Past Medical History:  Diagnosis Date  . Allergy   . Anemia   . Enlarged heart   . Heart murmur   . Vitamin D deficiency     Past Surgical History:  Procedure  Laterality Date  . DENTAL SURGERY    . TONSILLECTOMY      Social History   Tobacco Use  . Smoking status: Never Smoker  . Smokeless tobacco: Never Used  Substance Use Topics  . Alcohol use: No    Family History  Problem Relation Age of Onset  . Hypertension Mother   . Thyroid disease Mother   . Hypertension Sister   . Heart disease Sister   . Anxiety disorder Brother   . Depression Brother   . ADD / ADHD Other     Review of Systems  Constitutional: Negative for malaise/fatigue and weight loss.  Psychiatric/Behavioral: Positive for depression. Negative for substance abuse and suicidal ideas. The patient is nervous/anxious and has insomnia (well controlled on current meds).     OBJECTIVE:  Blood pressure 128/62, pulse 100, temperature 99 F (37.2 C), temperature source Oral, height 5' 7.5" (1.715 m), weight (!) 304 lb 9.6 oz (138.2 kg), SpO2 97 %.  Physical Exam  Constitutional: She is oriented to person, place, and time.  HENT:  Head: Normocephalic and atraumatic.  Mouth/Throat: Mucous membranes are normal.  Eyes: Pupils are equal, round, and reactive to light. EOM are normal. No scleral icterus.  Neck: Neck supple.  Pulmonary/Chest: Effort normal.  Neurological: She is alert and oriented to person, place, and time.  Skin: Skin is warm and dry.  Nursing note and vitals reviewed.   ASSESSMENT and PLAN  1. Generalized anxiety disorder with panic attacks - venlafaxine XR (EFFEXOR-XR) 150 MG 24 hr capsule; Take 1 capsule (150 mg total) by mouth daily with breakfast 2. Severe episode of recurrent major depressive disorder, without psychotic features (HCC) Patient with significantly improved mood. Continue current regime. Letter provided for employer. Consider psychiatrist. Start outpatient counseling. Discussed weight gain with current medications, advised LFM.  3. Acne vulgaris Clear skin on current regime, cont.  Other orders - clindamycin-benzoyl peroxide  (BENZACLIN) gel; Apply topically 2 (two) times daily. - gabapentin (NEURONTIN) 300 MG capsule; Take 1 capsule (300 mg total) by mouth daily. - Melatonin 3 MG TABS; Take 1 tablet (3 mg total) by mouth at bedtime. - mirtazapine (REMERON) 15 MG tablet; Take 1 tablet (15 mg total) by mouth at bedtime. - omeprazole (PRILOSEC) 20 MG capsule; Take 1 capsule (20 mg total) by mouth daily. - QUEtiapine (SEROQUEL) 25 MG tablet; Take 1 tablet (25 mg total) by mouth every 6 (six) hours as needed. - QUEtiapine (SEROQUEL) 100 MG tablet; Take 1 tablet (100 mg total) by mouth at bedtime. - traZODone (DESYREL) 100 MG tablet; Take 2 tablets (200 mg total) by mouth at bedtime. - tretinoin (RETIN-A) 0.025 % cream; APPLY TOPICALLY AT BEDTIME.  No follow-ups on file.  Annison Birchard M Santiago, MD [facility] [address] [place], [place] [address] Ph.  [phone] Fax [phone]   

## 2017-09-24 ENCOUNTER — Other Ambulatory Visit: Payer: Self-pay | Admitting: Family Medicine

## 2017-09-25 NOTE — Telephone Encounter (Signed)
Medication refill request pending.

## 2017-09-25 NOTE — Telephone Encounter (Signed)
Refill for controlled med, Trazodone 100 mg tab, LR 09/02/17 for #60 tabs Mirtazapine 15 mg tab, LR 09/02/17 for #30 tabs  Provider  I. Santiago  LOV  09/02/17 Pharmacy  [facility] [phone], [place], [place]

## 2017-09-26 ENCOUNTER — Other Ambulatory Visit: Payer: Self-pay | Admitting: Family Medicine

## 2017-09-26 MED ORDER — TRAZODONE HCL 100 MG PO TABS: 200.0000 mg | ORAL_TABLET | Freq: Every day | ORAL | 0 refills | Status: DC

## 2017-09-29 ENCOUNTER — Other Ambulatory Visit: Payer: Self-pay | Admitting: Family Medicine

## 2017-10-07 ENCOUNTER — Ambulatory Visit: Payer: BLUE CROSS/BLUE SHIELD | Admitting: Family Medicine

## 2017-10-09 ENCOUNTER — Telehealth: Payer: Self-pay | Admitting: Family Medicine

## 2017-10-09 NOTE — Telephone Encounter (Signed)
Refill of seroquel  LOV 09/02/17 Dr. Leretha PolSantiago  LRF 09/02/17  #30  0 refills  : [id number] - [place], [place] -

## 2017-10-15 NOTE — Telephone Encounter (Signed)
Patient needs appointment to refill Seroquel. Was instructed to return around 09/30/17, no appointments scheduled.   Please call patient to schedule.

## 2017-10-17 ENCOUNTER — Telehealth: Payer: Self-pay | Admitting: Family Medicine

## 2017-10-17 ENCOUNTER — Other Ambulatory Visit: Payer: Self-pay | Admitting: Family Medicine

## 2017-10-17 NOTE — Telephone Encounter (Signed)
Left VM on pt's answering machine to inform her she needs an OV for a med refill

## 2017-10-18 NOTE — Telephone Encounter (Signed)
Quetiapine request LV 09/02/17, no upcoming appts

## 2017-10-23 ENCOUNTER — Other Ambulatory Visit: Payer: Self-pay

## 2017-10-23 ENCOUNTER — Encounter: Payer: Self-pay | Admitting: Family Medicine

## 2017-10-23 ENCOUNTER — Ambulatory Visit (INDEPENDENT_AMBULATORY_CARE_PROVIDER_SITE_OTHER): Payer: BLUE CROSS/BLUE SHIELD | Admitting: Family Medicine

## 2017-10-23 VITALS — BP 122/64 | HR 100 | Temp 98.7°F | Ht 67.0 in | Wt 306.5 lb

## 2017-10-23 DIAGNOSIS — F332 Major depressive disorder, recurrent severe without psychotic features: Secondary | ICD-10-CM

## 2017-10-23 DIAGNOSIS — Z3041 Encounter for surveillance of contraceptive pills: Secondary | ICD-10-CM | POA: Diagnosis not present

## 2017-10-23 DIAGNOSIS — R946 Abnormal results of thyroid function studies: Secondary | ICD-10-CM | POA: Diagnosis not present

## 2017-10-23 DIAGNOSIS — Z6841 Body Mass Index (BMI) 40.0 and over, adult: Secondary | ICD-10-CM

## 2017-10-23 DIAGNOSIS — F411 Generalized anxiety disorder: Secondary | ICD-10-CM

## 2017-10-23 DIAGNOSIS — H9201 Otalgia, right ear: Secondary | ICD-10-CM

## 2017-10-23 DIAGNOSIS — F410 Panic disorder [episodic paroxysmal anxiety] without agoraphobia: Secondary | ICD-10-CM

## 2017-10-23 DIAGNOSIS — L819 Disorder of pigmentation, unspecified: Secondary | ICD-10-CM | POA: Diagnosis not present

## 2017-10-23 DIAGNOSIS — Z114 Encounter for screening for human immunodeficiency virus [HIV]: Secondary | ICD-10-CM

## 2017-10-23 MED ORDER — TRETINOIN 0.025 % EX CREA: TOPICAL_CREAM | CUTANEOUS | 11 refills | Status: DC

## 2017-10-23 MED ORDER — CLINDAMYCIN PHOS-BENZOYL PEROX 1-5 % EX GEL: Freq: Two times a day (BID) | CUTANEOUS | 11 refills | Status: DC

## 2017-10-23 MED ORDER — HYDROQUINONE 4 % EX CREA: TOPICAL_CREAM | Freq: Two times a day (BID) | CUTANEOUS | 4 refills | Status: DC

## 2017-10-23 MED ORDER — OMEPRAZOLE 20 MG PO CPDR: 20.0000 mg | DELAYED_RELEASE_CAPSULE | Freq: Every day | ORAL | 1 refills | Status: DC

## 2017-10-23 MED ORDER — NORGESTIMATE-ETH ESTRADIOL 0.25-35 MG-MCG PO TABS: 1.0000 | ORAL_TABLET | Freq: Every day | ORAL | 4 refills | Status: DC

## 2017-10-23 MED ORDER — GABAPENTIN 300 MG PO CAPS: 300.0000 mg | ORAL_CAPSULE | Freq: Every day | ORAL | 1 refills | Status: DC

## 2017-10-23 MED ORDER — QUETIAPINE FUMARATE 100 MG PO TABS: 100.0000 mg | ORAL_TABLET | Freq: Every day | ORAL | 1 refills | Status: DC

## 2017-10-23 MED ORDER — MELATONIN 3 MG PO TABS: 3.0000 mg | ORAL_TABLET | Freq: Every day | ORAL | 1 refills | Status: DC

## 2017-10-23 MED ORDER — MIRTAZAPINE 15 MG PO TABS: ORAL_TABLET | ORAL | 1 refills | Status: DC

## 2017-10-23 MED ORDER — TRAZODONE HCL 100 MG PO TABS: 200.0000 mg | ORAL_TABLET | Freq: Every day | ORAL | 1 refills | Status: DC

## 2017-10-23 MED ORDER — VENLAFAXINE HCL ER 150 MG PO CP24: 150.0000 mg | ORAL_CAPSULE | Freq: Every day | ORAL | 1 refills | Status: DC

## 2017-10-23 NOTE — Progress Notes (Signed)
6/20/201910:00 AM  Kelli BartersJasmine D Howard 10-28-1990, 27 y.o. female [id number]  Chief Complaint  Patient presents with  . Ear Pain    HPI:   Patient is a 27 y.o. female who presents today for routine followup and right ear pain  1. Right ear pain x 3 days, braces recently tightened, mild jaw pain, no fever, no sore throat, no nasal congestion, mild cough 2. Doing really well on psych meds, takes seroquel 25mg  afternoon at work only, cont to see therapist 3. Told she had abnormal TFTs while inpatient, requesting recheck 4. Has lost 15 lbs, on keto diet, walks 4 miles 3-4 times a day 5. Working at call center, doing well, happy 6. Since insurance changed her OCP from brand to generic 4 months ago has had daily spotting, would like to change 7. Requesting refill of bleaching agent. Acne well controlled on  topicals  Fall Risk  10/23/2017 02/25/2017 02/18/2017 02/07/2017 07/09/2016  Falls in the past year? No No No No No     Dep[facility] 2/[facility] 10/23/2017 09/02/2017 02/25/2017  Decreased Interest 0 1 0  Down, Depressed, Hopeless 0 1 0  PHQ - 2 Score 0 2 0  Altered sleeping - 1 0  Tired, decreased energy - 1 0  Change in appetite - 0 0  Feeling bad or failure about yourself  - 1 0  Trouble concentrating - 2 0  Moving slowly or fidgety/restless - 0 0  Suicidal thoughts - 0 0  PHQ-9 Score - 7 0  Difficult doing work/chores - Very difficult -  Some encounter information is confidential and restricted. Go to Review Flowsheets activity to see all data.  Some recent data might be hidden    Allergies  Allergen Reactions  . Peanuts [Peanut Oil] Anaphylaxis    Also: tree nuts   . Other Rash    mushrooms  . Banana     Unknown reaction     Prior to Admission medications   Medication Sig Start Date End Date Taking? Authorizing Provider  clindamycin-benzoyl peroxide (BENZACLIN) gel Apply topically 2 (two) times daily. 09/02/17  Yes Myles LippsSantiago, Irma M, MD  Drospirenone-Ethinyl  Estradiol-Levomefol (BEYAZ) 3-0.02-0.451 MG tablet TAKE 1 TABLET BY MOUTH EVERY DAY 10/01/17  Yes Myles LippsSantiago, Irma M, MD  EPINEPHrine (EPIPEN) 0.3 mg/0.3 mL IJ SOAJ injection Inject 0.3 mLs (0.3 mg total) into the muscle as needed. Brand only 05/31/16  Yes Bing NeighborsHarris, Kimberly S, FNP  gabapentin (NEURONTIN) 300 MG capsule Take 1 capsule (300 mg total) by mouth daily. 09/02/17  Yes Myles LippsSantiago, Irma M, MD  hydroquinone 4 % cream Apply topically 2 (two) times daily.   Yes [provider]  Melatonin 3 MG TABS Take 1 tablet (3 mg total) by mouth at bedtime. 09/02/17  Yes Myles LippsSantiago, Irma M, MD  mirtazapine (REMERON) 15 MG tablet TAKE 1 TABLET BY MOUTH EVERYDAY AT BEDTIME 09/26/17  Yes Myles LippsSantiago, Irma M, MD  omeprazole (PRILOSEC) 20 MG capsule Take 1 capsule (20 mg total) by mouth daily. 09/02/17  Yes Myles LippsSantiago, Irma M, MD  QUEtiapine (SEROQUEL) 100 MG tablet Take 1 tablet (100 mg total) by mouth at bedtime. 09/02/17  Yes Myles LippsSantiago, Irma M, MD  QUEtiapine (SEROQUEL) 25 MG tablet Take 1 tablet (25 mg total) by mouth daily as needed. 10/18/17  Yes Myles LippsSantiago, Irma M, MD  traZODone (DESYREL) 100 MG tablet Take 2 tablets (200 mg total) by mouth at bedtime. 09/26/17  Yes Myles LippsSantiago, Irma M, MD  tretinoin (RETIN-A) 0.025 % cream APPLY  TOPICALLY AT BEDTIME. 09/02/17  Yes Myles Lipps, MD  venlafaxine XR (EFFEXOR-XR) 150 MG 24 hr capsule Take 1 capsule (150 mg total) by mouth daily with breakfast. 09/02/17  Yes Myles Lipps, MD    Past Medical History:  Diagnosis Date  . Allergy   . Anemia   . Enlarged heart   . Heart murmur   . Vitamin D deficiency     Past Surgical History:  Procedure Laterality Date  . DENTAL SURGERY    . TONSILLECTOMY      Social History   Tobacco Use  . Smoking status: Never Smoker  . Smokeless tobacco: Never Used  Substance Use Topics  . Alcohol use: No    Family History  Problem Relation Age of Onset  . Hypertension Mother   . Thyroid disease Mother   . Hypertension Sister     . Heart disease Sister   . Anxiety disorder Brother   . Depression Brother   . ADD / ADHD Other     Review of Systems  Constitutional: Negative for chills and fever.  HENT: Positive for ear pain. Negative for congestion, ear discharge, hearing loss, sinus pain, sore throat and tinnitus.   Respiratory: Positive for cough. Negative for sputum production and shortness of breath.   Psychiatric/Behavioral: Negative for depression and suicidal ideas. The patient is not nervous/anxious and does not have insomnia.    Per hpi  OBJECTIVE:  Blood pressure 122/64, pulse 100, temperature 98.7 F (37.1 C), temperature source Oral, height 5\' 7"  (1.702 m), weight (!) 306 lb 8 oz (139 kg), SpO2 97 %.  Physical Exam  Constitutional: She is oriented to person, place, and time. She appears well-developed and well-nourished.  HENT:  Head: Normocephalic and atraumatic.  Right Ear: Hearing, tympanic membrane, external ear and ear canal normal.  Left Ear: Hearing, tympanic membrane, external ear and ear canal normal.  Mouth/Throat: Oropharynx is clear and moist.  Eyes: Pupils are equal, round, and reactive to light. Conjunctivae and EOM are normal.  Neck: Neck supple. No thyromegaly present.  Cardiovascular: Normal rate, regular rhythm and normal heart sounds. Exam reveals no gallop and no friction rub.  No murmur heard. Pulmonary/Chest: Effort normal and breath sounds normal. She has no wheezes. She has no rales.  Lymphadenopathy:    She has no cervical adenopathy.  Neurological: She is alert and oriented to person, place, and time.  Skin: Skin is warm and dry.  Psychiatric: She has a normal mood and affect.  Nursing note and vitals reviewed.    ASSESSMENT and PLAN  1. Right ear pain No signs of infection. Most likely related to recent tightening of braces, mild TMJ, discussed supportive measures: ice, NSAIDs,   2. Generalized anxiety disorder with panic attacks 3. Severe episode of recurrent  major depressive disorder, without psychotic features (HCC) Doing well, controlled on current regime, will cont. Discussed r/se/b of meds sp weight gain. Cont with therapy - venlafaxine XR (EFFEXOR-XR) 150 MG 24 hr capsule; Take 1 capsule (150 mg total) by mouth daily with breakfast. - gabapentin (NEURONTIN) 300 MG capsule; Take 1 capsule (300 mg total) by mouth daily. -  mirtazapine (REMERON) 15 MG tablet; TAKE 1 TABLET BY MOUTH EVERYDAY AT BEDTIME - QUEtiapine (SEROQUEL) 100 MG tablet; Take 1 tablet (100 mg total) by mouth at bedtime. - traZODone (DESYREL) 100 MG tablet; Take 2 tablets (200 mg total) by mouth at bedtime. - Melatonin 3 MG TABS; Take 1 tablet (3 mg total) by mouth  at bedtime.  4. Discoloration of skin of face - hydroquinone 4 % cream; Apply topically 2 (two) times daily.  5. Visit for birth control pills maintenance Changing to higher dose estrogen. New meds r/se/b reviewed. - norgestimate-ethinyl estradiol (ORTHO-CYCLEN,SPRINTEC,PREVIFEM) 0.25-35 MG-MCG tablet; Take 1 tablet by mouth daily.  6. Abnormal thyroid function test - TSH - T4, Free  7. BMI 45.0-49.9, adult [facility]) Patient engaging on significant LFM. Congratulated. Continue weight loss efforts. - Lipid panel - Hemoglobin A1c - Comprehensive metabolic panel  8. Screening for HIV (human immunodeficiency virus) - HIV antibody  Other orders - clindamycin-benzoyl peroxide (BENZACLIN) gel; Apply topically 2 (two) times daily. - omeprazole (PRILOSEC) 20 MG capsule; Take 1 capsule (20 mg total) by mouth daily. - tretinoin (RETIN-A) 0.025 % cream; APPLY TOPICALLY AT BEDTIME.   Return in about 3 months (around 01/23/2018).    Myles Lipps, MD Primary Care at [facility] [address] [place], [place] [address] Ph.  [phone] Fax [phone]

## 2017-10-23 NOTE — Patient Instructions (Signed)
     IF you received an x-ray today, you will receive an invoice from [facility]. Please contact [facility] at [phone] with questions or concerns regarding your invoice.   IF you received labwork today, you will receive an invoice from [facility]. Please contact [facility] at [phone] with questions or concerns regarding your invoice.   Our billing staff will not be able to assist you with questions regarding bills from these companies.  You will be contacted with the lab results as soon as they are available. The fastest way to get your results is to activate your My Chart account. Instructions are located on the last page of this paperwork. If you have not heard from us regarding the results in 2 weeks, please contact this office.     

## 2017-10-24 LAB — COMPREHENSIVE METABOLIC PANEL
ALT: 30 IU/L (ref 0–32)
AST: 24 IU/L (ref 0–40)
Albumin/Globulin Ratio: 1.3 (ref 1.2–2.2)
Albumin: 4 g/dL (ref 3.5–5.5)
Alkaline Phosphatase: 72 IU/L (ref 39–117)
BUN/Creatinine Ratio: 16 (ref 9–23)
BUN: 14 mg/dL (ref 6–20)
Bilirubin Total: <0.2 mg/dL (ref 0.0–1.2)
CO2: 19 mmol/L — ABNORMAL LOW (ref 20–29)
Calcium: 9.5 mg/dL (ref 8.7–10.2)
Chloride: 106 mmol/L (ref 96–106)
Creatinine, Ser: 0.85 mg/dL (ref 0.57–1.00)
GFR calc Af Amer: 110 mL/min/{1.73_m2} (ref >59)
GFR calc non Af Amer: 96 mL/min/{1.73_m2} (ref >59)
Globulin, Total: 3 g/dL (ref 1.5–4.5)
Glucose: 84 mg/dL (ref 65–99)
Potassium: 4.7 mmol/L (ref 3.5–5.2)
Sodium: 140 mmol/L (ref 134–144)
Total Protein: 7 g/dL (ref 6.0–8.5)

## 2017-10-24 LAB — LIPID PANEL
Chol/HDL Ratio: 2.9 ratio (ref 0.0–4.4)
Cholesterol, Total: 171 mg/dL (ref 100–199)
HDL: 58 mg/dL (ref >39)
LDL Calculated: 85 mg/dL (ref 0–99)
Triglycerides: 142 mg/dL (ref 0–149)
VLDL Cholesterol Cal: 28 mg/dL (ref 5–40)

## 2017-10-24 LAB — HIV ANTIBODY (ROUTINE TESTING W REFLEX): HIV Screen 4th Generation wRfx: NONREACTIVE

## 2017-10-24 LAB — HEMOGLOBIN A1C
Est. average glucose Bld gHb Est-mCnc: 111 mg/dL
Hgb A1c MFr Bld: 5.5 % (ref 4.8–5.6)

## 2017-10-24 LAB — TSH: TSH: 2.08 u[IU]/mL (ref 0.450–4.500)

## 2017-10-24 LAB — T4, FREE: Free T4: 0.99 ng/dL (ref 0.82–1.77)

## 2017-11-01 NOTE — Telephone Encounter (Signed)
Copied from CRM [id number]. Topic: General - Other >> Oct 31, 2017  4:33 PM Mcneil, Ja-Kwan wrote: Reason for CRM: Pt asking for lab results. Pt requests return call. Cb# [phone]  Message sent to Dr Leretha PolSantiago

## 2017-11-04 NOTE — Telephone Encounter (Signed)
Copied from CRM [phone]. Topic: General - Other >> Nov 04, 2017  9:01 AM Tamela OddiHarris, Brenda J wrote: Patient called again for her lab results.  She has not gotten a call back and would like her results today.  CB# [phone].

## 2017-11-04 NOTE — Telephone Encounter (Signed)
Results posted on my chart. Called patient and discussed.

## 2017-11-14 ENCOUNTER — Other Ambulatory Visit: Payer: Self-pay | Admitting: Family Medicine

## 2017-11-14 NOTE — Telephone Encounter (Signed)
Quetiapine refill Last Refill:10/18/17 # 30 1 RF Last OV: 10/23/17 PCP: Dr Creta LevinStallings Pharmacy:[facility] [address]

## 2017-11-20 NOTE — Telephone Encounter (Signed)
Quetiapine refill Last Refill:10/18/17 # 30 1 RF Last OV: 10/23/17 Pharmacy:[facility] [address]

## 2017-12-12 ENCOUNTER — Ambulatory Visit (INDEPENDENT_AMBULATORY_CARE_PROVIDER_SITE_OTHER): Payer: BLUE CROSS/BLUE SHIELD | Admitting: Family Medicine

## 2017-12-12 ENCOUNTER — Telehealth: Payer: Self-pay | Admitting: Family Medicine

## 2017-12-12 ENCOUNTER — Other Ambulatory Visit: Payer: Self-pay

## 2017-12-12 ENCOUNTER — Encounter: Payer: Self-pay | Admitting: Family Medicine

## 2017-12-12 VITALS — BP 135/82 | HR 108 | Temp 98.2°F | Resp 16 | Ht 67.0 in | Wt 313.0 lb

## 2017-12-12 DIAGNOSIS — F411 Generalized anxiety disorder: Secondary | ICD-10-CM | POA: Diagnosis not present

## 2017-12-12 DIAGNOSIS — G43001 Migraine without aura, not intractable, with status migrainosus: Secondary | ICD-10-CM | POA: Diagnosis not present

## 2017-12-12 DIAGNOSIS — F410 Panic disorder [episodic paroxysmal anxiety] without agoraphobia: Secondary | ICD-10-CM

## 2017-12-12 MED ORDER — KETOROLAC TROMETHAMINE 60 MG/2ML IM SOLN
60.0000 mg | Freq: Once | INTRAMUSCULAR | Status: AC
  Administered 2017-12-12: 60 mg via INTRAMUSCULAR

## 2017-12-12 MED ORDER — ONDANSETRON 4 MG PO TBDP: 4.0000 mg | ORAL_TABLET | Freq: Once | ORAL | Status: DC

## 2017-12-12 NOTE — Telephone Encounter (Signed)
Called both home and cell phone and both mailboxes are full so I wasn't able to leave a voicemail. But I was calling in regards to Kelli Howard appt she has with Dr. Leretha PolSantiago on 01/27/2018. The provider will not be in on that day and would need to be rescheduled.

## 2017-12-12 NOTE — Progress Notes (Signed)
Chief Complaint  Patient presents with  . URI    chest congestion x 1 month and nausea for a few months     HPI  Migraines Pt reports that she has been feeling bad for the past month She has been having headaches with nausea She states that she has a headache on the left forehead above the eye For a couple months now Her pain is 6/10 She reports that sleeping seems to help the pain as well as tylenol No LMP recorded. (Menstrual status: Oral contraceptives). 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 Leretha Pol for her Primary Care but had not discussed it before   Anxiety She states that the depression and anxiety medication seems to not work and the headaches sets off anxiety. She has been having more frequent panic attacks and effexor is not working.   No hallucinations No SI    Past Medical History:  Diagnosis Date  . Allergy   . Anemia   . Enlarged heart   . Heart murmur   . Vitamin D deficiency     Current Outpatient Medications  Medication Sig Dispense Refill  . clindamycin-benzoyl peroxide (BENZACLIN) gel Apply topically 2 (two) times daily. 50 g 11  . EPINEPHrine (EPIPEN) 0.3 mg/0.3 mL IJ SOAJ injection Inject 0.3 mLs (0.3 mg total) into the muscle as needed. Brand only 2 Device 12  . gabapentin (NEURONTIN) 300 MG capsule Take 1 capsule (300 mg total) by mouth daily. 90 capsule 1  . hydroquinone 4 % cream Apply topically 2 (two) times daily. 28.35 g 4  . Melatonin 3 MG TABS Take 1 tablet (3 mg total) by mouth at bedtime. 90 tablet 1  . mirtazapine (REMERON) 15 MG tablet TAKE 1 TABLET BY MOUTH EVERYDAY AT BEDTIME 90 tablet 1  . norgestimate-ethinyl estradiol (ORTHO-CYCLEN,SPRINTEC,PREVIFEM) 0.25-35 MG-MCG tablet Take 1 tablet by mouth daily. 3 Package 4  . omeprazole (PRILOSEC) 20 MG capsule Take 1  capsule (20 mg total) by mouth daily. 90 capsule 1  . QUEtiapine (SEROQUEL) 100 MG tablet Take 1 tablet (100 mg total) by mouth at bedtime. 90 tablet 1  . QUEtiapine (SEROQUEL) 25 MG tablet TAKE 1 TABLET (25 MG TOTAL) BY MOUTH DAILY AS NEEDED. 90 tablet 1  . traZODone (DESYREL) 100 MG tablet Take 2 tablets (200 mg total) by mouth at bedtime. 180 tablet 1  . tretinoin (RETIN-A) 0.025 % cream APPLY TOPICALLY AT BEDTIME. 45 g 11  . venlafaxine XR (EFFEXOR-XR) 150 MG 24 hr capsule Take 1 capsule (150 mg total) by mouth daily with breakfast. 90 capsule 1   Current Facility-Administered Medications  Medication Dose Route Frequency Provider Last Rate Last Dose  . ketorolac (TORADOL) injection 60 mg  60 mg Intramuscular Once Janeese Mcgloin A, MD      . ondansetron (ZOFRAN-ODT) disintegrating tablet 4 mg  4 mg Oral Once Doristine Bosworth, MD        Allergies:  Allergies  Allergen Reactions  . Peanuts [Peanut Oil] Anaphylaxis    Also: tree nuts   . Other Rash    mushrooms  . Banana     Unknown reaction     Past Surgical History:  Procedure Laterality Date  . DENTAL SURGERY    . TONSILLECTOMY      Social History   Socioeconomic History  .  Marital status: Single    Spouse name: Not on file  . Number of children: Not on file  . Years of education: Not on file  . Highest education level: Not on file  Occupational History  . Not on file  Social Needs  . Financial resource strain: Somewhat hard  . Food insecurity:    Worry: Sometimes true    Inability: Sometimes true  . Transportation needs:    Medical: No    Non-medical: No  Tobacco Use  . Smoking status: Never Smoker  . Smokeless tobacco: Never Used  Substance and Sexual Activity  . Alcohol use: No  . Drug use: Yes    Frequency: 2.0 times per week    Types: Marijuana    Comment: None over the past monthh  . Sexual activity: Not Currently  Lifestyle  . Physical activity:    Days per week: 1 day    Minutes per session: 30  min  . Stress: Rather much  Relationships  . Social connections:    Talks on phone: More than three times a week    Gets together: More than three times a week    Attends religious service: Never    Active member of club or organization: No    Attends meetings of clubs or organizations: Never    Relationship status: Never married  Other Topics Concern  . Not on file  Social History Narrative  . Not on file    Family History  Problem Relation Age of Onset  . Hypertension Mother   . Thyroid disease Mother   . Hypertension Sister   . Heart disease Sister   . Anxiety disorder Brother   . Depression Brother   . ADD / ADHD Other      ROS Review of Systems See HPI Constitution: No fevers or chills No malaise No diaphoresis Skin: No rash or itching Eyes: no blurry vision, no double vision GU: no dysuria or hematuria Neuro: no dizziness, ++headaches - see hpi all others reviewed and negative   Objective: Vitals:   12/12/17 1538  BP: 135/82  Pulse: (!) 108  Resp: 16  Temp: 98.2 F (36.8 C)  SpO2: 96%  Weight: (!) 313 lb (142 kg)  Height: 5\' 7"  (1.702 m)    Physical Exam  Constitutional: She is oriented to person, place, and time. She appears well-developed and well-nourished.  HENT:  Head: Normocephalic and atraumatic.  Eyes: Pupils are equal, round, and reactive to light. Conjunctivae and EOM are normal. Right pupil is round and reactive. Left pupil is round and reactive. Pupils are equal.  Fundoscopic exam:      The right eye shows no AV nicking, no exudate, no hemorrhage and no papilledema.       The left eye shows no AV nicking, no exudate, no hemorrhage and no papilledema.  Neck: Normal range of motion. Neck supple.  Cardiovascular: Normal rate, regular rhythm and normal heart sounds.  Pulmonary/Chest: Effort normal and breath sounds normal. No stridor. No respiratory distress. She has no wheezes.  Neurological: She is alert and oriented to person, place,  and time. She has normal strength. No cranial nerve deficit or sensory deficit. She exhibits normal muscle tone. She displays a negative Romberg sign.  Skin: Skin is warm. Capillary refill takes less than 2 seconds.  Psychiatric: She has a normal mood and affect. Her behavior is normal. Judgment and thought content normal.    Assessment and Plan Kenli was seen today for uri.  Diagnoses and all orders for this visit:  Migraine without aura and with status migrainosus, not intractable -     ketorolac (TORADOL) injection 60 mg -     ondansetron (ZOFRAN-ODT) disintegrating tablet 4 mg Will give office dose of zofran and toradol Discussed with patient that she can take an extra gabapentin dose to help with headaches Would not recommend a triptan at this point due to her other meds that she is on    Generalized anxiety disorder with panic attacks discussed with patient that she should return to clinic to discuss her anxiety and panic attacks with her PCP     Doristine BosworthZoe A Dezyrae Kensinger

## 2017-12-12 NOTE — Patient Instructions (Addendum)
   IF you received an x-ray today, you will receive an invoice from [facility]. Please contact [facility] at [phone] with questions or concerns regarding your invoice.   IF you received labwork today, you will receive an invoice from [facility]. Please contact [facility] at [phone] with questions or concerns regarding your invoice.   Our billing staff will not be able to assist you with questions regarding bills from these companies.  You will be contacted with the lab results as soon as they are available. The fastest way to get your results is to activate your My Chart account. Instructions are located on the last page of this paperwork. If you have not heard from us regarding the results in 2 weeks, please contact this office.      Migraine Headache A migraine headache is an intense, throbbing pain on one side or both sides of the head. Migraines may also cause other symptoms, such as nausea, vomiting, and sensitivity to light and noise. What are the causes? Doing or taking certain things may also trigger migraines, such as:  Alcohol.  Smoking.  Medicines, such as: ? Medicine used to treat chest pain (nitroglycerine). ? Birth control pills. ? Estrogen pills. ? Certain blood pressure medicines.  Aged cheeses, chocolate, or caffeine.  Foods or drinks that contain nitrates, glutamate, aspartame, or tyramine.  Physical activity.  Other things that may trigger a migraine include:  Menstruation.  Pregnancy.  Hunger.  Stress, lack of sleep, too much sleep, or fatigue.  Weather changes.  What increases the risk? The following factors may make you more likely to experience migraine headaches:  Age. Risk increases with age.  Family history of migraine headaches.  Being Caucasian.  Depression and anxiety.  Obesity.  Being a woman.  Having a hole in the heart (patent foramen ovale) or other heart problems.  What are the signs or  symptoms? The main symptom of this condition is pulsating or throbbing pain. Pain may:  Happen in any area of the head, such as on one side or both sides.  Interfere with daily activities.  Get worse with physical activity.  Get worse with exposure to bright lights or loud noises.  Other symptoms may include:  Nausea.  Vomiting.  Dizziness.  General sensitivity to bright lights, loud noises, or smells.  Before you get a migraine, you may get warning signs that a migraine is developing (aura). An aura may include:  Seeing flashing lights or having blind spots.  Seeing bright spots, halos, or zigzag lines.  Having tunnel vision or blurred vision.  Having numbness or a tingling feeling.  Having trouble talking.  Having muscle weakness.  How is this diagnosed? A migraine headache can be diagnosed based on:  Your symptoms.  A physical exam.  Tests, such as CT scan or MRI of the head. These imaging tests can help rule out other causes of headaches.  Taking fluid from the spine (lumbar puncture) and analyzing it (cerebrospinal fluid analysis, or CSF analysis).  How is this treated? A migraine headache is usually treated with medicines that:  Relieve pain.  Relieve nausea.  Prevent migraines from coming back.  Treatment may also include:  Acupuncture.  Lifestyle changes like avoiding foods that trigger migraines.  Follow these instructions at home: Medicines  Take over-the-counter and prescription medicines only as told by your health care provider.  Do not drive or use heavy machinery while taking prescription pain medicine.  To prevent or treat constipation while you   are taking prescription pain medicine, your health care provider may recommend that you: ? Drink enough fluid to keep your urine clear or pale yellow. ? Take over-the-counter or prescription medicines. ? Eat foods that are high in fiber, such as fresh fruits and vegetables, whole grains,  and beans. ? Limit foods that are high in fat and processed sugars, such as fried and sweet foods. Lifestyle  Avoid alcohol use.  Do not use any products that contain nicotine or tobacco, such as cigarettes and e-cigarettes. If you need help quitting, ask your health care provider.  Get at least 8 hours of sleep every night.  Limit your stress. General instructions   Keep a journal to find out what may trigger your migraine headaches. For example, write down: ? What you eat and drink. ? How much sleep you get. ? Any change to your diet or medicines.  If you have a migraine: ? Avoid things that make your symptoms worse, such as bright lights. ? It may help to lie down in a dark, quiet room. ? Do not drive or use heavy machinery. ? Ask your health care provider what activities are safe for you while you are experiencing symptoms.  Keep all follow-up visits as told by your health care provider. This is important. Contact a health care provider if:  You develop symptoms that are different or more severe than your usual migraine symptoms. Get help right away if:  Your migraine becomes severe.  You have a fever.  You have a stiff neck.  You have vision loss.  Your muscles feel weak or like you cannot control them.  You start to lose your balance often.  You develop trouble walking.  You faint. This information is not intended to replace advice given to you by your health care provider. Make sure you discuss any questions you have with your health care provider. Document Released: 04/22/2005 Document Revised: 11/10/2015 Document Reviewed: 10/09/2015 Elsevier Interactive Patient Education  2017 [employer]vier Inc.  

## 2017-12-14 DIAGNOSIS — G43001 Migraine without aura, not intractable, with status migrainosus: Secondary | ICD-10-CM | POA: Insufficient documentation

## 2017-12-14 DIAGNOSIS — F410 Panic disorder [episodic paroxysmal anxiety] without agoraphobia: Secondary | ICD-10-CM | POA: Insufficient documentation

## 2017-12-14 DIAGNOSIS — F411 Generalized anxiety disorder: Secondary | ICD-10-CM

## 2017-12-16 ENCOUNTER — Ambulatory Visit: Payer: BLUE CROSS/BLUE SHIELD | Admitting: Family Medicine

## 2017-12-24 ENCOUNTER — Ambulatory Visit: Payer: BLUE CROSS/BLUE SHIELD | Admitting: Family Medicine

## 2017-12-24 ENCOUNTER — Other Ambulatory Visit: Payer: Self-pay

## 2017-12-24 ENCOUNTER — Encounter: Payer: Self-pay | Admitting: Family Medicine

## 2017-12-24 ENCOUNTER — Ambulatory Visit (INDEPENDENT_AMBULATORY_CARE_PROVIDER_SITE_OTHER): Payer: BLUE CROSS/BLUE SHIELD | Admitting: Family Medicine

## 2017-12-24 VITALS — BP 126/76 | HR 115 | Temp 98.5°F | Resp 16 | Ht 67.0 in | Wt 312.8 lb

## 2017-12-24 DIAGNOSIS — F39 Unspecified mood [affective] disorder: Secondary | ICD-10-CM | POA: Diagnosis not present

## 2017-12-24 NOTE — Patient Instructions (Addendum)
  [facility] Address: [address], [place], [place] [address] Phone: [phone]  1. Start by taking 1/2 tab mirtazapine at bedtime x 1 week then stop  2. Next medication to wean would be trazadone, go down by 1/2 tab a week, until done   If you have lab work done today you will be contacted with your lab results within the next 2 weeks.  If you have not heard from us then please contact us. The fastest way to get your results is to register for My Chart.   IF you received an x-ray today, you will receive an invoice from [facility]. Please contact [facility] at [phone] with questions or concerns regarding your invoice.   IF you received labwork today, you will receive an invoice from [facility]. Please contact [facility] at [phone] with questions or concerns regarding your invoice.   Our billing staff will not be able to assist you with questions regarding bills from these companies.  You will be contacted with the lab results as soon as they are available. The fastest way to get your results is to activate your My Chart account. Instructions are located on the last page of this paperwork. If you have not heard from us regarding the results in 2 weeks, please contact this office.

## 2017-12-24 NOTE — Progress Notes (Signed)
8/21/20192:49 PM  Kelli Howard 22-Mar-1991, 27 y.o. female [id number]  Chief Complaint  Patient presents with  . 2 Week Follow-up    panic attacks are the same, wants medication reduce     HPI:   Patient is a 27 y.o. female with past medical history significant for mood disorder who presents today for adjustment of meds  Racing thoughts, easily overwhelmed, irritable, crying over nothing Then with periods of time when she feels great, wants to go hiking and plans all types of activities, will work for over 100 hours Then crashes again, cant even make simple decisions Has not been able to work consistently of recent Feels her mood is all over the place, has no control of her emotions She also feels that she is over medicated and many are not working anymore, she would like to start weaning off some of them  Has gained 70 lbs since she [facility] Told she has either borderline personality disorder or bipolar disorder  25mg  of seroquel still help when she feels very overwhelmed Not seeing psych or therapy  Denies SI  Here with her mother   Fall Risk  12/24/2017 12/12/2017 10/23/2017 02/25/2017 02/18/2017  Falls in the past year? No No No No No     [facility] 2/9 12/24/2017 12/12/2017 10/23/2017  Decreased Interest 0 2 0  Down, Depressed, Hopeless 0 2 0  PHQ - 2 Score 0 4 0  Altered sleeping - 2 -  Tired, decreased energy - 3 -  Change in appetite - 2 -  Feeling bad or failure about yourself  - 1 -  Trouble concentrating - 2 -  Moving slowly or fidgety/restless - 2 -  Suicidal thoughts - 0 -  PHQ-9 Score - 16 -  Difficult doing work/chores - Very difficult -  Some encounter information is confidential and restricted. Go to Review Flowsheets activity to see all data.  Some recent data might be hidden    Allergies  Allergen Reactions  . Peanuts [Peanut Oil] Anaphylaxis    Also: tree nuts   . Other Rash    mushrooms  .  Banana     Unknown reaction     Prior to Admission medications   Medication Sig Start Date End Date Taking? Authorizing Provider  clindamycin-benzoyl peroxide (BENZACLIN) gel Apply topically 2 (two) times daily. 10/23/17  Yes Myles LippsSantiago, Shondrea Steinert M, MD  EPINEPHrine (EPIPEN) 0.3 mg/0.3 mL IJ SOAJ injection Inject 0.3 mLs (0.3 mg total) into the muscle as needed. Brand only 05/31/16  Yes Bing NeighborsHarris, Kimberly S, FNP  gabapentin (NEURONTIN) 300 MG capsule Take 1 capsule (300 mg total) by mouth daily. 10/23/17  Yes Myles LippsSantiago, Jacquan Savas M, MD  hydroquinone 4 % cream Apply topically 2 (two) times daily. 10/23/17  Yes Myles LippsSantiago, Avery Eustice M, MD  Melatonin 3 MG TABS Take 1 tablet (3 mg total) by mouth at bedtime. 10/23/17  Yes Myles LippsSantiago, Melchor Kirchgessner M, MD  mirtazapine (REMERON) 15 MG tablet TAKE 1 TABLET BY MOUTH EVERYDAY AT BEDTIME 10/23/17  Yes Myles LippsSantiago, Ling Flesch M, MD  norgestimate-ethinyl estradiol (ORTHO-CYCLEN,SPRINTEC,PREVIFEM) 0.25-35 MG-MCG tablet Take 1 tablet by mouth daily. 10/23/17  Yes Myles LippsSantiago, Jajuan Skoog M, MD  omeprazole (PRILOSEC) 20 MG capsule Take 1 capsule (20 mg total) by mouth daily. 10/23/17  Yes Myles LippsSantiago, Valor Quaintance M, MD  QUEtiapine (SEROQUEL) 100 MG tablet Take 1 tablet (100 mg total) by mouth at bedtime. 10/23/17  Yes Myles LippsSantiago, Fitzpatrick Alberico M, MD  QUEtiapine (SEROQUEL) 25 MG tablet  TAKE 1 TABLET (25 MG TOTAL) BY MOUTH DAILY AS NEEDED. 11/21/17  Yes Myles LippsSantiago, Nikya Busler M, MD  traZODone (DESYREL) 100 MG tablet Take 2 tablets (200 mg total) by mouth at bedtime. 10/23/17  Yes Myles LippsSantiago, Martyn Timme M, MD  tretinoin (RETIN-A) 0.025 % cream APPLY TOPICALLY AT BEDTIME. 10/23/17  Yes Myles LippsSantiago, Ruie Sendejo M, MD  venlafaxine XR (EFFEXOR-XR) 150 MG 24 hr capsule Take 1 capsule (150 mg total) by mouth daily with breakfast. 10/23/17  Yes Myles LippsSantiago, Shalon Salado M, MD    Past Medical History:  Diagnosis Date  . Allergy   . Anemia   . Enlarged heart   . Heart murmur   . Vitamin D deficiency     Past Surgical History:  Procedure Laterality Date  . DENTAL SURGERY    .  TONSILLECTOMY      Social History   Tobacco Use  . Smoking status: Never Smoker  . Smokeless tobacco: Never Used  Substance Use Topics  . Alcohol use: No    Family History  Problem Relation Age of Onset  . Hypertension Mother   . Thyroid disease Mother   . Hypertension Sister   . Heart disease Sister   . Anxiety disorder Brother   . Depression Brother   . ADD / ADHD Other     ROS Per hpi  OBJECTIVE:  Blood pressure 126/76, pulse (!) 115, temperature 98.5 F (36.9 C), resp. rate 16, height 5\' 7"  (1.702 m), weight (!) 312 lb 12.8 oz (141.9 kg), SpO2 96 %. Body mass index is 48.99 kg/m.   Wt Readings from Last 3 Encounters:  12/24/17 (!) 312 lb 12.8 oz (141.9 kg)  12/12/17 (!) 313 lb (142 kg)  10/23/17 (!) 306 lb 8 oz (139 kg)   Oct 2019 296 lbs Aug 2017 267 lbs  Physical Exam  Constitutional: She is oriented to person, place, and time. She appears well-developed and well-nourished.  HENT:  Head: Normocephalic and atraumatic.  Mouth/Throat: Mucous membranes are normal.  Eyes: Pupils are equal, round, and reactive to light. EOM are normal. No scleral icterus.  Neck: Neck supple.  Pulmonary/Chest: Effort normal.  Neurological: She is alert and oriented to person, place, and time.  Skin: Skin is warm and dry.  Psychiatric: She has a normal mood and affect.  Nursing note and vitals reviewed.   ASSESSMENT and PLAN  1. Mood disorder (HCC) More than 50% of this 25 min visit was spent with education regarding possible diagnosis, treatment options and coordination of care  Discussed with patient that seems bipolar very likely per described symptoms. Therefore will start weaning off remeron and trazadone. Will defer to psych once established.  - Ambulatory referral to Psychiatry  Return in about 4 weeks (around 01/21/2018).    Myles LippsIrma M Santiago, MD [facility] at [facility] [address] [place], [place] [address] Ph.  [phone] Fax [phone]

## 2018-01-14 ENCOUNTER — Encounter: Payer: Self-pay | Admitting: Family Medicine

## 2018-01-15 ENCOUNTER — Ambulatory Visit (INDEPENDENT_AMBULATORY_CARE_PROVIDER_SITE_OTHER): Payer: BLUE CROSS/BLUE SHIELD | Admitting: Family Medicine

## 2018-01-15 ENCOUNTER — Encounter: Payer: Self-pay | Admitting: Family Medicine

## 2018-01-15 ENCOUNTER — Other Ambulatory Visit: Payer: Self-pay

## 2018-01-15 VITALS — BP 123/80 | HR 107 | Temp 98.0°F | Ht 67.0 in | Wt 313.0 lb

## 2018-01-15 DIAGNOSIS — F39 Unspecified mood [affective] disorder: Secondary | ICD-10-CM

## 2018-01-15 MED ORDER — LAMOTRIGINE 25 MG PO TABS: 25.0000 mg | ORAL_TABLET | Freq: Every day | ORAL | 0 refills | Status: DC

## 2018-01-15 NOTE — Progress Notes (Signed)
9/12/20195:24 PM  Kelli Howard 1991/01/18, 27 y.o. female [id number]  Chief Complaint  Patient presents with  . Anxiety  . Depression    PHQ9=23 (Constant feeling of sadness and depression)     HPI:   Patient is a 27 y.o. female with past medical history significant for mood disorder who presents today for followup  Off mirtazapine 15mg  and effexor 150mg  trazadone 100mg  at bedtime seroquel 100mg  in the morning Gabapentin taking every other day  Past treatments: zoloft 100mg  - weight gain, did not work lexapro 20mg  - made anxiety worse Vistaril 25mg  - did not work Clonidine 0.1mg  at bedtime Several attempts at outpatient counseling - does not find useful Inpatient psych in April 2019  Has appt with psych in November  Started seeing me in oct 2018 In the past dx with depression, anxiety and borderline personality disorder H/o childhood trauma, evaluated by psych at age 30  Having suicides thoughts, no intent, no plans, more along " this really sucks"  This current worsening started in august, has not been able to work since then, has called multiple times for her mother to come get her from work, continues to have significant mood swings From my note on 12/24/17: Racing thoughts, easily overwhelmed, irritable, crying over nothing Then with periods of time when she feels great, wants to go hiking and plans all types of activities, will work for over 100 hours Then crashes again, cant even make simple decisions Feels her mood is all over the place, has no control of her emotions Having problems with dating due to mood swings, states that she recognizes it her hard for anybody to be with her during this time as they do not know what to expect.  + MDQ9  Does not want to go back into inpatient, feels that it just made her sleep from all the medications: [place] [facility]  Mother states that when she was on the medication things were really bad - she was very  irritable, felt not worthy and everything was her fault and now things are getting worse, now it is like she is wasting air  Has not engaged in cutting behavior in about 3 months  Mom has moved to be closer to her, manages her medications Mother very supportive, very good relationship with her mother Sleeping well with trazodone  Requesting FMLA paperwork  Fall Risk  01/15/2018 12/24/2017 12/12/2017 10/23/2017 02/25/2017  Falls in the past year? No No No No No     Depression screen [facility] 2/9 01/15/2018 12/24/2017 12/12/2017  Decreased Interest 3 0 2  Down, Depressed, Hopeless 3 0 2  PHQ - 2 Score 6 0 4  Altered sleeping 3 - 2  Tired, decreased energy 3 - 3  Change in appetite 3 - 2  Feeling bad or failure about yourself  3 - 1  Trouble concentrating 3 - 2  Moving slowly or fidgety/restless 0 - 2  Suicidal thoughts 2 - 0  PHQ-9 Score 23 - 16  Difficult doing work/chores Extremely dIfficult - Very difficult  Some encounter information is confidential and restricted. Go to Review Flowsheets activity to see all data.  Some recent data might be hidden    Allergies  Allergen Reactions  . Peanuts [Peanut Oil] Anaphylaxis    Also: tree nuts   . Other Rash    mushrooms  . Banana     Unknown reaction   . Shellfish Allergy Rash    Prior to Admission medications  Medication Sig Start Date End Date Taking? Authorizing Provider  clindamycin-benzoyl peroxide (BENZACLIN) gel Apply topically 2 (two) times daily. 10/23/17  Yes Myles LippsSantiago, Irma M, MD  EPINEPHrine (EPIPEN) 0.3 mg/0.3 mL IJ SOAJ injection Inject 0.3 mLs (0.3 mg total) into the muscle as needed. Brand only 05/31/16  Yes Bing NeighborsHarris, Kimberly S, FNP  gabapentin (NEURONTIN) 300 MG capsule Take 1 capsule (300 mg total) by mouth daily. Patient taking differently: Take 300 mg by mouth every other day.  10/23/17  Yes Myles LippsSantiago, Irma M, MD  hydroquinone 4 % cream Apply topically 2 (two) times daily. 10/23/17  Yes Myles LippsSantiago, Irma M, MD  Melatonin 3 MG  TABS Take 1 tablet (3 mg total) by mouth at bedtime. 10/23/17  Yes Myles LippsSantiago, Irma M, MD  norgestimate-ethinyl estradiol (ORTHO-CYCLEN,SPRINTEC,PREVIFEM) 0.25-35 MG-MCG tablet Take 1 tablet by mouth daily. 10/23/17  Yes Myles LippsSantiago, Irma M, MD  omeprazole (PRILOSEC) 20 MG capsule Take 1 capsule (20 mg total) by mouth daily. 10/23/17  Yes Myles LippsSantiago, Irma M, MD  QUEtiapine (SEROQUEL) 100 MG tablet Take 1 tablet (100 mg total) by mouth at bedtime. 10/23/17  Yes Myles LippsSantiago, Irma M, MD  QUEtiapine (SEROQUEL) 25 MG tablet TAKE 1 TABLET (25 MG TOTAL) BY MOUTH DAILY AS NEEDED. 11/21/17  Yes Myles LippsSantiago, Irma M, MD  traZODone (DESYREL) 100 MG tablet Take 2 tablets (200 mg total) by mouth at bedtime. 10/23/17  Yes Myles LippsSantiago, Irma M, MD  tretinoin (RETIN-A) 0.025 % cream APPLY TOPICALLY AT BEDTIME. 10/23/17  Yes Myles LippsSantiago, Irma M, MD  venlafaxine XR (EFFEXOR-XR) 150 MG 24 hr capsule Take 1 capsule (150 mg total) by mouth daily with breakfast. 10/23/17  Yes Myles LippsSantiago, Irma M, MD    Past Medical History:  Diagnosis Date  . Allergy   . Anemia   . Enlarged heart   . Heart murmur   . Vitamin D deficiency     Past Surgical History:  Procedure Laterality Date  . DENTAL SURGERY    . TONSILLECTOMY      Social History   Tobacco Use  . Smoking status: Never Smoker  . Smokeless tobacco: Never Used  Substance Use Topics  . Alcohol use: No    Family History  Problem Relation Age of Onset  . Hypertension Mother   . Thyroid disease Mother   . Hypertension Sister   . Heart disease Sister   . Anxiety disorder Brother   . Depression Brother   . ADD / ADHD Other     ROS Per hpi  OBJECTIVE:  Blood pressure 123/80, pulse (!) 107, temperature 98 F (36.7 C), temperature source Oral, height 5\' 7"  (1.702 m), weight (!) 313 lb (142 kg), SpO2 99 %. Body mass index is 49.02 kg/m.   Physical Exam  Constitutional: She is oriented to person, place, and time. She appears well-developed and well-nourished.  HENT:  Head:  Normocephalic and atraumatic.  Mouth/Throat: Mucous membranes are normal.  Eyes: Pupils are equal, round, and reactive to light. Conjunctivae and EOM are normal. No scleral icterus.  Neck: Neck supple.  Pulmonary/Chest: Effort normal.  Neurological: She is alert and oriented to person, place, and time.  Skin: Skin is warm and dry.  Psychiatric: Her speech is normal. Thought content normal. Her affect is blunt. She is slowed. Cognition and memory are normal.  Nursing note and vitals reviewed.    ASSESSMENT and PLAN  1. Mood disorder [facility](HCC) Patient currently with passive SI and severe depression scores in setting of recurring mood changes that are made worse  by first line agents for depression and that seem to be happening quickly. She does not endorse sense of abandonment nor unstable personal relationships nor emotional reactivity. Discussed with patient that I think she might have a bipolar disorder and would like to treat her accordingly until she is able to see psych. Unable to do depakote due to cross reactivity with her peanut allergy. Discussed lithium and lamictal r/se/b. Decided to do trial for lamictal. Discussed psych ER precautions Other orders - lamoTRIgine (LAMICTAL) 25 MG tablet; Take 1 tablet (25 mg total) by mouth daily.  Return in about 2 weeks (around 01/29/2018).    Myles Lipps, MD Primary Care at [facility] [address] [place], [place] [address] Ph.  [phone] Fax [phone]

## 2018-01-15 NOTE — Patient Instructions (Signed)
° ° ° °  If you have lab work done today you will be contacted with your lab results within the next 2 weeks.  If you have not heard from us then please contact us. The fastest way to get your results is to register for My Chart. ° ° °IF you received an x-ray today, you will receive an invoice from [facility]. Please contact [facility] at [phone] with questions or concerns regarding your invoice.  ° °IF you received labwork today, you will receive an invoice from [facility]. Please contact [facility] at [phone] with questions or concerns regarding your invoice.  ° °Our billing staff will not be able to assist you with questions regarding bills from these companies. ° °You will be contacted with the lab results as soon as they are available. The fastest way to get your results is to activate your My Chart account. Instructions are located on the last page of this paperwork. If you have not heard from us regarding the results in 2 weeks, please contact this office. °  ° ° ° °

## 2018-01-20 ENCOUNTER — Telehealth: Payer: Self-pay | Admitting: Family Medicine

## 2018-01-20 NOTE — Telephone Encounter (Signed)
Copied from CRM [id number]. Topic: Quick Communication - See Telephone Encounter >> Jan 20, 2018  9:18 AM Herby AbrahamJohnson, Shiquita C wrote: CRM for notification. See Telephone encounter for: 01/20/18.  Pt called in to check the status of paperwork by Dr. Leretha PolSantiago, pt says that it was disability paperwork.   Please advise.   CB: [phone]

## 2018-01-26 NOTE — Telephone Encounter (Signed)
Patient called to check on the status of her disabilty paperwork.  I called [place] and left a message on the disabilty desk on the patients dehalf.  Please advise

## 2018-01-27 ENCOUNTER — Ambulatory Visit: Payer: BLUE CROSS/BLUE SHIELD | Admitting: Family Medicine

## 2018-01-27 NOTE — Telephone Encounter (Signed)
Pt states disability paperwork was to be at front desk for pick up today at noon. Pt has called 2x (both back to back). I advised her to check with Luther ParodyCaitlin is back from lunch ([facility]) and to call after 1:30pm. Pt may have her father Kelli Howard pick it up. Ok to give to him.

## 2018-01-28 ENCOUNTER — Ambulatory Visit: Payer: BLUE CROSS/BLUE SHIELD | Admitting: Emergency Medicine

## 2018-01-28 ENCOUNTER — Ambulatory Visit: Payer: BLUE CROSS/BLUE SHIELD | Admitting: Family Medicine

## 2018-01-29 ENCOUNTER — Ambulatory Visit: Payer: BLUE CROSS/BLUE SHIELD | Admitting: Family Medicine

## 2018-01-29 ENCOUNTER — Ambulatory Visit (INDEPENDENT_AMBULATORY_CARE_PROVIDER_SITE_OTHER): Payer: BLUE CROSS/BLUE SHIELD | Admitting: Family Medicine

## 2018-01-29 ENCOUNTER — Other Ambulatory Visit: Payer: Self-pay

## 2018-01-29 ENCOUNTER — Encounter: Payer: Self-pay | Admitting: Family Medicine

## 2018-01-29 VITALS — BP 133/84 | HR 94 | Temp 98.4°F | Resp 16 | Ht 67.0 in | Wt 310.8 lb

## 2018-01-29 DIAGNOSIS — L700 Acne vulgaris: Secondary | ICD-10-CM | POA: Diagnosis not present

## 2018-01-29 DIAGNOSIS — F39 Unspecified mood [affective] disorder: Secondary | ICD-10-CM | POA: Diagnosis not present

## 2018-01-29 DIAGNOSIS — J22 Unspecified acute lower respiratory infection: Secondary | ICD-10-CM

## 2018-01-29 DIAGNOSIS — Z23 Encounter for immunization: Secondary | ICD-10-CM | POA: Diagnosis not present

## 2018-01-29 MED ORDER — TRETINOIN 0.05 % EX CREA: TOPICAL_CREAM | Freq: Every day | CUTANEOUS | 0 refills | Status: DC

## 2018-01-29 MED ORDER — AZITHROMYCIN 250 MG PO TABS: ORAL_TABLET | ORAL | 0 refills | Status: DC

## 2018-01-29 MED ORDER — LAMOTRIGINE 25 MG PO TABS: 25.0000 mg | ORAL_TABLET | Freq: Two times a day (BID) | ORAL | 0 refills | Status: DC

## 2018-01-29 MED ORDER — FLUTICASONE PROPIONATE 50 MCG/ACT NA SUSP: 1.0000 | Freq: Two times a day (BID) | NASAL | 6 refills | Status: DC

## 2018-01-29 NOTE — Progress Notes (Signed)
9/26/20193:27 PM  Kelli Howard 25-Sep-1990, 27 y.o. female [id number]  Chief Complaint  Patient presents with  . Cough, tylenol for symptoms    x 2 mos, yellow mucus drainage, chest hurts with coughing, coughing keeps pt up at night, no fevers present, regular appetite, bm's and voiding.   . Ear Pain    both but right hurts more and has a knot behind it x month    HPI:   Patient is a 27 y.o. female with past medical history significant for mood disorder who presents today for followup  Doing better on lamictal 25mg  once a day, seroquel 100mg  at bedtime Prn meds: seroquel 25mg  in am and trazadone 100mg  at bedtime Keeping her more leveled, less freaked out Still gets pretty anxious, but the whole going up an down much better Going into inpatient treatment, [facility], admission process done, waiting for a room, 1-2 weeks ETA, plan is for about a 3 month stay No rash  Mom is seeing less episodes of breakdown and negative thoughts  Dry cough for past 2-3 months Coughing yellowish phlegm for past several weeks Ears pain with +LN behind right ear Taking mucinex wo much benefit occ SOB, no fever or chills  Requesting increase in retina, having a bit more acne outbreaks  Fall Risk  01/29/2018 01/15/2018 12/24/2017 12/12/2017 10/23/2017  Falls in the past year? No No No No No     Depression screen [facility] 2/9 01/29/2018 01/15/2018 12/24/2017  Decreased Interest 2 3 0  Down, Depressed, Hopeless 2 3 0  PHQ - 2 Score 4 6 0  Altered sleeping 2 3 -  Tired, decreased energy 2 3 -  Change in appetite 0 3 -  Feeling bad or failure about yourself  2 3 -  Trouble concentrating 1 3 -  Moving slowly or fidgety/restless 2 0 -  Suicidal thoughts 0 2 -  PHQ-9 Score 13 23 -  Difficult doing work/chores Very difficult Extremely dIfficult -  Some encounter information is confidential and restricted. Go to Review Flowsheets activity to see all data.  Some recent data might be hidden    Allergies    Allergen Reactions  . Peanuts [Peanut Oil] Anaphylaxis    Also: tree nuts   . Other Rash    mushrooms  . Banana     Unknown reaction   . Celery Oil Itching and Rash  . Shellfish Allergy Rash    Prior to Admission medications   Medication Sig Start Date End Date Taking? Authorizing Provider  clindamycin-benzoyl peroxide (BENZACLIN) gel Apply topically 2 (two) times daily. 10/23/17  Yes Myles Lipps, MD  EPINEPHrine (EPIPEN) 0.3 mg/0.3 mL IJ SOAJ injection Inject 0.3 mLs (0.3 mg total) into the muscle as needed. Brand only 05/31/16  Yes Bing Neighbors, FNP  hydroquinone 4 % cream Apply topically 2 (two) times daily. 10/23/17  Yes Myles Lipps, MD  lamoTRIgine (LAMICTAL) 25 MG tablet Take 1 tablet (25 mg total) by mouth daily. 01/15/18  Yes Myles Lipps, MD  norgestimate-ethinyl estradiol (ORTHO-CYCLEN,SPRINTEC,PREVIFEM) 0.25-35 MG-MCG tablet Take 1 tablet by mouth daily. 10/23/17  Yes Myles Lipps, MD  omeprazole (PRILOSEC) 20 MG capsule Take 1 capsule (20 mg total) by mouth daily. 10/23/17  Yes Myles Lipps, MD  QUEtiapine (SEROQUEL) 100 MG tablet Take 1 tablet (100 mg total) by mouth at bedtime. 10/23/17  Yes Myles Lipps, MD  QUEtiapine (SEROQUEL) 25 MG tablet TAKE 1 TABLET (25 MG TOTAL) BY MOUTH DAILY  AS NEEDED. 11/21/17  Yes Myles Lipps, MD  traZODone (DESYREL) 100 MG tablet Take 2 tablets (200 mg total) by mouth at bedtime. 10/23/17  Yes Myles Lipps, MD  tretinoin (RETIN-A) 0.025 % cream APPLY TOPICALLY AT BEDTIME. 10/23/17  Yes Myles Lipps, MD  Melatonin 3 MG TABS Take 1 tablet (3 mg total) by mouth at bedtime. Patient not taking: Reported on 01/29/2018 10/23/17   Myles Lipps, MD    Past Medical History:  Diagnosis Date  . Allergy   . Anemia   . Enlarged heart   . Heart murmur   . Vitamin D deficiency     Past Surgical History:  Procedure Laterality Date  . DENTAL SURGERY    . TONSILLECTOMY      Social History   Tobacco Use  .  Smoking status: Never Smoker  . Smokeless tobacco: Never Used  Substance Use Topics  . Alcohol use: No    Family History  Problem Relation Age of Onset  . Hypertension Mother   . Thyroid disease Mother   . Hypertension Sister   . Heart disease Sister   . Anxiety disorder Brother   . Depression Brother   . ADD / ADHD Other     ROS Per hpi  OBJECTIVE:  Blood pressure 133/84, pulse 94, temperature 98.4 F (36.9 C), temperature source Oral, resp. rate 16, height 5\' 7"  (1.702 m), weight (!) 310 lb 12.8 oz (141 kg), SpO2 96 %. Body mass index is 48.68 kg/m.   Wt Readings from Last 3 Encounters:  01/29/18 (!) 310 lb 12.8 oz (141 kg)  01/15/18 (!) 313 lb (142 kg)  12/24/17 (!) 312 lb 12.8 oz (141.9 kg)    Physical Exam  Constitutional: She is oriented to person, place, and time. She appears well-developed and well-nourished.  HENT:  Head: Normocephalic and atraumatic.  Right Ear: Hearing, tympanic membrane, external ear and ear canal normal.  Left Ear: Hearing, tympanic membrane, external ear and ear canal normal.  Mouth/Throat: Oropharynx is clear and moist.  Eyes: Pupils are equal, round, and reactive to light. Conjunctivae and EOM are normal.  Neck: Neck supple.  Cardiovascular: Normal rate, regular rhythm and normal heart sounds. Exam reveals no gallop and no friction rub.  No murmur heard. Pulmonary/Chest: Effort normal and breath sounds normal. She has no wheezes. She has no rales.  Musculoskeletal: She exhibits no edema.  Lymphadenopathy:    She has no cervical adenopathy.  Neurological: She is alert and oriented to person, place, and time.  Skin: Skin is warm and dry.  Psychiatric: She has a normal mood and affect.  Nursing note and vitals reviewed.   ASSESSMENT and PLAN  1. Mood disorder (HCC) Improved on lamictal. Tolerating well, increasing to 25mg  BID, midnful of OCPs, will be going into inpatient treatment for about 3 months if things go as planned. FMLA  form changed to reflect   2. Acne vulgaris Increased retina  3. Lower respiratory tract infection Discussed most likely viral, cont with supportive measures, delayed abx use with ssx of concern provided as mother and patient are worried due to past h/o pneumonia and upcoming behavioral health inpatient admission  Other orders - lamoTRIgine (LAMICTAL) 25 MG tablet; Take 1 tablet (25 mg total) by mouth 2 (two) times daily. - fluticasone (FLONASE) 50 MCG/ACT nasal spray; Place 1 spray into both nostrils 2 (two) times daily. - azithromycin (ZITHROMAX) 250 MG tablet; Take 2 tablets (500 mg total) by mouth daily AND  1 tablet (250 mg total) daily. - tretinoin (RETIN-A) 0.05 % cream; Apply topically at bedtime. - Flu Vaccine QUAD 36+ mos IM  Return in about 3 months (around 04/30/2018). after inpatient hosp    Myles Lipps, MD Primary Care at [facility] [address] [place], [place] [address] Ph.  [phone] Fax [phone]

## 2018-01-29 NOTE — Patient Instructions (Addendum)
     If you have lab work done today you will be contacted with your lab results within the next 2 weeks.  If you have not heard from us then please contact us. The fastest way to get your results is to register for My Chart.   IF you received an x-ray today, you will receive an invoice from [facility]. Please contact [facility] at [phone] with questions or concerns regarding your invoice.   IF you received labwork today, you will receive an invoice from [facility]. Please contact [facility] at [phone] with questions or concerns regarding your invoice.   Our billing staff will not be able to assist you with questions regarding bills from these companies.  You will be contacted with the lab results as soon as they are available. The fastest way to get your results is to activate your My Chart account. Instructions are located on the last page of this paperwork. If you have not heard from us regarding the results in 2 weeks, please contact this office.     Acute Bronchitis, Adult Acute bronchitis is when air tubes (bronchi) in the lungs suddenly get swollen. The condition can make it hard to breathe. It can also cause these symptoms:  A cough.  Coughing up clear, yellow, or green mucus.  Wheezing.  Chest congestion.  Shortness of breath.  A fever.  Body aches.  Chills.  A sore throat.  Follow these instructions at home: Medicines  Take over-the-counter and prescription medicines only as told by your doctor.  If you were prescribed an antibiotic medicine, take it as told by your doctor. Do not stop taking the antibiotic even if you start to feel better. General instructions  Rest.  Drink enough fluids to keep your pee (urine) clear or pale yellow.  Avoid smoking and secondhand smoke. If you smoke and you need help quitting, ask your doctor. Quitting will help your lungs heal faster.  Use an inhaler, cool mist vaporizer, or humidifier as told  by your doctor.  Keep all follow-up visits as told by your doctor. This is important. How is this prevented? To lower your risk of getting this condition again:  Wash your hands often with soap and water. If you cannot use soap and water, use hand sanitizer.  Avoid contact with people who have cold symptoms.  Try not to touch your hands to your mouth, nose, or eyes.  Make sure to get the flu shot every year.  Contact a doctor if:  Your symptoms do not get better in 2 weeks. Get help right away if:  You cough up blood.  You have chest pain.  You have very bad shortness of breath.  You become dehydrated.  You faint (pass out) or keep feeling like you are going to pass out.  You keep throwing up (vomiting).  You have a very bad headache.  Your fever or chills gets worse. This information is not intended to replace advice given to you by your health care provider. Make sure you discuss any questions you have with your health care provider. Document Released: 10/09/2007 Document Revised: 11/29/2015 Document Reviewed: 10/11/2015 Elsevier Interactive Patient Education  2018 [employer]  

## 2018-02-06 ENCOUNTER — Other Ambulatory Visit: Payer: Self-pay | Admitting: Family Medicine

## 2018-02-06 DIAGNOSIS — R69 Illness, unspecified: Secondary | ICD-10-CM | POA: Diagnosis not present

## 2018-02-06 DIAGNOSIS — F329 Major depressive disorder, single episode, unspecified: Secondary | ICD-10-CM | POA: Diagnosis not present

## 2018-02-06 NOTE — Telephone Encounter (Signed)
Requested medication (s) are due for refill today: yes  Requested medication (s) are on the active medication list: yes  Last refill: 01/15/18  Future visit scheduled: yes  Notes to clinic:  undelegated    Requested Prescriptions  Pending Prescriptions Disp Refills   lamoTRIgine (LAMICTAL) 25 MG tablet [Pharmacy Med Name: LAMOTRIGINE 25 MG TABLET] 30 tablet 0    Sig: TAKE 1 TABLET BY MOUTH EVERY DAY     Not Delegated - Neurology:  Anticonvulsants Failed - 02/06/2018 10:33 AM      Failed - This refill cannot be delegated      Passed - HCT in normal range and within 360 days    Hematocrit  Date Value Ref Range Status  02/25/2017 39.9 34.0 - 46.6 % Final         Passed - HGB in normal range and within 360 days    Hemoglobin  Date Value Ref Range Status  02/25/2017 12.8 11.1 - 15.9 g/dL Final         Passed - PLT in normal range and within 360 days    Platelets  Date Value Ref Range Status  02/25/2017 311 150 - 379 x10E3/uL Final   Platelet Count, POC  Date Value Ref Range Status  01/03/2016 278 142 - 424 K/uL Final         Passed - WBC in normal range and within 360 days    WBC  Date Value Ref Range Status  02/25/2017 7.6 3.4 - 10.8 x10E3/uL Final  01/03/2016 9.6 4.6 - 10.2 K/uL Final         Passed - Valid encounter within last 12 months    Recent Outpatient Visits          1 week ago Mood disorder [facility])   [facility], Meda Coffee, MD   3 weeks ago Mood disorder [facility])   [facility], Meda Coffee, MD   1 month ago Mood disorder [facility])   [facility], Meda Coffee, MD   1 month ago Migraine without aura and with status migrainosus, not intractable   [facility] at [facility], Manus Rudd, MD   3 months ago Right ear pain   [facility], Meda Coffee, MD      Future Appointments            In 2 months Myles Lipps, MD Primary [facility] at [place], [facility]

## 2018-02-07 ENCOUNTER — Other Ambulatory Visit: Payer: Self-pay

## 2018-02-07 ENCOUNTER — Encounter: Payer: Self-pay | Admitting: Family Medicine

## 2018-02-07 ENCOUNTER — Ambulatory Visit (INDEPENDENT_AMBULATORY_CARE_PROVIDER_SITE_OTHER): Payer: BLUE CROSS/BLUE SHIELD | Admitting: Family Medicine

## 2018-02-07 VITALS — BP 129/75 | HR 95 | Temp 99.1°F | Resp 20 | Ht 67.32 in | Wt 308.4 lb

## 2018-02-07 DIAGNOSIS — F39 Unspecified mood [affective] disorder: Secondary | ICD-10-CM | POA: Diagnosis not present

## 2018-02-07 NOTE — Progress Notes (Signed)
10/5/20199:45 AM  Kelli Howard 14-Aug-1990, 27 y.o. female [id number]  Chief Complaint  Patient presents with  . Depression    screening done  . Anxiety    screening done  . Talk about Weight    HPI:   Patient is a 27 y.o. female with past medical history significant for mood disorder who presents today for followup  Last visit 01/29/18 Increased lamictal from 25mg  day to 25mg  BID Cont seroquel 100mg  at bedtime Cont seroquel 25mg  and trazadone 100mg  prn  Should have a room at [facility] beginning of next week Tolerating increase in lamictal well, no rash or other side effects Mom reports less irritability in the evenings She will be able to continue with her exercise regime while inpatient as they do have a gym on site.  Meals provided by facility, overall healthy  Has been walking and going to the gym regularly x 4-6 months She is also monitoring what she is eating, counting calories Did [employer]ystem with a personal trainer x 1 year last year  Weight gain with several meds used for mood: sertraline, lexapro, remeron, gabapentin Mom has gone down from size 16 to 12 doing same diet/exercise Having some labored breathing, no wheezing, no coughing Walks about a mile and starts having symptoms, has not been able to increase distance nor speed Denies h/o asthma, has been tested for asthma  Has been seeing a bariatric surgeon She wants to live a more full life, she worries about fitting in seats or being above weight limits, such as roller coasters, water parks, horseback riding Has never tried medications: phenrtramine and wellbutrin not appropriate due to mood issues, saxenda not covered by insurance normal A1c and Lipids in June 2019 Requesting letter for bariatric surgeon  Lab Results  Component Value Date   HGBA1C 5.5 10/23/2017   Lab Results  Component Value Date   LDLCALC 85 10/23/2017   CREATININE 0.85 10/23/2017    Fall Risk  02/07/2018 01/29/2018 01/15/2018  12/24/2017 12/12/2017  Falls in the past year? No No No No No     Depression screen [facility] 2/9 02/07/2018 01/29/2018 01/15/2018  Decreased Interest 2 2 3   Down, Depressed, Hopeless 2 2 3   PHQ - 2 Score 4 4 6   Altered sleeping 2 2 3   Tired, decreased energy 2 2 3   Change in appetite 2 0 3  Feeling bad or failure about yourself  2 2 3   Trouble concentrating 2 1 3   Moving slowly or fidgety/restless 1 2 0  Suicidal thoughts 0 0 2  PHQ-9 Score 15 13 23   Difficult doing work/chores Very difficult Very difficult Extremely dIfficult  Some encounter information is confidential and restricted. Go to Review Flowsheets activity to see all data.  Some recent data might be hidden    Allergies  Allergen Reactions  . Peanuts [Peanut Oil] Anaphylaxis    Also: tree nuts   . Other Rash    mushrooms  . Banana     Unknown reaction   . Celery Oil Itching and Rash  . Shellfish Allergy Rash    Prior to Admission medications   Medication Sig Start Date End Date Taking? Authorizing Provider  azithromycin (ZITHROMAX) 250 MG tablet Take 2 tablets (500 mg total) by mouth daily AND 1 tablet (250 mg total) daily. 01/29/18  Yes Myles Lipps, MD  clindamycin-benzoyl peroxide [facility]) gel Apply topically 2 (two) times daily. 10/23/17  Yes Myles Lipps, MD  EPINEPHrine (EPIPEN) 0.3 mg/0.3 mL IJ  SOAJ injection Inject 0.3 mLs (0.3 mg total) into the muscle as needed. Brand only 05/31/16  Yes Bing Neighbors, FNP  fluticasone (FLONASE) 50 MCG/ACT nasal spray Place 1 spray into both nostrils 2 (two) times daily. 01/29/18  Yes Myles Lipps, MD  hydroquinone 4 % cream Apply topically 2 (two) times daily. 10/23/17  Yes Myles Lipps, MD  lamoTRIgine (LAMICTAL) 25 MG tablet Take 1 tablet (25 mg total) by mouth 2 (two) times daily. 01/29/18  Yes Myles Lipps, MD  norgestimate-ethinyl estradiol (ORTHO-CYCLEN,SPRINTEC,PREVIFEM) 0.25-35 MG-MCG tablet Take 1 tablet by mouth daily. 10/23/17  Yes Myles Lipps,  MD  omeprazole (PRILOSEC) 20 MG capsule Take 1 capsule (20 mg total) by mouth daily. 10/23/17  Yes Myles Lipps, MD  QUEtiapine (SEROQUEL) 100 MG tablet Take 1 tablet (100 mg total) by mouth at bedtime. 10/23/17  Yes Myles Lipps, MD  QUEtiapine (SEROQUEL) 25 MG tablet TAKE 1 TABLET (25 MG TOTAL) BY MOUTH DAILY AS NEEDED. 11/21/17  Yes Myles Lipps, MD  traZODone (DESYREL) 100 MG tablet Take 2 tablets (200 mg total) by mouth at bedtime. 10/23/17  Yes Myles Lipps, MD  tretinoin (RETIN-A) 0.05 % cream Apply topically at bedtime. 01/29/18  Yes Myles Lipps, MD    Past Medical History:  Diagnosis Date  . Allergy   . Anemia   . Enlarged heart   . Heart murmur   . Vitamin D deficiency     Past Surgical History:  Procedure Laterality Date  . DENTAL SURGERY    . TONSILLECTOMY      Social History   Tobacco Use  . Smoking status: Never Smoker  . Smokeless tobacco: Never Used  Substance Use Topics  . Alcohol use: Yes    Comment: occ    Family History  Problem Relation Age of Onset  . Hypertension Mother   . Thyroid disease Mother   . Hypertension Sister   . Heart disease Sister   . Anxiety disorder Brother   . Depression Brother   . ADD / ADHD Other     ROS Per hpi  OBJECTIVE:  Blood pressure 129/75, pulse 95, temperature 99.1 F (37.3 C), temperature source Oral, resp. rate 20, height 5' 7.32" (1.71 m), weight (!) 308 lb 6.4 oz (139.9 kg), SpO2 96 %. Body mass index is 47.84 kg/m.   Wt Readings from Last 3 Encounters:  02/07/18 (!) 308 lb 6.4 oz (139.9 kg)  01/29/18 (!) 310 lb 12.8 oz (141 kg)  01/15/18 (!) 313 lb (142 kg)    Physical Exam  Constitutional: She is oriented to person, place, and time. She appears well-developed and well-nourished.  HENT:  Head: Normocephalic and atraumatic.  Mouth/Throat: Mucous membranes are normal.  Eyes: Pupils are equal, round, and reactive to light. Conjunctivae and EOM are normal. No scleral icterus.  Neck:  Neck supple.  Pulmonary/Chest: Effort normal.  Neurological: She is alert and oriented to person, place, and time.  Skin: Skin is warm and dry.  Psychiatric: She has a normal mood and affect.  Nursing note and vitals reviewed.   ASSESSMENT and PLAN  1. Mood disorder (HCC) Much improved, with significantly less mood swings and irritability since started on Lamictal. Cont current meds. Due for lamictal increase next week, anticipate being in [facility] inpatient at that time, will defer to them for further treatment.   2. Severe obesity (BMI >= 40) (HCC) Morbid obesity wo medical comorbidities unless she were to have undiagnosed  sleep apnea. Sleep study will be coordinated by bariatric surgery program. However it has had significant mental health impact, leading to isolation and avoidance of age appropriate family and friend gatherings due to concerns around her ability to participate in activities.    Patient has lost 5 lb since stopping gabapentin and remeron in September.  She however she has had a steady raise in weight since 2015 despite serious attempts at LFM during the last 18 months. I would not advise phentramine nor wellbutrin due to mood issues. Her insurance does not cover saxenda and patient unsure about injectable medication.  Bariatric surgery at this point is a medically appropriate treatment for weight loss. Patient has demonstrated continuous efforts at diet and exercise with minimal results. Her emotional and psychological health have suffered tremendously due to her weight. Paperwork to be completed.   Return in about 4 weeks (around 03/07/2018) for weight.    Myles Lipps, MD Primary Care at [facility] [address] [place], [place] [address] Ph.  [phone] Fax [phone]

## 2018-02-07 NOTE — Patient Instructions (Signed)
° ° ° °  If you have lab work done today you will be contacted with your lab results within the next 2 weeks.  If you have not heard from us then please contact us. The fastest way to get your results is to register for My Chart. ° ° °IF you received an x-ray today, you will receive an invoice from [facility]. Please contact [facility] at [phone] with questions or concerns regarding your invoice.  ° °IF you received labwork today, you will receive an invoice from [facility]. Please contact [facility] at [phone] with questions or concerns regarding your invoice.  ° °Our billing staff will not be able to assist you with questions regarding bills from these companies. ° °You will be contacted with the lab results as soon as they are available. The fastest way to get your results is to activate your My Chart account. Instructions are located on the last page of this paperwork. If you have not heard from us regarding the results in 2 weeks, please contact this office. °  ° ° ° °

## 2018-02-09 ENCOUNTER — Telehealth: Payer: Self-pay | Admitting: *Deleted

## 2018-02-09 NOTE — Telephone Encounter (Signed)
Faxed successful [place] surgery

## 2018-02-10 ENCOUNTER — Other Ambulatory Visit: Payer: Self-pay | Admitting: Surgery

## 2018-02-11 DIAGNOSIS — Z0271 Encounter for disability determination: Secondary | ICD-10-CM

## 2018-02-12 ENCOUNTER — Ambulatory Visit
Admission: RE | Admit: 2018-02-12 | Discharge: 2018-02-12 | Disposition: A | Discharge status: Home / Self Care | Payer: BLUE CROSS/BLUE SHIELD | Source: Ambulatory Visit | Attending: Surgery | Admitting: Surgery

## 2018-02-12 ENCOUNTER — Ambulatory Visit: Payer: Self-pay | Admitting: Dietician

## 2018-02-12 DIAGNOSIS — Z01818 Encounter for other preprocedural examination: Secondary | ICD-10-CM | POA: Insufficient documentation

## 2018-02-12 DIAGNOSIS — Z01810 Encounter for preprocedural cardiovascular examination: Secondary | ICD-10-CM | POA: Diagnosis not present

## 2018-02-12 DIAGNOSIS — J9811 Atelectasis: Secondary | ICD-10-CM | POA: Diagnosis not present

## 2018-02-12 DIAGNOSIS — K219 Gastro-esophageal reflux disease without esophagitis: Secondary | ICD-10-CM | POA: Insufficient documentation

## 2018-02-12 DIAGNOSIS — E669 Obesity, unspecified: Secondary | ICD-10-CM | POA: Diagnosis not present

## 2018-02-13 ENCOUNTER — Encounter: Payer: Self-pay | Admitting: Dietician

## 2018-02-13 ENCOUNTER — Encounter: Payer: BLUE CROSS/BLUE SHIELD | Attending: Surgery | Admitting: Dietician

## 2018-02-13 DIAGNOSIS — Z713 Dietary counseling and surveillance: Secondary | ICD-10-CM | POA: Insufficient documentation

## 2018-02-13 DIAGNOSIS — Z6841 Body Mass Index (BMI) 40.0 and over, adult: Secondary | ICD-10-CM | POA: Diagnosis not present

## 2018-02-13 DIAGNOSIS — F509 Eating disorder, unspecified: Secondary | ICD-10-CM | POA: Diagnosis not present

## 2018-02-13 DIAGNOSIS — E669 Obesity, unspecified: Secondary | ICD-10-CM | POA: Insufficient documentation

## 2018-02-13 NOTE — Progress Notes (Signed)
Pre-Op Assessment Visit:  Pre-Operative Sleeve Surgery  Medical Nutrition Therapy  Appt Start Time: 8:05am  End time:  9:05am  Patient was seen on 02/13/18 for Pre-Operative Nutrition Assessment. Assessment and letter of approval faxed to [facility] Bariatric Surgery Program coordinator on 02/13/18.   Pt expectation of surgery: Pt states she wants to lose weight (to reach 160 lb.)   Pt expectation of Dietitian: Eating habits, ideas for healthy quick meals, and a diet that is realistic to maintain.    Start weight at [facility]: 310.1 lb  Height: 68" BMI: 47.2   24 hr Dietary Recall  First Meal: [place] bacon, oatmeal Snack: none  Second Meal: pasta, alfredo sauce, chicken  Snack: fruit Third Meal: none Snack: fruit  Beverages: water, flavor packet, juice, hot tea with honey   Physical Activity  Current average weekly physical activity: walking every day, playing with dogs  Encouraged to engage in 150 minutes of moderate physical activity including cardiovascular and weight baring weekly.   Estimated Energy and Macronutrient Recommendations Calories: 1800 Carbohydrate: 200 Protein: 135 Fat: 50   During the appointment today the following Pre-Op Goals were reviewed with the patient: . Track you food and beverages using MyFitness Pal or [employer]it[employer] app . Make healthy food choices . Avoid/limit concentrated sugars and fried foods . Keep fat/sugar in the single digits per serving on food labels . Practice CHEWING your food  (aim for 30 chews per bite or until applesauce consistency) . Practice not drinking 15 minutes before, during, and 30 minutes after each meal/snack . Avoid all carbonated beverages (ex: soda, sparkling beverages)  . Limit caffeinated beverages (ex: coffee, tea, energy drinks) . Avoid all sugar-sweetened beverages  . Avoid alcohol  . Consume 3 meals per day; eat every 3-5 hours . Make a list of non-food related activities . Monitor and aim for  64-100 ounces of fluid daily  . Aim for at least 60-80 grams of protein daily . Look for a liquid protein source that contains ?15 g protein and ?5 g carbohydrate (ex: shakes, drinks, shots) . Physical activity is an important part of a healthy lifestyle so keep it moving!  Handouts given during visit include:  . Pre-Op Goals . Bariatric Surgery Vitamins & Minerals . Bariatric Surgery Protein Shakes  Teaching method utilized: Visual & Auditory  Demonstrated degree of understanding via: Teach Back  Barriers to learning/adherence to lifestyle change: none identified  Patient is to call the [facility] Education Services to enroll in Pre-Op (class >2 weeks before surgery) and Post-Op (2 weeks after surgery) Nutrition Education when surgery date is scheduled.

## 2018-02-17 DIAGNOSIS — F509 Eating disorder, unspecified: Secondary | ICD-10-CM | POA: Diagnosis not present

## 2018-02-18 ENCOUNTER — Emergency Department
Admission: EM | Admit: 2018-02-18 | Discharge: 2018-02-18 | Disposition: A | Discharge status: Home / Self Care | Payer: BLUE CROSS/BLUE SHIELD | Attending: Emergency Medicine | Admitting: Emergency Medicine

## 2018-02-18 ENCOUNTER — Other Ambulatory Visit: Payer: Self-pay

## 2018-02-18 ENCOUNTER — Encounter: Payer: Self-pay | Admitting: *Deleted

## 2018-02-18 DIAGNOSIS — Y999 Unspecified external cause status: Secondary | ICD-10-CM | POA: Diagnosis not present

## 2018-02-18 DIAGNOSIS — Y9389 Activity, other specified: Secondary | ICD-10-CM | POA: Insufficient documentation

## 2018-02-18 DIAGNOSIS — Z79899 Other long term (current) drug therapy: Secondary | ICD-10-CM | POA: Diagnosis not present

## 2018-02-18 DIAGNOSIS — S161XXA Strain of muscle, fascia and tendon at neck level, initial encounter: Secondary | ICD-10-CM | POA: Diagnosis not present

## 2018-02-18 DIAGNOSIS — L811 Chloasma: Secondary | ICD-10-CM | POA: Diagnosis not present

## 2018-02-18 DIAGNOSIS — L74511 Primary focal hyperhidrosis, face: Secondary | ICD-10-CM | POA: Diagnosis not present

## 2018-02-18 DIAGNOSIS — S199XXA Unspecified injury of neck, initial encounter: Secondary | ICD-10-CM | POA: Diagnosis not present

## 2018-02-18 DIAGNOSIS — Y92410 Unspecified street and highway as the place of occurrence of the external cause: Secondary | ICD-10-CM | POA: Diagnosis not present

## 2018-02-18 DIAGNOSIS — S46812A Strain of other muscles, fascia and tendons at shoulder and upper arm level, left arm, initial encounter: Secondary | ICD-10-CM | POA: Diagnosis not present

## 2018-02-18 DIAGNOSIS — D225 Melanocytic nevi of trunk: Secondary | ICD-10-CM | POA: Diagnosis not present

## 2018-02-18 DIAGNOSIS — Z91010 Allergy to peanuts: Secondary | ICD-10-CM | POA: Diagnosis not present

## 2018-02-18 MED ORDER — IBUPROFEN 600 MG PO TABS
600.0000 mg | ORAL_TABLET | Freq: Once | ORAL | Status: DC
  Filled 2018-02-18: qty 1

## 2018-02-18 MED ORDER — CYCLOBENZAPRINE HCL 10 MG PO TABS: 10.0000 mg | ORAL_TABLET | Freq: Three times a day (TID) | ORAL | 0 refills | Status: DC | PRN

## 2018-02-18 MED ORDER — IBUPROFEN 600 MG PO TABS: 600.0000 mg | ORAL_TABLET | Freq: Four times a day (QID) | ORAL | 0 refills | Status: DC | PRN

## 2018-02-18 MED ORDER — CYCLOBENZAPRINE HCL 10 MG PO TABS
10.0000 mg | ORAL_TABLET | Freq: Once | ORAL | Status: DC
  Filled 2018-02-18: qty 1

## 2018-02-18 MED ORDER — ACETAMINOPHEN 500 MG PO TABS
1000.0000 mg | ORAL_TABLET | Freq: Once | ORAL | Status: DC
  Filled 2018-02-18: qty 2
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
## 2018-02-18 NOTE — ED Notes (Signed)
Pt. Refused to take cyclobenzaprine and ibuprofen.  Tylenol requested instead, provider notified.
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
## 2018-02-18 NOTE — Discharge Instructions (Signed)
Please follow up with your primary care provider for symptoms that are not improving over the week.  Return to the ER for any symptom of concern if unable to see your PCP.

## 2018-02-19 ENCOUNTER — Telehealth: Payer: Self-pay

## 2018-02-19 NOTE — Telephone Encounter (Signed)
Pt's father dropped off add'l disability paperwork.  States company now requesting same info on a different form.  Made copies to provide back to pt's father, placed original in CMA box.

## 2018-02-20 ENCOUNTER — Other Ambulatory Visit: Payer: Self-pay | Admitting: Family Medicine

## 2018-02-20 DIAGNOSIS — F509 Eating disorder, unspecified: Secondary | ICD-10-CM | POA: Diagnosis not present

## 2018-02-20 NOTE — Telephone Encounter (Signed)
Requested medication (s) are due for refill today: yes  Requested medication (s) are on the active medication list:yes  Last refill: 01/29/18  #60  Nani Gasser  Future visit scheduled: yes 05/05/18      Requested Prescriptions  Pending Prescriptions Disp Refills   lamoTRIgine (LAMICTAL) 25 MG tablet [Pharmacy Med Name: LAMOTRIGINE 25 MG TABLET] 60 tablet 0    Sig: TAKE 1 TABLET BY MOUTH TWICE A DAY     Not Delegated - Neurology:  Anticonvulsants Failed - 02/20/2018 10:36 AM      Failed - This refill cannot be delegated      Passed - HCT in normal range and within 360 days    Hematocrit  Date Value Ref Range Status  02/25/2017 39.9 34.0 - 46.6 % Final         Passed - HGB in normal range and within 360 days    Hemoglobin  Date Value Ref Range Status  02/25/2017 12.8 11.1 - 15.9 g/dL Final         Passed - PLT in normal range and within 360 days    Platelets  Date Value Ref Range Status  02/25/2017 311 150 - 379 x10E3/uL Final   Platelet Count, POC  Date Value Ref Range Status  01/03/2016 278 142 - 424 K/uL Final         Passed - WBC in normal range and within 360 days    WBC  Date Value Ref Range Status  02/25/2017 7.6 3.4 - 10.8 x10E3/uL Final  01/03/2016 9.6 4.6 - 10.2 K/uL Final         Passed - Valid encounter within last 12 months    Recent Outpatient Visits          1 week ago Mood disorder [facility])   [facility], Meda Coffee, MD   3 weeks ago Mood disorder [facility])   [facility], Meda Coffee, MD   1 month ago Mood disorder [facility])   [facility], Meda Coffee, MD   1 month ago Mood disorder [facility])   [facility], Meda Coffee, MD   2 months ago Migraine without aura and with status migrainosus, not intractable   Primary Care at [facility], Manus Rudd, MD      Future Appointments            In 2 months Myles Lipps, MD [facility] [place], [facility]

## 2018-02-23 ENCOUNTER — Ambulatory Visit: Payer: BLUE CROSS/BLUE SHIELD | Admitting: Family Medicine

## 2018-02-24 ENCOUNTER — Emergency Department: Payer: BLUE CROSS/BLUE SHIELD

## 2018-02-24 ENCOUNTER — Other Ambulatory Visit: Payer: Self-pay

## 2018-02-24 ENCOUNTER — Emergency Department
Admission: EM | Admit: 2018-02-24 | Discharge: 2018-02-24 | Disposition: A | Discharge status: Home / Self Care | Payer: BLUE CROSS/BLUE SHIELD | Attending: Emergency Medicine | Admitting: Emergency Medicine

## 2018-02-24 DIAGNOSIS — M25512 Pain in left shoulder: Secondary | ICD-10-CM | POA: Insufficient documentation

## 2018-02-24 DIAGNOSIS — S161XXA Strain of muscle, fascia and tendon at neck level, initial encounter: Secondary | ICD-10-CM | POA: Insufficient documentation

## 2018-02-24 DIAGNOSIS — S0990XA Unspecified injury of head, initial encounter: Secondary | ICD-10-CM | POA: Diagnosis not present

## 2018-02-24 DIAGNOSIS — Z79899 Other long term (current) drug therapy: Secondary | ICD-10-CM | POA: Diagnosis not present

## 2018-02-24 DIAGNOSIS — Y9389 Activity, other specified: Secondary | ICD-10-CM | POA: Diagnosis not present

## 2018-02-24 DIAGNOSIS — Y999 Unspecified external cause status: Secondary | ICD-10-CM | POA: Diagnosis not present

## 2018-02-24 DIAGNOSIS — Y92410 Unspecified street and highway as the place of occurrence of the external cause: Secondary | ICD-10-CM | POA: Diagnosis not present

## 2018-02-24 DIAGNOSIS — F509 Eating disorder, unspecified: Secondary | ICD-10-CM | POA: Diagnosis not present

## 2018-02-24 DIAGNOSIS — R51 Headache: Secondary | ICD-10-CM | POA: Diagnosis not present

## 2018-02-24 DIAGNOSIS — S4992XA Unspecified injury of left shoulder and upper arm, initial encounter: Secondary | ICD-10-CM | POA: Diagnosis not present

## 2018-02-24 DIAGNOSIS — M542 Cervicalgia: Secondary | ICD-10-CM | POA: Diagnosis not present

## 2018-02-24 DIAGNOSIS — S199XXA Unspecified injury of neck, initial encounter: Secondary | ICD-10-CM | POA: Diagnosis not present

## 2018-02-24 MED ORDER — NAPROXEN 500 MG PO TABS: 500.0000 mg | ORAL_TABLET | Freq: Two times a day (BID) | ORAL | 0 refills | Status: DC

## 2018-02-24 NOTE — Discharge Instructions (Signed)
Call make an appointment with your doctor for follow-up.  Begin taking naproxen 500 mg twice daily with food for muscle pain.  You may also alternate ice packs and moist heat to your neck and shoulder area for comfort.  Continue with your regular medication.
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
## 2018-03-03 ENCOUNTER — Ambulatory Visit: Payer: Self-pay | Admitting: Internal Medicine

## 2018-03-03 ENCOUNTER — Telehealth: Payer: Self-pay | Admitting: Family Medicine

## 2018-03-03 NOTE — Telephone Encounter (Signed)
Pt message for Drysol sent to Dr. Leretha Pol Not on current nor historical meds list

## 2018-03-03 NOTE — Telephone Encounter (Signed)
Copied from CRM [id number]. Topic: Quick Communication - Rx Refill/Question >> Mar 03, 2018  2:52 PM Arlyss Gandy, NT wrote: Medication: Drysol  Has the patient contacted their pharmacy? Yes.   (Agent: If no, request that the patient contact the pharmacy for the refill.) (Agent: If yes, when and what did the pharmacy advise?)  Preferred Pharmacy (with phone number or street name): [facility] [phone] Judithann Sheen, [place] - [address] [phone] (Phone) [phone] (Fax)    Agent: Please be advised that RX refills may take up to 3 business days. We ask that you follow-up with your pharmacy.

## 2018-03-04 MED ORDER — ALUMINUM CHLORIDE 20 % EX SOLN: Freq: Every day | CUTANEOUS | 3 refills | Status: DC

## 2018-03-05 ENCOUNTER — Ambulatory Visit (INDEPENDENT_AMBULATORY_CARE_PROVIDER_SITE_OTHER): Payer: BLUE CROSS/BLUE SHIELD | Admitting: Internal Medicine

## 2018-03-05 ENCOUNTER — Encounter: Payer: Self-pay | Admitting: Internal Medicine

## 2018-03-05 DIAGNOSIS — J9811 Atelectasis: Secondary | ICD-10-CM | POA: Diagnosis not present

## 2018-03-05 NOTE — Patient Instructions (Signed)
Atelectasis, Adult Atelectasis is a collapse of air sacs in the lungs (alveoli). The condition causes all or part of a lung to collapse. Atelectasis is a common problem after surgery. Its severity depends on the size of lung tissue area involved and the underlying cause. When severe, it can lead to shortness of breath and heart problems. Atelectasis can develop suddenly or over a long period of time. Atelectasis that develops over a long period of time (chronic atelectasis) often leads to infection, scarring, and other problems. What are the causes? This condition may be caused by:  Shallow breathing.  Medicines that make breathing more shallow.  A blockage in an airway. Blockages can result from: ? A buildup of mucus. ? A tumor. ? An inhaled object (foreign body). ? Enlarged lymph nodes. ? Fluid in the lungs (pleural effusion). ? A blood clot in the lungs.  Outside pressure on the lung. Pressure can be due to: ? A tumor. ? Fluid in the lungs (pleural effusion). ? Air leaking between the lung and rib cage (pneumothorax). ? Enlarged lymph nodes.  Improper expansion of the lungs. This may occur in newborns because of: ? Prematurity. ? Low oxygen levels. ? Secretions at birth that block the airway. ? Amniotic fluid that goes into the lungs (aspiration).  What increases the risk? This condition is more likely to develop in people who:  Have an injury or health problem that makes taking deep breaths difficult or painful.  Have certain infections or diseases, such as pneumonia or cystic fibrosis.  Have had surgery on the chest or abdomen.  Have broken ribs.  Have a tight bandage around their chest.  Have a collapsed lung due to pneumothorax.  Take medicines that decrease the rate of their breathing or how deeply they breathe, like sedatives.  Lie flat for long periods of time.  What are the signs or symptoms? Often, there are no symptoms for this condition. When symptoms  do appear, they may include:  Shortness of breath.  Bluish color to the nails, lips, or mouth (cyanosis).  A cough.  How is this diagnosed? This condition may be diagnosed based on:  Symptoms.  A physical exam.  A chest X-ray.  Sometimes specialized imaging tests are needed to diagnose the condition. How is this treated? Treatment for this condition depends on what caused the condition. Treatment may involve:  Coughing. Coughing helps loosen mucus in the airway.  Chest physiotherapy. This is a treatment to help loosen and clear mucus from the airways. It is done by clapping the chest.  Postural drainage techniques. This treatment involves positioning your body so your head is lower than your chest. It helps mucus drain from your airways.  An incentive spirometer. This is a device that is used to help with taking deeper breaths.  Positive pressure breathing. This is a form of breathing assistance in which air is forced into the lungs when you breathe in (inhale). You may have this treatment if your condition is severe.  Treatment of the underlying condition.  Follow these instructions at home:  Take over-the-counter and prescription medicines only as told by your health care provider.  Practice taking relaxed and deep breaths when you are sitting. A good time to practice is when you are watching TV. Take a few deep breaths during each commercial break.  Make sure to lie on your unaffected side when you are lying down. For example, if you have atelectasis in your left lung, lie on your right  side. This will help mucus drain from your airway.  Cough several times a day as told by your health care provider.  Perform chest physiotherapy or postural drainage techniques as told by your health care provider. If necessary, have someone help you.  If you were given a device to help with breathing, use it as told by your health care provider.  Stay as active as possible. Get help  right away if:  Your breathing problems get worse.  You have severe chest pain.  You develop severe coughing.  You cough up blood.  You have a fever.  You have persistent symptoms for more than 2-3 days.  Your symptoms suddenly get worse. This information is not intended to replace advice given to you by your health care provider. Make sure you discuss any questions you have with your health care provider. Document Released: 04/22/2005 Document Revised: 11/10/2015 Document Reviewed: 09/25/2015 [employer] Interactive Patient Education  2018 [employer]  
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
## 2018-03-06 ENCOUNTER — Ambulatory Visit: Admission: RE | Admit: 2018-03-06 | Payer: No Typology Code available for payment source | Source: Ambulatory Visit

## 2018-03-13 DIAGNOSIS — Z136 Encounter for screening for cardiovascular disorders: Secondary | ICD-10-CM | POA: Diagnosis not present

## 2018-03-13 DIAGNOSIS — Z111 Encounter for screening for respiratory tuberculosis: Secondary | ICD-10-CM | POA: Diagnosis not present

## 2018-03-13 DIAGNOSIS — Z3202 Encounter for pregnancy test, result negative: Secondary | ICD-10-CM | POA: Diagnosis not present

## 2018-03-13 DIAGNOSIS — Z0289 Encounter for other administrative examinations: Secondary | ICD-10-CM | POA: Diagnosis not present

## 2018-03-23 ENCOUNTER — Ambulatory Visit: Payer: Self-pay | Admitting: Internal Medicine

## 2018-03-25 DIAGNOSIS — Z0000 Encounter for general adult medical examination without abnormal findings: Secondary | ICD-10-CM | POA: Diagnosis not present

## 2018-03-27 ENCOUNTER — Other Ambulatory Visit: Payer: Self-pay | Admitting: Family Medicine

## 2018-03-27 NOTE — Telephone Encounter (Signed)
Requested medication (s) are due for refill today: no  Requested medication (s) are on the active medication list: yes  Last refill:  02/23/18  Future visit scheduled: yes  Notes to clinic     Requested Prescriptions  Pending Prescriptions Disp Refills   lamoTRIgine (LAMICTAL) 25 MG tablet [Pharmacy Med Name: LAMOTRIGINE 25 MG TABLET] 60 tablet 0    Sig: TAKE 1 TABLET BY MOUTH TWICE A DAY     Not Delegated - Neurology:  Anticonvulsants Failed - 03/27/2018 10:31 AM      Failed - This refill cannot be delegated      Failed - HCT in normal range and within 360 days    Hematocrit  Date Value Ref Range Status  02/25/2017 39.9 34.0 - 46.6 % Final         Failed - HGB in normal range and within 360 days    Hemoglobin  Date Value Ref Range Status  02/25/2017 12.8 11.1 - 15.9 g/dL Final         Failed - PLT in normal range and within 360 days    Platelets  Date Value Ref Range Status  02/25/2017 311 150 - 379 x10E3/uL Final   Platelet Count, POC  Date Value Ref Range Status  01/03/2016 278 142 - 424 K/uL Final         Failed - WBC in normal range and within 360 days    WBC  Date Value Ref Range Status  02/25/2017 7.6 3.4 - 10.8 x10E3/uL Final  01/03/2016 9.6 4.6 - 10.2 K/uL Final         Passed - Valid encounter within last 12 months    Recent Outpatient Visits          1 month ago Mood disorder [facility](HCC)   [facility] [place] [facility], Meda CoffeeIrma M, MD   1 month ago Mood disorder [facility](HCC)   [facility], Meda CoffeeIrma M, MD   2 months ago Mood disorder [facility](HCC)   [facility] [place], Meda CoffeeIrma M, MD   3 months ago Mood disorder [facility](HCC)   [facility] [place], Meda CoffeeIrma M, MD   3 months ago Migraine without aura and with status migrainosus, not intractable   [facility], Manus RuddZoe A, MD      Future Appointments            In 1 month Leretha PolSantiago, Meda CoffeeIrma M, MD [facility] [place], [facility]

## 2018-03-30 ENCOUNTER — Telehealth: Payer: Self-pay | Admitting: Family Medicine

## 2018-03-30 NOTE — Telephone Encounter (Signed)
pts father dropped off dis form for daughter,states he is not charged for form and that Dr Leretha PolSantiago completes in same day    Best phone for pt is [phone]

## 2018-04-09 ENCOUNTER — Telehealth: Payer: Self-pay | Admitting: Internal Medicine

## 2018-04-09 NOTE — Telephone Encounter (Signed)
CALLED PATIENT AND LEFT MESSAGE AGAIN TO RESCHEDULE MISSED NOV 18TH APPOINTMENT.JW

## 2018-04-10 ENCOUNTER — Other Ambulatory Visit: Payer: Self-pay | Admitting: Family Medicine

## 2018-04-10 NOTE — Telephone Encounter (Signed)
Requested Prescriptions  Pending Prescriptions Disp Refills  . tretinoin (RETIN-A) 0.05 % cream [Pharmacy Med Name: TRETINOIN 0.05% CREAM] 45 g 0    Sig: APPLY TO AFFECTED AREA EVERY DAY AT BEDTIME     Dermatology:  Acne preparations Passed - 04/10/2018 10:49 AM      Passed - Valid encounter within last 12 months    Recent Outpatient Visits          2 months ago Mood disorder [facility](HCC)   [facility], Meda CoffeeIrma M, MD   2 months ago Mood disorder [facility](HCC)   [facility], Meda CoffeeIrma M, MD   2 months ago Mood disorder [facility](HCC)   [facility], Meda CoffeeIrma M, MD   3 months ago Mood disorder [facility](HCC)   [facility], Meda CoffeeIrma M, MD   3 months ago Migraine without aura and with status migrainosus, not intractable   [facility], Manus RuddZoe A, MD      Future Appointments            In 3 weeks Myles LippsSantiago, Irma M, MD [facility] [place], [facility]         . hydroquinone 4 % cream [Pharmacy Med Name: HYDROQUINONE 4% CREAM] 28.35 g 0    Sig: APPLY TO AFFECTED AREA TWICE A DAY     Dermatology:  Other Passed - 04/10/2018 10:49 AM      Passed - Valid encounter within last 12 months    Recent Outpatient Visits          2 months ago Mood disorder [facility](HCC)   [facility], Meda CoffeeIrma M, MD   2 months ago Mood disorder [facility](HCC)   [facility], Meda CoffeeIrma M, MD   2 months ago Mood disorder [facility](HCC)   [facility] [place], Meda CoffeeIrma M, MD   3 months ago Mood disorder [facility](HCC)   [facility] [place], Meda CoffeeIrma M, MD   3 months ago Migraine without aura and with status migrainosus, not intractable   [facility], Manus RuddZoe A, MD      Future Appointments            In 3 weeks Myles LippsSantiago, Irma M, MD [facility] [place], [facility]

## 2018-04-15 DIAGNOSIS — F4310 Post-traumatic stress disorder, unspecified: Secondary | ICD-10-CM | POA: Diagnosis not present

## 2018-04-15 DIAGNOSIS — F902 Attention-deficit hyperactivity disorder, combined type: Secondary | ICD-10-CM | POA: Diagnosis not present

## 2018-04-15 DIAGNOSIS — F319 Bipolar disorder, unspecified: Secondary | ICD-10-CM | POA: Diagnosis not present

## 2018-04-20 DIAGNOSIS — J209 Acute bronchitis, unspecified: Secondary | ICD-10-CM | POA: Diagnosis not present

## 2018-04-21 ENCOUNTER — Other Ambulatory Visit: Payer: Self-pay

## 2018-04-21 ENCOUNTER — Encounter: Payer: Self-pay | Admitting: Family Medicine

## 2018-04-21 ENCOUNTER — Ambulatory Visit (INDEPENDENT_AMBULATORY_CARE_PROVIDER_SITE_OTHER): Payer: BLUE CROSS/BLUE SHIELD | Admitting: Family Medicine

## 2018-04-21 ENCOUNTER — Other Ambulatory Visit: Payer: Self-pay | Admitting: Family Medicine

## 2018-04-21 VITALS — BP 118/82 | HR 99 | Temp 97.9°F | Resp 20 | Ht 67.21 in | Wt 302.4 lb

## 2018-04-21 DIAGNOSIS — F900 Attention-deficit hyperactivity disorder, predominantly inattentive type: Secondary | ICD-10-CM | POA: Diagnosis not present

## 2018-04-21 DIAGNOSIS — F3162 Bipolar disorder, current episode mixed, moderate: Secondary | ICD-10-CM

## 2018-04-21 DIAGNOSIS — J069 Acute upper respiratory infection, unspecified: Secondary | ICD-10-CM | POA: Diagnosis not present

## 2018-04-21 DIAGNOSIS — F3170 Bipolar disorder, currently in remission, most recent episode unspecified: Secondary | ICD-10-CM | POA: Diagnosis not present

## 2018-04-21 DIAGNOSIS — M9511 Cauliflower ear, right ear: Secondary | ICD-10-CM

## 2018-04-21 DIAGNOSIS — F410 Panic disorder [episodic paroxysmal anxiety] without agoraphobia: Secondary | ICD-10-CM

## 2018-04-21 DIAGNOSIS — F603 Borderline personality disorder: Secondary | ICD-10-CM

## 2018-04-21 DIAGNOSIS — F411 Generalized anxiety disorder: Secondary | ICD-10-CM | POA: Diagnosis not present

## 2018-04-21 DIAGNOSIS — Z6841 Body Mass Index (BMI) 40.0 and over, adult: Secondary | ICD-10-CM

## 2018-04-21 MED ORDER — LIRAGLUTIDE 18 MG/3ML ~~LOC~~ SOPN: 1.2000 mg | PEN_INJECTOR | Freq: Every day | SUBCUTANEOUS | 2 refills | Status: DC

## 2018-04-21 MED ORDER — TRAZODONE HCL 100 MG PO TABS: 100.0000 mg | ORAL_TABLET | Freq: Every day | ORAL | 3 refills | Status: DC

## 2018-04-21 MED ORDER — LAMOTRIGINE 100 MG PO TABS: 100.0000 mg | ORAL_TABLET | Freq: Every day | ORAL | 3 refills | Status: DC

## 2018-04-21 MED ORDER — QUETIAPINE FUMARATE 100 MG PO TABS: 100.0000 mg | ORAL_TABLET | Freq: Every day | ORAL | 3 refills | Status: DC

## 2018-04-21 MED ORDER — PEN NEEDLES 32G X 6 MM MISC: 1.0000 | Freq: Every day | 5 refills | Status: DC

## 2018-04-21 MED ORDER — VENLAFAXINE HCL ER 150 MG PO CP24: 150.0000 mg | ORAL_CAPSULE | Freq: Every day | ORAL | 3 refills | Status: DC

## 2018-04-21 MED ORDER — BUSPIRONE HCL 30 MG PO TABS: 30.0000 mg | ORAL_TABLET | Freq: Two times a day (BID) | ORAL | 3 refills | Status: DC

## 2018-04-21 NOTE — Progress Notes (Signed)
12/17/201911:31 AM  Kelli Howard 1990/09/07, 27 y.o. female [id number]  Chief Complaint  Patient presents with  . Cough    X 3-4 days with some ear pain both ears, nasal congestion  . Sore Throat    X 3-4 days  . Depression    and Anxiety screening done    HPI:   Patient is a 27 y.o. female with past medical history significant for bipolar disorder, border line personality obesity who presents today for followup  Last OV in oct 2019 At that point was going into inpatient treatment [place] from nov 4th to 27th dx of bipolar and borderline confirmed meds changed: lamictal 100mg , buspar 30mg  BID, effexor XR 150mg , seroquel 100mg  at bedtime, trazadone 100mg  at bedtime [facility] And will also be doing DBT Having brain fog since she started on this regime but otherwise emotionally stable, anxiety minimal, not having moods swings, sleeping well  Letter given for bariatric surgeon - candidate but needs have stable mood She is exercising and dieting She would like to start GLP1 as discussed last visit  Coughing yellow mucous and hoarseness, sore throat,  really congested, ear are achy, muffled No SOB Having headaches, pressure, neck is sore 3-4 days of sx Had fever 102 on first 2 days, responded to motrin + sick contacts  Went to [facility] yesterday Gave her azithromycin for bronchitis and enlarged tonsils Started yesterday  Also having pain behind ear piercing of right ear Piercing has been there for over a year Has noticed swelling  Fall Risk  04/21/2018 03/05/2018 02/07/2018 01/29/2018 01/15/2018  Falls in the past year? 0 No No No No     Depression screen [facility]HQ 2/9 04/21/2018 03/05/2018 02/07/2018  Decreased Interest 1 1 2   Down, Depressed, Hopeless 1 0 2  PHQ - 2 Score 2 1 4   Altered sleeping 1 - 2  Tired, decreased energy 1 - 2  Change in appetite 0 - 2  Feeling bad or failure about yourself  1 - 2  Trouble  concentrating 1 - 2  Moving slowly or fidgety/restless 1 - 1  Suicidal thoughts 0 - 0  PHQ-9 Score 7 - 15  Difficult doing work/chores Very difficult - Very difficult  Some encounter information is confidential and restricted. Go to Review Flowsheets activity to see all data.  Some recent data might be hidden    Allergies  Allergen Reactions  . Peanuts [Peanut Oil] Anaphylaxis    Also: tree nuts   . Other Rash    mushrooms  . Banana     Unknown reaction   . Celery Oil Itching and Rash  . Shellfish Allergy Rash    Prior to Admission medications   Medication Sig Start Date End Date Taking? Authorizing Provider  aluminum chloride (DRYSOL) 20 % external solution Apply topically at bedtime. 03/04/18  Yes Myles LippsSantiago, Harrie Cazarez M, MD  cyclobenzaprine (FLEXERIL) 10 MG tablet Take 1 tablet (10 mg total) by mouth 3 (three) times daily as needed for muscle spasms. 02/18/18  Yes Triplett, Cari B, FNP  EPINEPHrine (EPIPEN) 0.3 mg/0.3 mL IJ SOAJ injection Inject 0.3 mLs (0.3 mg total) into the muscle as needed. Brand only 05/31/16  Yes Bing NeighborsHarris, Kimberly S, FNP  fluticasone (FLONASE) 50 MCG/ACT nasal spray Place 1 spray into both nostrils 2 (two) times daily. 01/29/18  Yes Myles LippsSantiago, Brooksie Ellwanger M, MD  hydroquinone 4 % cream APPLY TO AFFECTED AREA TWICE A DAY 04/10/18  Yes Koren ShiverSantiago, Tricia Pledger M,  MD  ibuprofen (ADVIL,MOTRIN) 600 MG tablet Take 1 tablet (600 mg total) by mouth every 6 (six) hours as needed. 02/18/18  Yes Triplett, Cari B, FNP  lamoTRIgine (LAMICTAL) 25 MG tablet TAKE 1 TABLET BY MOUTH TWICE A DAY 04/03/18  Yes Myles Lipps, MD  naproxen (NAPROSYN) 500 MG tablet Take 1 tablet (500 mg total) by mouth 2 (two) times daily with a meal. 02/24/18  Yes Tommi Rumps, PA-C  norgestimate-ethinyl estradiol (ORTHO-CYCLEN,SPRINTEC,PREVIFEM) 0.25-35 MG-MCG tablet Take 1 tablet by mouth daily. 10/23/17  Yes Myles Lipps, MD  omeprazole (PRILOSEC) 20 MG capsule Take 1 capsule (20 mg total) by mouth daily.  10/23/17  Yes Myles Lipps, MD  QUEtiapine (SEROQUEL) 100 MG tablet Take 1 tablet (100 mg total) by mouth at bedtime. 10/23/17  Yes Myles Lipps, MD  QUEtiapine (SEROQUEL) 25 MG tablet TAKE 1 TABLET (25 MG TOTAL) BY MOUTH DAILY AS NEEDED. 11/21/17  Yes Myles Lipps, MD  traZODone (DESYREL) 100 MG tablet Take 2 tablets (200 mg total) by mouth at bedtime. 10/23/17  Yes Myles Lipps, MD  tretinoin (RETIN-A) 0.05 % cream APPLY TO AFFECTED AREA EVERY DAY AT BEDTIME 04/10/18  Yes Myles Lipps, MD    Past Medical History:  Diagnosis Date  . Allergy   . Anemia   . Enlarged heart   . Heart murmur   . Vitamin D deficiency     Past Surgical History:  Procedure Laterality Date  . DENTAL SURGERY    . TONSILLECTOMY      Social History   Tobacco Use  . Smoking status: Never Smoker  . Smokeless tobacco: Never Used  Substance Use Topics  . Alcohol use: Yes    Comment: occ    Family History  Problem Relation Age of Onset  . Hypertension Mother   . Thyroid disease Mother   . Hypertension Sister   . Heart disease Sister   . Anxiety disorder Brother   . Depression Brother   . ADD / ADHD Other     ROS Per hpi  OBJECTIVE:  Blood pressure 118/82, pulse 99, temperature 97.9 F (36.6 C), temperature source Oral, resp. rate 20, height 5' 7.21" (1.707 m), weight (!) 302 lb 6.4 oz (137.2 kg), SpO2 96 %. Body mass index is 47.07 kg/m.   Wt Readings from Last 3 Encounters:  04/21/18 (!) 302 lb 6.4 oz (137.2 kg)  03/05/18 (!) 306 lb (138.8 kg)  02/24/18 (!) 310 lb (140.6 kg)    Physical Exam Vitals signs and nursing note reviewed.  Constitutional:      Appearance: She is well-developed.  HENT:     Head: Normocephalic and atraumatic.     Right Ear: Hearing, tympanic membrane, ear canal and external ear normal.     Left Ear: Hearing, tympanic membrane, ear canal and external ear normal.     Nose:     Right Turbinates: Not swollen or pale.     Left Turbinates: Not  swollen or pale.     Mouth/Throat:     Pharynx: No posterior oropharyngeal erythema.     Tonsils: Swelling: 0 on the right. 0 on the left.  Eyes:     Conjunctiva/sclera: Conjunctivae normal.     Pupils: Pupils are equal, round, and reactive to light.  Neck:     Musculoskeletal: Neck supple.  Cardiovascular:     Rate and Rhythm: Normal rate and regular rhythm.     Heart sounds: Normal heart sounds. No  murmur. No friction rub. No gallop.   Pulmonary:     Effort: Pulmonary effort is normal.     Breath sounds: Normal breath sounds. No wheezing or rales.  Lymphadenopathy:     Cervical: No cervical adenopathy.  Skin:    General: Skin is warm and dry.  Neurological:     Mental Status: She is alert and oriented to person, place, and time.     ASSESSMENT and PLAN  1. Bipolar disorder, current episode mixed, moderate (HCC) Stable, cont current regime, to establish care with psych tomorrow  2. Generalized anxiety disorder with panic attacks See #1  3. Acute upper respiratory infection Discussed supportive measures for URI: increase hydration, rest, OTC medications, etc. RTC precautions discussed.  4. Morbid obesity with BMI of 45.0-49.9, adult (HCC) Starting trulicity, r/se/b reviewed  5. Cauliflower ear, right Take off piercing, very small, consider plastics if continues to cause pain  Other orders - lamoTRIgine (LAMICTAL) 100 MG tablet; Take 1 tablet (100 mg total) by mouth daily. - venlafaxine XR (EFFEXOR-XR) 150 MG 24 hr capsule; Take 1 capsule (150 mg total) by mouth daily with breakfast. - busPIRone (BUSPAR) 30 MG tablet; Take 1 tablet (30 mg total) by mouth 2 (two) times daily. - QUEtiapine (SEROQUEL) 100 MG tablet; Take 1 tablet (100 mg total) by mouth at bedtime. - traZODone (DESYREL) 100 MG tablet; Take 1 tablet (100 mg total) by mouth at bedtime. - liraglutide (VICTOZA) 18 MG/3ML SOPN; Inject 0.2 mLs (1.2 mg total) into the skin daily.  Return in about 4 weeks (around  05/19/2018) for weight.    Myles Lipps, MD [facility] [address] [place], [place] [address] Ph.  [phone] Fax [phone]

## 2018-04-21 NOTE — Patient Instructions (Addendum)
Start victoza 0.6mg  once a day for 1 week, then increase to 1.2mg  once a day  Liraglutide injection What is this medicine? LIRAGLUTIDE (LIR a GLOO tide) is used to improve blood sugar control in adults with type 2 diabetes. This medicine may be used with other diabetes medicines. This drug may also reduce the risk of heart attack or stroke if you have type 2 diabetes and risk factors for heart disease. This medicine may be used for other purposes; ask your health care provider or pharmacist if you have questions. COMMON BRAND NAME(S): Victoza What should I tell my health care provider before I take this medicine? They need to know if you have any of these conditions: -endocrine tumors (MEN 2) or if someone in your family had these tumors -gallbladder disease -high cholesterol -history of alcohol abuse problem -history of pancreatitis -kidney disease or if you are on dialysis -liver disease -previous swelling of the tongue, face, or lips with difficulty breathing, difficulty swallowing, hoarseness, or tightening of the throat -stomach problems -thyroid cancer or if someone in your family had thyroid cancer -an unusual or allergic reaction to liraglutide, other medicines, foods, dyes, or preservatives -pregnant or trying to get pregnant -breast-feeding How should I use this medicine? This medicine is for injection under the skin of your upper leg, stomach area, or upper arm. You will be taught how to prepare and give this medicine. Use exactly as directed. Take your medicine at regular intervals. Do not take it more often than directed. It is important that you put your used needles and syringes in a special sharps container. Do not put them in a trash can. If you do not have a sharps container, call your pharmacist or healthcare provider to get one. A special MedGuide will be given to you by the pharmacist with each prescription and refill. Be sure to read this information carefully each  time. Talk to your pediatrician regarding the use of this medicine in children. Special care may be needed. Overdosage: If you think you have taken too much of this medicine contact a poison control center or emergency room at once. NOTE: This medicine is only for you. Do not share this medicine with others. What if I miss a dose? If you miss a dose, take it as soon as you can. If it is almost time for your next dose, take only that dose. Do not take double or extra doses. What may interact with this medicine? -other medicines for diabetes Many medications may cause changes in blood sugar, these include: -alcohol containing beverages -antiviral medicines for HIV or AIDS -aspirin and aspirin-like drugs -certain medicines for blood pressure, heart disease, irregular heart beat -chromium -diuretics -female hormones, such as estrogens or progestins, birth control pills -fenofibrate -gemfibrozil -isoniazid -lanreotide -female hormones or anabolic steroids -MAOIs like Carbex, Eldepryl, Marplan, Nardil, and Parnate -medicines for weight loss -medicines for allergies, asthma, cold, or cough -medicines for depression, anxiety, or psychotic disturbances -niacin -nicotine -NSAIDs, medicines for pain and inflammation, like ibuprofen or naproxen -octreotide -pasireotide -pentamidine -phenytoin -probenecid -quinolone antibiotics such as ciprofloxacin, levofloxacin, ofloxacin -some herbal dietary supplements -steroid medicines such as prednisone or cortisone -sulfamethoxazole; trimethoprim -thyroid hormones Some medications can hide the warning symptoms of low blood sugar (hypoglycemia). You may need to monitor your blood sugar more closely if you are taking one of these medications. These include: -beta-blockers, often used for high blood pressure or heart problems (examples include atenolol, metoprolol, propranolol) -clonidine -guanethidine -reserpine This list may  not describe all  possible interactions. Give your health care provider a list of all the medicines, herbs, non-prescription drugs, or dietary supplements you use. Also tell them if you smoke, drink alcohol, or use illegal drugs. Some items may interact with your medicine. What should I watch for while using this medicine? Visit your doctor or health care professional for regular checks on your progress. Drink plenty of fluids while taking this medicine. Check with your doctor or health care professional if you get an attack of severe diarrhea, nausea, and vomiting. The loss of too much body fluid can make it dangerous for you to take this medicine. A test called the HbA1C (A1C) will be monitored. This is a simple blood test. It measures your blood sugar control over the last 2 to 3 months. You will receive this test every 3 to 6 months. Learn how to check your blood sugar. Learn the symptoms of low and high blood sugar and how to manage them. Always carry a quick-source of sugar with you in case you have symptoms of low blood sugar. Examples include hard sugar candy or glucose tablets. Make sure others know that you can choke if you eat or drink when you develop serious symptoms of low blood sugar, such as seizures or unconsciousness. They must get medical help at once. Tell your doctor or health care professional if you have high blood sugar. You might need to change the dose of your medicine. If you are sick or exercising more than usual, you might need to change the dose of your medicine. Do not skip meals. Ask your doctor or health care professional if you should avoid alcohol. Many nonprescription cough and cold products contain sugar or alcohol. These can affect blood sugar. Pens should never be shared. Even if the needle is changed, sharing may result in passing of viruses like hepatitis or HIV. Wear a medical ID bracelet or chain, and carry a card that describes your disease and details of your medicine and dosage  times. What side effects may I notice from receiving this medicine? Side effects that you should report to your doctor or health care professional as soon as possible: -allergic reactions like skin rash, itching or hives, swelling of the face, lips, or tongue -breathing problems -diarrhea that continues or is severe -lump or swelling on the neck -severe nausea -signs and symptoms of infection like fever or chills; cough; sore throat; pain or trouble passing urine -signs and symptoms of low blood sugar such as feeling anxious, confusion, dizziness, increased hunger, unusually weak or tired, sweating, shakiness, cold, irritable, headache, blurred vision, fast heartbeat, loss of consciousness -signs and symptoms of kidney injury like trouble passing urine or change in the amount of urine -trouble swallowing -unusual stomach upset or pain -vomiting Side effects that usually do not require medical attention (report to your doctor or health care professional if they continue or are bothersome): -constipation -decreased appetite -diarrhea -fatigue -headache -nausea -pain, redness, or irritation at site where injected -stomach upset -stuffy or runny nose This list may not describe all possible side effects. Call your doctor for medical advice about side effects. You may report side effects to FDA at 1-800-FDA-1088. Where should I keep my medicine? Keep out of the reach of children. Store unopened pen in a refrigerator between 2 and 8 degrees C (36 and 46 degrees F). Do not freeze or use if the medicine has been frozen. Protect from light and excessive heat. After you first use  the pen, it can be stored at room temperature between 15 and 30 degrees C (59 and 86 degrees F) or in a refrigerator. Throw away your used pen after 30 days or after the expiration date, whichever comes first. Do not store your pen with the needle attached. If the needle is left on, medicine may leak from the pen. NOTE:  This sheet is a summary. It may not cover all possible information. If you have questions about this medicine, talk to your doctor, pharmacist, or health care provider.  2018 Elsevier/Gold Standard (2016-05-09 14:39:40)

## 2018-04-21 NOTE — Addendum Note (Signed)
Addended by: Myles LippsSANTIAGO, IRMA M on: 04/21/2018 04:23 PM   Modules accepted: Orders

## 2018-04-22 ENCOUNTER — Telehealth: Payer: Self-pay | Admitting: Family Medicine

## 2018-04-22 NOTE — Telephone Encounter (Signed)
Copied from CRM [id number]. Topic: General - Other >> Apr 22, 2018  2:12 PM Percival SpanishKennedy, Cheryl W wrote: Pt said she need these needles called today    pt said the wrong needles was called into her pharmacy. She said the correct needles is Bd Nano ultra fine needles for the liraglutide (VICTOZA) 18 MG/3ML SOPN   Phamracy   [facility] [place]

## 2018-04-23 ENCOUNTER — Other Ambulatory Visit: Payer: Self-pay

## 2018-04-23 ENCOUNTER — Telehealth: Payer: Self-pay | Admitting: Family Medicine

## 2018-04-23 MED ORDER — INSULIN PEN NEEDLE 32G X 4 MM MISC: 1.0000 | Freq: Every day | 5 refills | Status: DC

## 2018-04-23 NOTE — Telephone Encounter (Signed)
Please advise 

## 2018-04-23 NOTE — Telephone Encounter (Signed)
Needles updated to pt preference.  Pharmacy advised.

## 2018-04-23 NOTE — Telephone Encounter (Signed)
Copied from CRM [phone]. Topic: Quick Communication - See Telephone Encounter >> Apr 23, 2018  4:50 PM Jens SomMedley, Jennifer A wrote: CRM for notification. See Telephone encounter for: 04/23/18.  Patient called back to check on her nano ultra fine needles. Spoke with chanda said the needles would be called in. Please advise

## 2018-04-24 DIAGNOSIS — F900 Attention-deficit hyperactivity disorder, predominantly inattentive type: Secondary | ICD-10-CM | POA: Diagnosis not present

## 2018-04-24 DIAGNOSIS — F3170 Bipolar disorder, currently in remission, most recent episode unspecified: Secondary | ICD-10-CM | POA: Diagnosis not present

## 2018-04-24 NOTE — Telephone Encounter (Signed)
Please call pharmacy and clarify as it seems this has been sent already. thanks

## 2018-05-05 ENCOUNTER — Ambulatory Visit: Payer: BLUE CROSS/BLUE SHIELD | Admitting: Family Medicine

## 2018-05-06 ENCOUNTER — Other Ambulatory Visit: Payer: Self-pay | Admitting: Family Medicine

## 2018-05-12 DIAGNOSIS — F900 Attention-deficit hyperactivity disorder, predominantly inattentive type: Secondary | ICD-10-CM | POA: Diagnosis not present

## 2018-05-12 DIAGNOSIS — F3170 Bipolar disorder, currently in remission, most recent episode unspecified: Secondary | ICD-10-CM | POA: Diagnosis not present

## 2018-05-19 ENCOUNTER — Ambulatory Visit: Payer: BLUE CROSS/BLUE SHIELD | Admitting: Family Medicine

## 2018-05-19 DIAGNOSIS — F900 Attention-deficit hyperactivity disorder, predominantly inattentive type: Secondary | ICD-10-CM | POA: Diagnosis not present

## 2018-05-19 DIAGNOSIS — F3170 Bipolar disorder, currently in remission, most recent episode unspecified: Secondary | ICD-10-CM | POA: Diagnosis not present

## 2018-05-20 ENCOUNTER — Other Ambulatory Visit: Payer: Self-pay

## 2018-05-20 ENCOUNTER — Encounter: Payer: Self-pay | Admitting: Emergency Medicine

## 2018-05-20 ENCOUNTER — Ambulatory Visit (INDEPENDENT_AMBULATORY_CARE_PROVIDER_SITE_OTHER): Payer: BLUE CROSS/BLUE SHIELD | Admitting: Emergency Medicine

## 2018-05-20 VITALS — BP 122/81 | HR 86 | Temp 99.0°F | Resp 16 | Ht 65.5 in | Wt 286.6 lb

## 2018-05-20 DIAGNOSIS — N939 Abnormal uterine and vaginal bleeding, unspecified: Secondary | ICD-10-CM | POA: Diagnosis not present

## 2018-05-20 DIAGNOSIS — Z113 Encounter for screening for infections with a predominantly sexual mode of transmission: Secondary | ICD-10-CM | POA: Diagnosis not present

## 2018-05-20 DIAGNOSIS — Z114 Encounter for screening for human immunodeficiency virus [HIV]: Secondary | ICD-10-CM | POA: Diagnosis not present

## 2018-05-20 LAB — COMPREHENSIVE METABOLIC PANEL
ALBUMIN: 4.2 g/dL (ref 3.5–5.5)
ALT: 59 IU/L — AB (ref 0–32)
AST: 30 IU/L (ref 0–40)
Albumin/Globulin Ratio: 1.5 (ref 1.2–2.2)
Alkaline Phosphatase: 80 IU/L (ref 39–117)
BUN / CREAT RATIO: 11 (ref 9–23)
BUN: 9 mg/dL (ref 6–20)
CO2: 21 mmol/L (ref 20–29)
CREATININE: 0.81 mg/dL (ref 0.57–1.00)
Calcium: 10 mg/dL (ref 8.7–10.2)
Chloride: 104 mmol/L (ref 96–106)
GFR calc non Af Amer: 101 mL/min/{1.73_m2} (ref >59)
GFR, EST AFRICAN AMERICAN: 117 mL/min/{1.73_m2} (ref >59)
Globulin, Total: 2.8 g/dL (ref 1.5–4.5)
Glucose: 94 mg/dL (ref 65–99)
Potassium: 4.7 mmol/L (ref 3.5–5.2)
Sodium: 143 mmol/L (ref 134–144)
TOTAL PROTEIN: 7 g/dL (ref 6.0–8.5)

## 2018-05-20 LAB — CBC WITH DIFFERENTIAL/PLATELET
BASOS: 1 %
Basophils Absolute: 0 10*3/uL (ref 0.0–0.2)
EOS (ABSOLUTE): 0.2 10*3/uL (ref 0.0–0.4)
EOS: 2 %
HEMATOCRIT: 40.2 % (ref 34.0–46.6)
Hemoglobin: 12.8 g/dL (ref 11.1–15.9)
Immature Grans (Abs): 0 10*3/uL (ref 0.0–0.1)
Immature Granulocytes: 0 %
LYMPHS ABS: 2 10*3/uL (ref 0.7–3.1)
Lymphs: 29 %
MCH: 26.6 pg (ref 26.6–33.0)
MCHC: 31.8 g/dL (ref 31.5–35.7)
MCV: 84 fL (ref 79–97)
Monocytes Absolute: 0.5 10*3/uL (ref 0.1–0.9)
Monocytes: 7 %
NEUTROS ABS: 4.2 10*3/uL (ref 1.4–7.0)
Neutrophils: 61 %
Platelets: 326 10*3/uL (ref 150–450)
RBC: 4.81 x10E6/uL (ref 3.77–5.28)
RDW: 14.1 % (ref 11.7–15.4)
WBC: 6.8 10*3/uL (ref 3.4–10.8)

## 2018-05-20 MED ORDER — EPINEPHRINE 0.3 MG/0.3ML IJ SOAJ: 0.3000 mg | INTRAMUSCULAR | 12 refills | Status: DC | PRN

## 2018-05-20 NOTE — Assessment & Plan Note (Signed)
Unlikely to be related to Victoza.  Most likely dysfunctional uterine bleeding.  Clinically stable with stable vital signs.  May stop Victoza for a while and monitor bleeding.  May need referral to GYN doctor but for now I recommend to follow-up with Dr. Leretha PolSantiago as scheduled.

## 2018-05-20 NOTE — Progress Notes (Signed)
Kelli Howard 27 y.o.   Chief Complaint  Patient presents with  . medication review    per patient since she has been on Victoza it causes vaginal bleeding and patient want to start Descovy    HISTORY OF PRESENT ILLNESS: This is a 28 y.o. female complaining of vaginal bleeding for the past week and a half.  She was started on Victoza 1 month ago for weight loss.  No history of diabetes.  Has been on birth control for a well.  Last normal menstrual period was in 2016.  Pelvic cramping as well.  Otherwise doing well with no other significant symptoms. Patient of Dr. Leretha PolSantiago.  Missed her appointment yesterday.  Also requesting to be started on Descovy for HIV prep.  HPI   Prior to Admission medications   Medication Sig Start Date End Date Taking? Authorizing Provider  aluminum chloride (DRYSOL) 20 % external solution Apply topically at bedtime. 03/04/18  Yes Myles LippsSantiago, Irma M, MD  busPIRone (BUSPAR) 30 MG tablet Take 1 tablet (30 mg total) by mouth 2 (two) times daily. 04/21/18  Yes Myles LippsSantiago, Irma M, MD  EPINEPHrine (EPIPEN) 0.3 mg/0.3 mL IJ SOAJ injection Inject 0.3 mLs (0.3 mg total) into the muscle as needed. Brand only 05/31/16  Yes Bing NeighborsHarris, Kimberly S, FNP  fluticasone (FLONASE) 50 MCG/ACT nasal spray Place 1 spray into both nostrils 2 (two) times daily. 01/29/18  Yes Myles LippsSantiago, Irma M, MD  hydroquinone 4 % cream APPLY TO AFFECTED AREA TWICE A DAY 04/10/18  Yes Myles LippsSantiago, Irma M, MD  ibuprofen (ADVIL,MOTRIN) 600 MG tablet Take 1 tablet (600 mg total) by mouth every 6 (six) hours as needed. 02/18/18  Yes Triplett, Cari B, FNP  lamoTRIgine (LAMICTAL) 100 MG tablet Take 1 tablet (100 mg total) by mouth daily. 04/21/18  Yes Myles LippsSantiago, Irma M, MD  liraglutide (VICTOZA) 18 MG/3ML SOPN Inject 0.2 mLs (1.2 mg total) into the skin daily. 04/21/18  Yes Myles LippsSantiago, Irma M, MD  norgestimate-ethinyl estradiol (ORTHO-CYCLEN,SPRINTEC,PREVIFEM) 0.25-35 MG-MCG tablet Take 1 tablet by mouth daily. 10/23/17  Yes  Myles LippsSantiago, Irma M, MD  omeprazole (PRILOSEC) 20 MG capsule Take 1 capsule (20 mg total) by mouth daily. 10/23/17  Yes Myles LippsSantiago, Irma M, MD  QUEtiapine (SEROQUEL) 100 MG tablet Take 1 tablet (100 mg total) by mouth at bedtime. 04/21/18  Yes Myles LippsSantiago, Irma M, MD  traZODone (DESYREL) 100 MG tablet Take 1 tablet (100 mg total) by mouth at bedtime. 04/21/18  Yes Myles LippsSantiago, Irma M, MD  tretinoin (RETIN-A) 0.05 % cream APPLY TO AFFECTED AREA EVERY DAY AT BEDTIME 04/10/18  Yes Myles LippsSantiago, Irma M, MD  venlafaxine XR (EFFEXOR-XR) 150 MG 24 hr capsule Take 1 capsule (150 mg total) by mouth daily with breakfast. 04/21/18  Yes Myles LippsSantiago, Irma M, MD  cyclobenzaprine (FLEXERIL) 10 MG tablet Take 1 tablet (10 mg total) by mouth 3 (three) times daily as needed for muscle spasms. Patient not taking: Reported on 05/20/2018 02/18/18   Kem Boroughsriplett, Cari B, FNP  Insulin Pen Needle (BD PEN NEEDLE NANO U/F) 32G X 4 MM MISC 1 Product by Does not apply route daily. Use with Victoza 04/23/18   Myles LippsSantiago, Irma M, MD  naproxen (NAPROSYN) 500 MG tablet Take 1 tablet (500 mg total) by mouth 2 (two) times daily with a meal. Patient not taking: Reported on 05/20/2018 02/24/18   Tommi RumpsSummers, Rhonda L, PA-C    Allergies  Allergen Reactions  . Peanuts [Peanut Oil] Anaphylaxis    Also: tree nuts   . Other Rash  mushrooms  . Banana     Unknown reaction   . Celery Oil Itching and Rash  . Shellfish Allergy Rash    Patient Active Problem List   Diagnosis Date Noted  . Bipolar disorder, current episode mixed, moderate (HCC) 04/21/2018  . Borderline personality disorder (HCC) 04/21/2018  . Migraine without aura and with status migrainosus, not intractable 12/14/2017  . Generalized anxiety disorder with panic attacks 12/14/2017    Past Medical History:  Diagnosis Date  . Allergy   . Anemia   . Enlarged heart   . Heart murmur   . Vitamin D deficiency     Past Surgical History:  Procedure Laterality Date  . DENTAL SURGERY    .  TONSILLECTOMY      Social History   Socioeconomic History  . Marital status: Single    Spouse name: Not on file  . Number of children: 0  . Years of education: Not on file  . Highest education level: Not on file  Occupational History  . Not on file  Social Needs  . Financial resource strain: Not very hard  . Food insecurity:    Worry: Never true    Inability: Never true  . Transportation needs:    Medical: No    Non-medical: No  Tobacco Use  . Smoking status: Never Smoker  . Smokeless tobacco: Never Used  Substance and Sexual Activity  . Alcohol use: Yes    Comment: occ  . Drug use: Yes    Frequency: 2.0 times per week    Types: Marijuana    Comment: None over the past monthh  . Sexual activity: Not Currently    Birth control/protection: Pill  Lifestyle  . Physical activity:    Days per week: 1 day    Minutes per session: 30 min  . Stress: Rather much  Relationships  . Social connections:    Talks on phone: More than three times a week    Gets together: More than three times a week    Attends religious service: Never    Active member of club or organization: No    Attends meetings of clubs or organizations: Never    Relationship status: Never married  . Intimate partner violence:    Fear of current or ex partner: No    Emotionally abused: No    Physically abused: No    Forced sexual activity: No  Other Topics Concern  . Not on file  Social History Narrative  . Not on file    Family History  Problem Relation Age of Onset  . Hypertension Mother   . Thyroid disease Mother   . Hypertension Sister   . Heart disease Sister   . Anxiety disorder Brother   . Depression Brother   . ADD / ADHD Other      Review of Systems  Constitutional: Negative.  Negative for chills and fever.  HENT: Negative.   Eyes: Negative.  Negative for blurred vision and double vision.  Respiratory: Negative.  Negative for cough and shortness of breath.   Cardiovascular:  Negative.  Negative for chest pain and palpitations.  Gastrointestinal: Negative.  Negative for abdominal pain, diarrhea, nausea and vomiting.  Genitourinary: Negative.   Musculoskeletal: Negative.   Skin: Negative.  Negative for rash.  Neurological: Negative.  Negative for dizziness and headaches.  Endo/Heme/Allergies: Negative.   All other systems reviewed and are negative.   Vitals:   05/20/18 0823  BP: 122/81  Pulse: 86  Resp:  16  Temp: 99 F (37.2 C)  SpO2: 97%    Physical Exam Vitals signs reviewed.  Constitutional:      Appearance: She is obese.  HENT:     Head: Normocephalic and atraumatic.     Mouth/Throat:     Mouth: Mucous membranes are moist.     Pharynx: Oropharynx is clear.  Eyes:     Extraocular Movements: Extraocular movements intact.     Conjunctiva/sclera: Conjunctivae normal.     Pupils: Pupils are equal, round, and reactive to light.  Neck:     Musculoskeletal: Normal range of motion and neck supple.  Cardiovascular:     Rate and Rhythm: Normal rate and regular rhythm.     Heart sounds: Normal heart sounds.  Pulmonary:     Effort: Pulmonary effort is normal.     Breath sounds: Normal breath sounds.  Abdominal:     Palpations: Abdomen is soft.     Tenderness: There is no abdominal tenderness.  Musculoskeletal: Normal range of motion.  Skin:    General: Skin is warm and dry.  Neurological:     General: No focal deficit present.     Mental Status: She is alert and oriented to person, place, and time.  Psychiatric:        Mood and Affect: Mood normal.        Behavior: Behavior normal.    A total of 25 minutes was spent in the room with the patient, greater than 50% of which was in counseling/coordination of care regarding differential diagnosis, treatment, medications, and need for follow-up with GYN doctor and Dr. Leretha PolSantiago.   ASSESSMENT & PLAN: Abnormal vaginal bleeding Unlikely to be related to Victoza.  Most likely dysfunctional uterine  bleeding.  Clinically stable with stable vital signs.  May stop Victoza for a while and monitor bleeding.  May need referral to GYN doctor but for now I recommend to follow-up with Dr. Leretha PolSantiago as scheduled.  Kelli Howard was seen today for medication review and medication refill.  Diagnoses and all orders for this visit:  Abnormal vaginal bleeding -     CBC with Differential/Platelet -     Comprehensive metabolic panel  Other orders -     EPINEPHrine (EPIPEN) 0.3 mg/0.3 mL IJ SOAJ injection; Inject 0.3 mLs (0.3 mg total) into the muscle as needed. Brand only    Patient Instructions       If you have lab work done today you will be contacted with your lab results within the next 2 weeks.  If you have not heard from us then please contact us. The fastest way to get your results is to register for My Chart.   IF you received an x-ray today, you will receive an invoice from [facility]. Please contact [facility] at [phone] with questions or concerns regarding your invoice.   IF you received labwork today, you will receive an invoice from [facility]. Please contact [facility] at [phone] with questions or concerns regarding your invoice.   Our billing staff will not be able to assist you with questions regarding bills from these companies.  You will be contacted with the lab results as soon as they are available. The fastest way to get your results is to activate your My Chart account. Instructions are located on the last page of this paperwork. If you have not heard from us regarding the results in 2 weeks, please contact this office.     Abnormal Uterine Bleeding Abnormal uterine bleeding means  bleeding more than usual from your uterus. It can include:  Bleeding between periods.  Bleeding after sex.  Bleeding that is heavier than normal.  Periods that last longer than usual.  Bleeding after you have stopped having your period (menopause). There are many  problems that may cause this. You should see a doctor for any kind of bleeding that is not normal. Treatment depends on the cause of the bleeding. Follow these instructions at home:  Watch your condition for any changes.  Do not use tampons, douche, or have sex, if your doctor tells you not to.  Change your pads often.  Get regular well-woman exams. Make sure they include a pelvic exam and cervical cancer screening.  Keep all follow-up visits as told by your doctor. This is important. Contact a doctor if:  The bleeding lasts more than one week.  You feel dizzy at times.  You feel like you are going to throw up (nauseous).  You throw up. Get help right away if:  You pass out.  You have to change pads every hour.  You have belly (abdominal) pain.  You have a fever.  You get sweaty.  You get weak.  You passing large blood clots from your vagina. Summary  Abnormal uterine bleeding means bleeding more than usual from your uterus.  There are many problems that may cause this. You should see a doctor for any kind of bleeding that is not normal.  Treatment depends on the cause of the bleeding. This information is not intended to replace advice given to you by your health care provider. Make sure you discuss any questions you have with your health care provider. Document Released: 02/17/2009 Document Revised: 04/16/2016 Document Reviewed: 04/16/2016 Elsevier Interactive Patient Education  2019 Elsevier Inc.      Edwina Barth, MD [facility]

## 2018-05-20 NOTE — Patient Instructions (Addendum)
     If you have lab work done today you will be contacted with your lab results within the next 2 weeks.  If you have not heard from us then please contact us. The fastest way to get your results is to register for My Chart.   IF you received an x-ray today, you will receive an invoice from [facility]. Please contact [facility] at [phone] with questions or concerns regarding your invoice.   IF you received labwork today, you will receive an invoice from [facility]. Please contact [facility] at [phone] with questions or concerns regarding your invoice.   Our billing staff will not be able to assist you with questions regarding bills from these companies.  You will be contacted with the lab results as soon as they are available. The fastest way to get your results is to activate your My Chart account. Instructions are located on the last page of this paperwork. If you have not heard from us regarding the results in 2 weeks, please contact this office.     Abnormal Uterine Bleeding Abnormal uterine bleeding means bleeding more than usual from your uterus. It can include:  Bleeding between periods.  Bleeding after sex.  Bleeding that is heavier than normal.  Periods that last longer than usual.  Bleeding after you have stopped having your period (menopause). There are many problems that may cause this. You should see a doctor for any kind of bleeding that is not normal. Treatment depends on the cause of the bleeding. Follow these instructions at home:  Watch your condition for any changes.  Do not use tampons, douche, or have sex, if your doctor tells you not to.  Change your pads often.  Get regular well-woman exams. Make sure they include a pelvic exam and cervical cancer screening.  Keep all follow-up visits as told by your doctor. This is important. Contact a doctor if:  The bleeding lasts more than one week.  You feel dizzy at times.  You feel  like you are going to throw up (nauseous).  You throw up. Get help right away if:  You pass out.  You have to change pads every hour.  You have belly (abdominal) pain.  You have a fever.  You get sweaty.  You get weak.  You passing large blood clots from your vagina. Summary  Abnormal uterine bleeding means bleeding more than usual from your uterus.  There are many problems that may cause this. You should see a doctor for any kind of bleeding that is not normal.  Treatment depends on the cause of the bleeding. This information is not intended to replace advice given to you by your health care provider. Make sure you discuss any questions you have with your health care provider. Document Released: 02/17/2009 Document Revised: 04/16/2016 Document Reviewed: 04/16/2016 Elsevier Interactive Patient Education  2019 Elsevier Inc.  

## 2018-05-25 ENCOUNTER — Telehealth: Payer: Self-pay

## 2018-05-25 DIAGNOSIS — F900 Attention-deficit hyperactivity disorder, predominantly inattentive type: Secondary | ICD-10-CM | POA: Diagnosis not present

## 2018-05-25 DIAGNOSIS — F3170 Bipolar disorder, currently in remission, most recent episode unspecified: Secondary | ICD-10-CM | POA: Diagnosis not present

## 2018-05-25 NOTE — Telephone Encounter (Signed)
Lab letter sent via mail to pt home address. Dgaddy, CMA 

## 2018-05-26 DIAGNOSIS — F3170 Bipolar disorder, currently in remission, most recent episode unspecified: Secondary | ICD-10-CM | POA: Diagnosis not present

## 2018-05-26 DIAGNOSIS — F900 Attention-deficit hyperactivity disorder, predominantly inattentive type: Secondary | ICD-10-CM | POA: Diagnosis not present

## 2018-05-28 ENCOUNTER — Other Ambulatory Visit: Payer: Self-pay | Admitting: Family Medicine

## 2018-05-28 DIAGNOSIS — Z0271 Encounter for disability determination: Secondary | ICD-10-CM

## 2018-05-28 NOTE — Telephone Encounter (Signed)
Requested Prescriptions  Pending Prescriptions Disp Refills  . hydroquinone 4 % cream [Pharmacy Med Name: HYDROQUINONE 4% CREAM] 28.35 g 0    Sig: APPLY TO AFFECTED AREA TWICE A DAY     Dermatology:  Other Passed - 05/28/2018  2:20 PM      Passed - Valid encounter within last 12 months    Recent Outpatient Visits          1 week ago Abnormal vaginal bleeding   [facility], [place], MD   1 month ago Bipolar disorder, current episode mixed, moderate (HCC)   [facility], Meda Coffee, MD   3 months ago Mood disorder [facility])   [facility], Meda Coffee, MD   3 months ago Mood disorder [facility])   [facility], Meda Coffee, MD   4 months ago Mood disorder [facility])   [facility], Meda Coffee, MD

## 2018-06-01 DIAGNOSIS — F900 Attention-deficit hyperactivity disorder, predominantly inattentive type: Secondary | ICD-10-CM | POA: Diagnosis not present

## 2018-06-01 DIAGNOSIS — F3170 Bipolar disorder, currently in remission, most recent episode unspecified: Secondary | ICD-10-CM | POA: Diagnosis not present

## 2018-06-09 DIAGNOSIS — F3170 Bipolar disorder, currently in remission, most recent episode unspecified: Secondary | ICD-10-CM | POA: Diagnosis not present

## 2018-06-09 DIAGNOSIS — F900 Attention-deficit hyperactivity disorder, predominantly inattentive type: Secondary | ICD-10-CM | POA: Diagnosis not present

## 2018-06-11 DIAGNOSIS — F3170 Bipolar disorder, currently in remission, most recent episode unspecified: Secondary | ICD-10-CM | POA: Diagnosis not present

## 2018-06-11 DIAGNOSIS — F900 Attention-deficit hyperactivity disorder, predominantly inattentive type: Secondary | ICD-10-CM | POA: Diagnosis not present

## 2018-06-16 DIAGNOSIS — F900 Attention-deficit hyperactivity disorder, predominantly inattentive type: Secondary | ICD-10-CM | POA: Diagnosis not present

## 2018-06-16 DIAGNOSIS — F3170 Bipolar disorder, currently in remission, most recent episode unspecified: Secondary | ICD-10-CM | POA: Diagnosis not present

## 2018-06-18 DIAGNOSIS — F900 Attention-deficit hyperactivity disorder, predominantly inattentive type: Secondary | ICD-10-CM | POA: Diagnosis not present

## 2018-06-18 DIAGNOSIS — F3170 Bipolar disorder, currently in remission, most recent episode unspecified: Secondary | ICD-10-CM | POA: Diagnosis not present

## 2018-06-25 DIAGNOSIS — F900 Attention-deficit hyperactivity disorder, predominantly inattentive type: Secondary | ICD-10-CM | POA: Diagnosis not present

## 2018-06-25 DIAGNOSIS — F3170 Bipolar disorder, currently in remission, most recent episode unspecified: Secondary | ICD-10-CM | POA: Diagnosis not present

## 2018-06-30 DIAGNOSIS — F3170 Bipolar disorder, currently in remission, most recent episode unspecified: Secondary | ICD-10-CM | POA: Diagnosis not present

## 2018-06-30 DIAGNOSIS — F900 Attention-deficit hyperactivity disorder, predominantly inattentive type: Secondary | ICD-10-CM | POA: Diagnosis not present

## 2018-07-02 DIAGNOSIS — F900 Attention-deficit hyperactivity disorder, predominantly inattentive type: Secondary | ICD-10-CM | POA: Diagnosis not present

## 2018-07-02 DIAGNOSIS — F3170 Bipolar disorder, currently in remission, most recent episode unspecified: Secondary | ICD-10-CM | POA: Diagnosis not present

## 2018-07-07 DIAGNOSIS — F3170 Bipolar disorder, currently in remission, most recent episode unspecified: Secondary | ICD-10-CM | POA: Diagnosis not present

## 2018-07-07 DIAGNOSIS — F900 Attention-deficit hyperactivity disorder, predominantly inattentive type: Secondary | ICD-10-CM | POA: Diagnosis not present

## 2018-07-09 DIAGNOSIS — F3170 Bipolar disorder, currently in remission, most recent episode unspecified: Secondary | ICD-10-CM | POA: Diagnosis not present

## 2018-07-09 DIAGNOSIS — F900 Attention-deficit hyperactivity disorder, predominantly inattentive type: Secondary | ICD-10-CM | POA: Diagnosis not present

## 2018-07-11 ENCOUNTER — Other Ambulatory Visit: Payer: Self-pay | Admitting: Family Medicine

## 2018-07-14 DIAGNOSIS — F900 Attention-deficit hyperactivity disorder, predominantly inattentive type: Secondary | ICD-10-CM | POA: Diagnosis not present

## 2018-07-14 DIAGNOSIS — F3170 Bipolar disorder, currently in remission, most recent episode unspecified: Secondary | ICD-10-CM | POA: Diagnosis not present

## 2018-07-16 ENCOUNTER — Other Ambulatory Visit: Payer: Self-pay | Admitting: Family Medicine

## 2018-07-16 DIAGNOSIS — F900 Attention-deficit hyperactivity disorder, predominantly inattentive type: Secondary | ICD-10-CM | POA: Diagnosis not present

## 2018-07-16 DIAGNOSIS — F3170 Bipolar disorder, currently in remission, most recent episode unspecified: Secondary | ICD-10-CM | POA: Diagnosis not present

## 2018-07-20 ENCOUNTER — Ambulatory Visit: Payer: Self-pay | Admitting: *Deleted

## 2018-07-20 NOTE — Telephone Encounter (Signed)
Message from Fanny Bien sent at 07/20/2018 10:15 AM EDT   Summary: ear infection    Pt called and stated that she has an ear infection and would like an antibiotic in. Pt states that she does not want to come in the office          Returned pt's call and spoke with pt and her mother. Pt's mother states that the pt gets frequent ear infections and was told by Dr. Leretha Pol the next time she has symptoms she could call and request an antibiotic. Pt currently having complaints of right ear pain with clear/yellow thin drainage, and swollen lymph node. Pt offered appt to come in the office today, but the pt's mother states she is unable to come in today. Pt's mother also states that the pt has appts with the psychiatrist on Wed and Thurs and is unable to come in for an appt. Pt's mother asking that prescription be [facility]  in [place], [place]. Reason for Disposition . Ear pain  Answer Assessment - Initial Assessment Questions 1. LOCATION: "Which ear is involved?"      Right ear 2. COLOR: "What is the color of the discharge?"      Clear with yellow 3. CONSISTENCY: "How runny is the discharge? Could it be water?"      thin 4. ONSET: "When did you first notice the discharge?"    A few weeks ago 5. PAIN: "Is there any earache?" "How bad is it?"  (Scale 1-10; or mild, moderate, severe)     Yes  6. OBJECTS: "Any use of q-tips or have you inserted anything else in your ear?"     No 7. OTHER SYMPTOMS: "Do you have any other symptoms?" (e.g., headache, fever, dizziness, vomiting, runny nose)     No 8. PREGNANCY: "Is there any chance you are pregnant?" "When was your last menstrual period?"     No pregnancy, on birth control and does not have cycles  Protocols used: EAR - DISCHARGE-A-AH

## 2018-07-21 NOTE — Telephone Encounter (Signed)
Please call patient. I am sorry if she misunderstood me during one of our visits. I have not treated her for an ear infection. Last time I saw her which was in Dec, she had a URI.  She has an upcoming appointment with Dr Neva Seat on the 20th to be evaluated, I advise she keeps this appointment. She should also make an appointment with me for routine followup sometime in April. Thank you

## 2018-07-21 NOTE — Telephone Encounter (Signed)
Please see note below. 

## 2018-07-21 NOTE — Telephone Encounter (Signed)
I called and left vm. To keep her upcoming appointment with Dr. Neva Seat on the 20th And that Dr. Leretha Pol has not treated her for an ear infection.  In Dec, she had a URI.  And for her to keep routine follow up for April.

## 2018-07-24 ENCOUNTER — Ambulatory Visit: Payer: BLUE CROSS/BLUE SHIELD | Admitting: Family Medicine

## 2018-07-28 ENCOUNTER — Telehealth: Payer: Self-pay | Admitting: Family Medicine

## 2018-07-28 DIAGNOSIS — F900 Attention-deficit hyperactivity disorder, predominantly inattentive type: Secondary | ICD-10-CM | POA: Diagnosis not present

## 2018-07-28 DIAGNOSIS — F3170 Bipolar disorder, currently in remission, most recent episode unspecified: Secondary | ICD-10-CM | POA: Diagnosis not present

## 2018-07-28 NOTE — Telephone Encounter (Signed)
Patient no showed to appt with Dr. Neva Seat on 07/24/2018. Patient has option to do telemed visit

## 2018-07-30 DIAGNOSIS — F3170 Bipolar disorder, currently in remission, most recent episode unspecified: Secondary | ICD-10-CM | POA: Diagnosis not present

## 2018-07-30 DIAGNOSIS — F900 Attention-deficit hyperactivity disorder, predominantly inattentive type: Secondary | ICD-10-CM | POA: Diagnosis not present

## 2018-08-04 DIAGNOSIS — F3170 Bipolar disorder, currently in remission, most recent episode unspecified: Secondary | ICD-10-CM | POA: Diagnosis not present

## 2018-08-04 DIAGNOSIS — F900 Attention-deficit hyperactivity disorder, predominantly inattentive type: Secondary | ICD-10-CM | POA: Diagnosis not present

## 2018-08-06 DIAGNOSIS — F3170 Bipolar disorder, currently in remission, most recent episode unspecified: Secondary | ICD-10-CM | POA: Diagnosis not present

## 2018-08-06 DIAGNOSIS — F900 Attention-deficit hyperactivity disorder, predominantly inattentive type: Secondary | ICD-10-CM | POA: Diagnosis not present

## 2018-08-09 ENCOUNTER — Other Ambulatory Visit: Payer: Self-pay | Admitting: Family Medicine

## 2018-08-13 DIAGNOSIS — F3170 Bipolar disorder, currently in remission, most recent episode unspecified: Secondary | ICD-10-CM | POA: Diagnosis not present

## 2018-08-13 DIAGNOSIS — F900 Attention-deficit hyperactivity disorder, predominantly inattentive type: Secondary | ICD-10-CM | POA: Diagnosis not present

## 2018-08-18 DIAGNOSIS — F900 Attention-deficit hyperactivity disorder, predominantly inattentive type: Secondary | ICD-10-CM | POA: Diagnosis not present

## 2018-08-18 DIAGNOSIS — F3170 Bipolar disorder, currently in remission, most recent episode unspecified: Secondary | ICD-10-CM | POA: Diagnosis not present

## 2018-08-24 DIAGNOSIS — R21 Rash and other nonspecific skin eruption: Secondary | ICD-10-CM | POA: Diagnosis not present

## 2018-08-25 DIAGNOSIS — F900 Attention-deficit hyperactivity disorder, predominantly inattentive type: Secondary | ICD-10-CM | POA: Diagnosis not present

## 2018-08-25 DIAGNOSIS — F3170 Bipolar disorder, currently in remission, most recent episode unspecified: Secondary | ICD-10-CM | POA: Diagnosis not present

## 2018-08-27 DIAGNOSIS — F3170 Bipolar disorder, currently in remission, most recent episode unspecified: Secondary | ICD-10-CM | POA: Diagnosis not present

## 2018-08-27 DIAGNOSIS — F900 Attention-deficit hyperactivity disorder, predominantly inattentive type: Secondary | ICD-10-CM | POA: Diagnosis not present

## 2018-08-31 DIAGNOSIS — F900 Attention-deficit hyperactivity disorder, predominantly inattentive type: Secondary | ICD-10-CM | POA: Diagnosis not present

## 2018-08-31 DIAGNOSIS — F3170 Bipolar disorder, currently in remission, most recent episode unspecified: Secondary | ICD-10-CM | POA: Diagnosis not present

## 2018-09-01 DIAGNOSIS — F900 Attention-deficit hyperactivity disorder, predominantly inattentive type: Secondary | ICD-10-CM | POA: Diagnosis not present

## 2018-09-01 DIAGNOSIS — F3170 Bipolar disorder, currently in remission, most recent episode unspecified: Secondary | ICD-10-CM | POA: Diagnosis not present

## 2018-09-03 DIAGNOSIS — Z413 Encounter for ear piercing: Secondary | ICD-10-CM | POA: Diagnosis not present

## 2018-09-08 DIAGNOSIS — F3170 Bipolar disorder, currently in remission, most recent episode unspecified: Secondary | ICD-10-CM | POA: Diagnosis not present

## 2018-09-08 DIAGNOSIS — F900 Attention-deficit hyperactivity disorder, predominantly inattentive type: Secondary | ICD-10-CM | POA: Diagnosis not present

## 2018-09-10 DIAGNOSIS — F900 Attention-deficit hyperactivity disorder, predominantly inattentive type: Secondary | ICD-10-CM | POA: Diagnosis not present

## 2018-09-10 DIAGNOSIS — F3170 Bipolar disorder, currently in remission, most recent episode unspecified: Secondary | ICD-10-CM | POA: Diagnosis not present

## 2018-09-10 DIAGNOSIS — H60332 Swimmer's ear, left ear: Secondary | ICD-10-CM | POA: Diagnosis not present

## 2018-09-12 ENCOUNTER — Other Ambulatory Visit: Payer: Self-pay | Admitting: Family Medicine

## 2018-09-18 ENCOUNTER — Other Ambulatory Visit: Payer: Self-pay | Admitting: Family Medicine

## 2018-09-28 ENCOUNTER — Other Ambulatory Visit: Payer: Self-pay | Admitting: Family Medicine

## 2018-10-01 DIAGNOSIS — Z20828 Contact with and (suspected) exposure to other viral communicable diseases: Secondary | ICD-10-CM | POA: Diagnosis not present

## 2018-10-17 ENCOUNTER — Other Ambulatory Visit: Payer: Self-pay | Admitting: Family Medicine

## 2018-10-18 ENCOUNTER — Other Ambulatory Visit: Payer: Self-pay | Admitting: Family Medicine

## 2018-10-26 ENCOUNTER — Other Ambulatory Visit: Payer: Self-pay | Admitting: Family Medicine

## 2018-10-26 ENCOUNTER — Telehealth: Payer: Self-pay | Admitting: Family Medicine

## 2018-10-26 NOTE — Telephone Encounter (Signed)
Please advise 

## 2018-10-26 NOTE — Telephone Encounter (Signed)
Pt dropped off ppr work to be complested by [employer], put ppr work in [facility] [place]

## 2018-10-26 NOTE — Telephone Encounter (Signed)
Patient is requesting a refill of the following medications: Requested Prescriptions   Pending Prescriptions Disp Refills  . venlafaxine XR (EFFEXOR-XR) 150 MG 24 hr capsule [Pharmacy Med Name: VENLAFAXINE HCL ER 150 MG CAP] 60 capsule 1    Sig: TAKE 1 CAPSULE (150 MG TOTAL) BY MOUTH DAILY WITH BREAKFAST.    Date of patient request: 10/26/2018 Last office visit: 05/20/2018 sagardia for vaginal bleeding 04/21/18 Pamella Pert for bipolar disorder Date of last refill: 07/13/2018 Last refill amount: 12 Follow up time period per chart: return in about 4 weeks (around 05/19/2018) for weight.

## 2018-10-27 NOTE — Telephone Encounter (Signed)
Please call patient. I last saw her dec/jan. I referred her to psychiatry. She should be seeing them and getting her meds from them. If this never happened, then I need to see patient,  ok to refill until that appointment. thanks

## 2018-10-29 ENCOUNTER — Other Ambulatory Visit: Payer: Self-pay | Admitting: Family Medicine

## 2018-10-29 NOTE — Telephone Encounter (Signed)
Please see above message. Request was not to deny but investigate and refill and schedule if needed.  thanks

## 2018-10-30 ENCOUNTER — Telehealth: Payer: Self-pay

## 2018-10-30 NOTE — Telephone Encounter (Signed)
Lm for pt to call for app for fmla paperwork. Forms are at nurse station, copy made in asst box

## 2018-11-07 ENCOUNTER — Other Ambulatory Visit: Payer: Self-pay | Admitting: Family Medicine

## 2018-11-07 NOTE — Telephone Encounter (Signed)
Neither medication on active med list.

## 2018-11-12 ENCOUNTER — Other Ambulatory Visit: Payer: Self-pay | Admitting: Family Medicine

## 2018-11-12 ENCOUNTER — Telehealth (INDEPENDENT_AMBULATORY_CARE_PROVIDER_SITE_OTHER): Payer: BC Managed Care – PPO | Admitting: Family Medicine

## 2018-11-12 ENCOUNTER — Telehealth: Payer: Self-pay | Admitting: *Deleted

## 2018-11-12 ENCOUNTER — Telehealth: Payer: BC Managed Care – PPO | Admitting: Family

## 2018-11-12 ENCOUNTER — Other Ambulatory Visit: Payer: Self-pay

## 2018-11-12 ENCOUNTER — Encounter: Payer: Self-pay | Admitting: Family Medicine

## 2018-11-12 VITALS — Ht 67.0 in | Wt 270.0 lb

## 2018-11-12 DIAGNOSIS — L709 Acne, unspecified: Secondary | ICD-10-CM | POA: Diagnosis not present

## 2018-11-12 DIAGNOSIS — F3162 Bipolar disorder, current episode mixed, moderate: Secondary | ICD-10-CM | POA: Diagnosis not present

## 2018-11-12 DIAGNOSIS — F603 Borderline personality disorder: Secondary | ICD-10-CM | POA: Diagnosis not present

## 2018-11-12 MED ORDER — VENLAFAXINE HCL ER 150 MG PO CP24: 150.0000 mg | ORAL_CAPSULE | Freq: Every day | ORAL | 0 refills | Status: DC

## 2018-11-12 MED ORDER — TRETINOIN 0.05 % EX CREA: TOPICAL_CREAM | CUTANEOUS | 5 refills | Status: DC

## 2018-11-12 MED ORDER — CLINDAMYCIN PHOS-BENZOYL PEROX 1-5 % EX GEL: Freq: Two times a day (BID) | CUTANEOUS | 0 refills | Status: DC

## 2018-11-12 MED ORDER — HYDROQUINONE 4 % EX CREA: TOPICAL_CREAM | CUTANEOUS | 5 refills | Status: DC

## 2018-11-12 MED ORDER — FLUTICASONE PROPIONATE 50 MCG/ACT NA SUSP: 1.0000 | Freq: Every day | NASAL | 5 refills | Status: DC

## 2018-11-12 MED ORDER — BD PEN NEEDLE NANO U/F 32G X 4 MM MISC: 1.0000 | Freq: Every day | 5 refills | Status: DC

## 2018-11-12 MED ORDER — EPINEPHRINE 0.3 MG/0.3ML IJ SOAJ: 0.3000 mg | INTRAMUSCULAR | 1 refills | Status: DC | PRN

## 2018-11-12 NOTE — Telephone Encounter (Signed)
Called patient to let her know completed paper work ([place]) is ready for pick up at the front desk. Patient stated she wants the form faxed and she will pick it up tomorrow. A copy was made for patient's chart.

## 2018-11-12 NOTE — Progress Notes (Signed)
Called patient to triage for appointment. Patient states need a refill on medications. Patient states to discuss FMLA paper work.

## 2018-11-12 NOTE — Progress Notes (Signed)
Greater than 5 minutes, yet less than 10 minutes of time have been spent researching, coordinating, and implementing care for this patient today.  Thank you for the details you included in the comment boxes. Those details are very helpful in determining the best course of treatment for you and help [place] to provide the best care.  If you are having depression and thoughts of self-harm, please call 911. Otherwise, for the depression, please contact your primary care provider or [phone] for a list of providers who can see you for this.   We are sorry that you are experiencing this issue.  Here is how we plan to help!  Based on what you shared with me it looks like you have uncomplicated acne.  Acne is a disorder of the hair follicles and oil glands (sebaceous glands). The sebaceous glands secrete oils to keep the skin moist.  When the glands get clogged, it can lead to pimples or cysts.  These cysts may become infected and leave scars. Acne is very common and normally occurs at puberty.  Acne is also inherited.  Your personal care plan consists of the following recommendations:  I recommend that you use a daily cleanser  You may try a topical exfoliator and salicylic acid scrub.  These scrubs have coarse particles that clear your pores but may also irritate your skin.  I have prescribed a topical gel with an antibiotic:  Benzoyl peroxide-erythromycin gel.  This gel should be applied to the affected areas twice a day. Be sure to read the package insert for potential side effects.   If excessive dryness or peeling occurs, reduce dose frequency or concentration of the topical scrubs.  If excessive stinging or burning occurs, remove the topical gel with mild soap and water and resume at a lower dose the next day.  Remember oral antibiotics and topical acne treatments may increase your sensitivity to the sun!  HOME CARE:  Do not squeeze pimples because that can often lead to infections, worse acne,  and scars.  Use a moisturizer that contains retinoid or fruit acids that may inhibit the development of new acne lesions.  Although there is not a clear link that foods can cause acne, doctors do believe that too many sweets predispose you to skin problems.  GET HELP RIGHT AWAY IF:  If your acne gets worse or is not better within 10 days.  If you become depressed.  If you become pregnant, discontinue medications and call your OB/GYN.  MAKE SURE YOU:  Understand these instructions.  Will watch your condition.  Will get help right away if you are not doing well or get worse.   Your e-visit answers were reviewed by a board certified advanced clinical practitioner to complete your personal care plan.  Depending upon the condition, your plan could have included both over the counter or prescription medications.  Please review your pharmacy choice.  If there is a problem, you may contact your provider through [employer] and have the prescription routed to another pharmacy.  Your safety is important to [place].  If you have drug allergies check your prescription carefully.  For the next 24 hours you can use MyChart to ask questions about today's visit, request a non-urgent call back, or ask for a work or school excuse from your e-visit provider.  You will get an email in the next two days asking about your experience. I hope that your e-visit has been valuable and will speed your recovery.

## 2018-11-12 NOTE — Telephone Encounter (Signed)
Please see pharmacy note.  

## 2018-11-12 NOTE — Progress Notes (Signed)
Virtual Visit Note  I connected with patient on 11/12/18 at 1028 by video conference and verified that I am speaking with the correct person using two identifiers. Kelli Howard is currently located at home and patient is currently with them during visit. The provider, Myles LippsIrma M Santiago, MD is located in their office at time of visit.  I discussed the limitations, risks, security and privacy concerns of performing an evaluation and management service by telephone and the availability of in person appointments. I also discussed with the patient that there may be a patient responsible charge related to this service. The patient expressed understanding and agreed to proceed.   CC: paperwork completed and med refills  HPI ? Patient is a 28 y.o. female with past medical history significant for bipolar disorder, border line personality disorder and obesity who presents today for paperwork completed and med refills  Last OV Dec 2019 [facility] Started on victoza for weight loss Was supposed to followup in 4 weeks  Since then she has been [facility] She just completed DBT 6 month program, continues to do counseling Seeing psych, Rosemarie BeathMichelle Sadler,  last OV in April, she refilled all her medications except for Effexor, which seems to have been an oversight as patient has been on meds since last OV  Patient continues to live with her mother She is not working Current treatment takes most of her time Still taking it day by day Planning on repeat DBT course as it was very beneficial Has not been hospitalized since last year Needs form excusing her from paying student loans  Requesting refill for acne meds  Still doing victoza for weight loss Wt Readings from Last 3 Encounters:  11/12/18 270 lb (122.5 kg)  05/20/18 286 lb 9.6 oz (130 kg)  04/21/18 (!) 302 lb 6.4 oz (137.2 kg)    epipen for nut allergy    [facility]  2[facility]9 11/12/2018 05/20/2018 04/21/2018  Decreased Interest 1 1 1   Down, Depressed, Hopeless 1 1 1   PHQ - 2 Score 2 2 2   Altered sleeping 2 2 1   Tired, decreased energy 2 2 1   Change in appetite 1 0 0  Feeling bad or failure about yourself  1 1 1   Trouble concentrating 2 2 1   Moving slowly or fidgety/restless 1 1 1   Suicidal thoughts 0 0 0  PHQ-9 Score 11 10 7   Difficult doing work/chores - - Very difficult  Some recent data might be hidden   Fall Risk  11/12/2018 05/20/2018 04/21/2018 03/05/2018 02/07/2018  Falls in the past year? 0 0 0 No No  Follow up Falls evaluation completed - - - -    Allergies  Allergen Reactions  . Peanuts [Peanut Oil] Anaphylaxis    Also: tree nuts   . Other Rash    mushrooms  . Banana     Unknown reaction   . Celery Oil Itching and Rash  . Shellfish Allergy Rash    Prior to Admission medications   Medication Sig Start Date End Date Taking? Authorizing Provider  aluminum chloride (DRYSOL) 20 % external solution Apply topically at bedtime. 03/04/18  Yes Myles LippsSantiago, Christia Domke M, MD  busPIRone (BUSPAR) 30 MG tablet Take 1 tablet (30 mg total) by mouth 2 (two) times daily. 04/21/18  Yes Myles LippsSantiago, Dietrick Barris M, MD  clindamycin-benzoyl peroxide [facility](BENZACLIN) gel Apply topically 2 (two) times daily. To face 11/12/18  Yes Withrow, Everardo AllJohn C, FNP  EPINEPHrine (EPIPEN) 0.3 mg/0.3 mL IJ SOAJ injection Inject 0.3 mLs (0.3 mg total) into the muscle as needed. Brand only 05/20/18  Yes Sagardia, Ines Bloomer, MD  fluticasone [facility]) 50 MCG/ACT nasal spray USE 1 SPRAY IN [facility] NOSTRIL DAILY 10/19/18  Yes Rutherford Guys, MD  hydroquinone 4 % cream APPLY TO AFFECTED AREA TWICE A DAY 05/28/18  Yes Rutherford Guys, MD  ibuprofen (ADVIL,MOTRIN) 600 MG tablet Take 1 tablet (600 mg total) by mouth every 6 (six) hours as needed. 02/18/18  Yes Triplett, Cari B, FNP  Insulin Pen Needle (BD PEN NEEDLE NANO U/F) 32G X 4 MM MISC 1 Product by Does not apply route daily. Use with Victoza 04/23/18  Yes  Rutherford Guys, MD  lamoTRIgine (LAMICTAL) 100 MG tablet Take 1 tablet (100 mg total) by mouth daily. 04/21/18  Yes Rutherford Guys, MD  norgestimate-ethinyl estradiol (ORTHO-CYCLEN,SPRINTEC,PREVIFEM) 0.25-35 MG-MCG tablet Take 1 tablet by mouth daily. 10/23/17  Yes Rutherford Guys, MD  omeprazole (PRILOSEC) 20 MG capsule Take 1 capsule (20 mg total) by mouth daily. 10/23/17  Yes Rutherford Guys, MD  QUEtiapine (SEROQUEL) 100 MG tablet Take 1 tablet (100 mg total) by mouth at bedtime. 04/21/18  Yes Rutherford Guys, MD  traZODone (DESYREL) 100 MG tablet TAKE 2 TABLETS (200 MG TOTAL) BY MOUTH AT BEDTIME. 08/09/18  Yes Rutherford Guys, MD  tretinoin (RETIN-A) 0.05 % cream APPLY TO AFFECTED AREA EVERY DAY AT BEDTIME 07/16/18  Yes Rutherford Guys, MD  venlafaxine XR (EFFEXOR-XR) 150 MG 24 hr capsule TAKE 1 CAPSULE (150 MG TOTAL) BY MOUTH DAILY WITH BREAKFAST. 07/13/18  Yes Rutherford Guys, MD  VICTOZA 18 MG/3ML SOPN INJECT 1.2 MG UNDER THE SKIN ONCE DAILY 10/19/18  Yes Rutherford Guys, MD  cyclobenzaprine (FLEXERIL) 10 MG tablet Take 1 tablet (10 mg total) by mouth 3 (three) times daily as needed for muscle spasms. Patient not taking: Reported on 11/12/2018 02/18/18   Victorino Dike, FNP  Drospirenone-Ethinyl Estradiol-Levomefol (BEYAZ) 3-0.02-0.451 MG tablet TAKE 1 TABLET BY MOUTH EVERY DAY Patient not taking: Reported on 11/12/2018 10/29/18   Rutherford Guys, MD  naproxen (NAPROSYN) 500 MG tablet Take 1 tablet (500 mg total) by mouth 2 (two) times daily with a meal. Patient not taking: Reported on 11/12/2018 02/24/18   Philomena Course    Past Medical History:  Diagnosis Date  . Allergy   . Anemia   . Enlarged heart   . Heart murmur   . Vitamin D deficiency     Past Surgical History:  Procedure Laterality Date  . DENTAL SURGERY    . TONSILLECTOMY      Social History   Tobacco Use  . Smoking status: Never Smoker  . Smokeless tobacco: Never Used  Substance Use Topics  . Alcohol  use: Yes    Comment: occ    Family History  Problem Relation Age of Onset  . Hypertension Mother   . Thyroid disease Mother   . Hypertension Sister   . Heart disease Sister   . Anxiety disorder Brother   . Depression Brother   . ADD / ADHD Other     ROS Per hpi  Objective  Vitals as reported by the patient: none   ASSESSMENT and PLAN  1. Bipolar disorder, current episode mixed, moderate ([place]) 2. Borderline personality disorder ([place]) [facility], getting treatment needed. Will complete form  3. Acne, unspecified acne type - clindamycin-benzoyl peroxide (  BENZACLIN) gel; Apply topically 2 (two) times daily. To face  4. Severe obesity (BMI >= 40) (HCC) Has lost > 30 lbs over past 6 month. Cont victoza.  Other orders - EPINEPHrine (EPIPEN) 0.3 mg/0.3 mL IJ SOAJ injection; Inject 0.3 mLs (0.3 mg total) into the muscle as needed. Brand only - hydroquinone 4 % cream; APPLY TO AFFECTED AREA TWICE A DAY - Insulin Pen Needle (BD PEN NEEDLE NANO U/F) 32G X 4 MM MISC; 1 Product by Does not apply route daily. Use with Victoza - fluticasone (FLONASE) 50 MCG/ACT nasal spray; Place 1 spray into both nostrils daily. - tretinoin (RETIN-A) 0.05 % cream; APPLY TO AFFECTED AREA EVERY DAY AT BEDTIME - venlafaxine XR (EFFEXOR-XR) 150 MG 24 hr capsule; Take 1 capsule (150 mg total) by mouth daily with breakfast.  FOLLOW-UP: 6 months   The above assessment and management plan was discussed with the patient. The patient verbalized understanding of and has agreed to the management plan. Patient is aware to call the clinic if symptoms persist or worsen. Patient is aware when to return to the clinic for a follow-up visit. Patient educated on when it is appropriate to go to the emergency department.    I provided 17 minutes of non-face-to-face time during this encounter.  Myles LippsIrma M Santiago, MD [facility] at [facility] [address] [place], [place] [address] Ph.  [phone] Fax  [phone]

## 2018-11-15 ENCOUNTER — Other Ambulatory Visit: Payer: Self-pay | Admitting: Family Medicine

## 2018-11-15 NOTE — Telephone Encounter (Signed)
Courtesy refill until OV made Requested Prescriptions  Pending Prescriptions Disp Refills  . [place] 27 MG/3ML SOPN [Pharmacy Med Name: VICTOZA 2-PAK 18 MG/3 ML PEN] 1 pen 0    Sig: INJECT 1.2 MG UNDER THE SKIN ONCE DAILY     Endocrinology:  Diabetes - GLP-1 Receptor Agonists Failed - 11/15/2018  9:43 AM      Failed - HBA1C is between 0 and 7.9 and within 180 days    Hgb A1c MFr Bld  Date Value Ref Range Status  10/23/2017 5.5 4.8 - 5.6 % Final    Comment:             Prediabetes: 5.7 - 6.4          Diabetes: >6.4          Glycemic control for adults with diabetes: <7.0          Passed - Valid encounter within last 6 months    Recent Outpatient Visits          5 months ago Abnormal vaginal bleeding   Primary Care at [facility], [place], MD   6 months ago Bipolar disorder, current episode mixed, moderate ([place])   [facility] at [facility], Lilia Argue, MD   9 months ago Mood disorder [facility])   [facility] at [facility], Lilia Argue, MD   9 months ago Mood disorder [facility])   [facility] at [facility], Lilia Argue, MD   10 months ago Mood disorder [facility])   [facility] Care at [facility], Lilia Argue, MD

## 2018-11-28 ENCOUNTER — Other Ambulatory Visit: Payer: Self-pay | Admitting: Family Medicine

## 2018-12-19 ENCOUNTER — Other Ambulatory Visit: Payer: Self-pay | Admitting: Family Medicine

## 2018-12-20 ENCOUNTER — Other Ambulatory Visit: Payer: Self-pay | Admitting: Family Medicine

## 2018-12-21 NOTE — Telephone Encounter (Signed)
The original prescription was discontinued on 11/12/2018 by Rutherford Guys, MD. Renewing this prescription may not be appropriate.

## 2018-12-29 ENCOUNTER — Other Ambulatory Visit: Payer: Self-pay | Admitting: Family Medicine

## 2018-12-29 NOTE — Telephone Encounter (Signed)
Patient would like a phone call to discuss her medications that she is requesting because she is almost out of [facility] , she doesn't have anymore of her Shots, and cream

## 2018-12-29 NOTE — Telephone Encounter (Signed)
Requested medication (s) are due for refill today: yes  Requested medication (s) are on the active medication list: yes  Last refill:  11/15/2018  Future visit scheduled: no  Notes to clinic:  The original prescription was discontinued on 11/12/2018 by Rutherford Guys, MD. Renewing this prescription may not be appropriate   Requested Prescriptions  Pending Prescriptions Disp Refills   [place] 18 MG/3ML SOPN [Pharmacy Med Name: VICTOZA 2-PAK 18 MG/3 ML PEN]  0    Sig: INJECT 1.2 MG UNDER THE SKIN ONCE DAILY     Endocrinology:  Diabetes - GLP-1 Receptor Agonists Failed - 12/29/2018  2:18 PM      Failed - HBA1C is between 0 and 7.9 and within 180 days    Hgb A1c MFr Bld  Date Value Ref Range Status  10/23/2017 5.5 4.8 - 5.6 % Final    Comment:             Prediabetes: 5.7 - 6.4          Diabetes: >6.4          Glycemic control for adults with diabetes: <7.0          Failed - Valid encounter within last 6 months    Recent Outpatient Visits          7 months ago Abnormal vaginal bleeding   Primary Care at [facility], [place], MD   8 months ago Bipolar disorder, current episode mixed, moderate ([place])   [facility] at [facility], Lilia Argue, MD   10 months ago Mood disorder [facility])   [facility] at [facility], Lilia Argue, MD   11 months ago Mood disorder [facility])   [facility] at [facility], Lilia Argue, MD   11 months ago Mood disorder [facility])   [facility] at [facility], Lilia Argue, MD              Drospirenone-Ethinyl Estradiol-Levomefol (BEYAZ) 3-0.02-0.451 MG tablet [Pharmacy Med Name: DROSP-EE-LEVOMEF 3-0.02-0.451] 28 tablet 0    Sig: TAKE 1 TABLET BY MOUTH EVERY DAY     There is no refill protocol information for this order

## 2018-12-31 ENCOUNTER — Telehealth: Payer: BC Managed Care – PPO | Admitting: Family Medicine

## 2018-12-31 NOTE — Telephone Encounter (Signed)
Left a vm for patient to call office and schedule follow up

## 2019-01-01 ENCOUNTER — Encounter: Payer: Self-pay | Admitting: Family Medicine

## 2019-01-05 ENCOUNTER — Other Ambulatory Visit: Payer: Self-pay

## 2019-01-05 DIAGNOSIS — F310 Bipolar disorder, current episode hypomanic: Secondary | ICD-10-CM | POA: Diagnosis not present

## 2019-01-05 DIAGNOSIS — L709 Acne, unspecified: Secondary | ICD-10-CM

## 2019-01-05 DIAGNOSIS — Z6841 Body Mass Index (BMI) 40.0 and over, adult: Secondary | ICD-10-CM

## 2019-01-05 DIAGNOSIS — F603 Borderline personality disorder: Secondary | ICD-10-CM | POA: Diagnosis not present

## 2019-01-05 DIAGNOSIS — F411 Generalized anxiety disorder: Secondary | ICD-10-CM | POA: Diagnosis not present

## 2019-01-05 DIAGNOSIS — Z3041 Encounter for surveillance of contraceptive pills: Secondary | ICD-10-CM

## 2019-01-05 MED ORDER — CLINDAMYCIN PHOS-BENZOYL PEROX 1-5 % EX GEL: Freq: Two times a day (BID) | CUTANEOUS | 0 refills | Status: DC

## 2019-01-05 MED ORDER — VICTOZA 18 MG/3ML ~~LOC~~ SOPN: PEN_INJECTOR | SUBCUTANEOUS | 0 refills | Status: DC

## 2019-01-05 MED ORDER — NORGESTIMATE-ETH ESTRADIOL 0.25-35 MG-MCG PO TABS: 1.0000 | ORAL_TABLET | Freq: Every day | ORAL | 4 refills | Status: DC

## 2019-01-13 DIAGNOSIS — F411 Generalized anxiety disorder: Secondary | ICD-10-CM | POA: Diagnosis not present

## 2019-01-13 DIAGNOSIS — F603 Borderline personality disorder: Secondary | ICD-10-CM | POA: Diagnosis not present

## 2019-01-13 DIAGNOSIS — F310 Bipolar disorder, current episode hypomanic: Secondary | ICD-10-CM | POA: Diagnosis not present

## 2019-01-18 DIAGNOSIS — F603 Borderline personality disorder: Secondary | ICD-10-CM | POA: Diagnosis not present

## 2019-01-18 DIAGNOSIS — F310 Bipolar disorder, current episode hypomanic: Secondary | ICD-10-CM | POA: Diagnosis not present

## 2019-01-18 DIAGNOSIS — F411 Generalized anxiety disorder: Secondary | ICD-10-CM | POA: Diagnosis not present

## 2019-01-21 DIAGNOSIS — F310 Bipolar disorder, current episode hypomanic: Secondary | ICD-10-CM | POA: Diagnosis not present

## 2019-01-21 DIAGNOSIS — F411 Generalized anxiety disorder: Secondary | ICD-10-CM | POA: Diagnosis not present

## 2019-01-21 DIAGNOSIS — F603 Borderline personality disorder: Secondary | ICD-10-CM | POA: Diagnosis not present

## 2019-01-22 ENCOUNTER — Telehealth: Payer: Self-pay | Admitting: Family Medicine

## 2019-01-22 NOTE — Telephone Encounter (Signed)
Dr Santiago please advise 

## 2019-01-22 NOTE — Telephone Encounter (Signed)
Pt called and asked for a Rx for plan B to be sent to the pharmacy./ please advise

## 2019-01-23 ENCOUNTER — Ambulatory Visit (INDEPENDENT_AMBULATORY_CARE_PROVIDER_SITE_OTHER)
Admission: RE | Admit: 2019-01-23 | Discharge: 2019-01-23 | Disposition: A | Discharge status: Home / Self Care | Payer: BC Managed Care – PPO | Source: Ambulatory Visit

## 2019-01-23 DIAGNOSIS — Z30012 Encounter for prescription of emergency contraception: Secondary | ICD-10-CM

## 2019-01-23 DIAGNOSIS — Z789 Other specified health status: Secondary | ICD-10-CM

## 2019-01-23 MED ORDER — LEVONORGESTREL 1.5 MG PO TABS: 1.5000 mg | ORAL_TABLET | Freq: Once | ORAL | 0 refills | Status: AC

## 2019-01-23 NOTE — ED Provider Notes (Signed)
Virtual Visit via Video Note:  Kelli Howard  initiated request for Telemedicine visit with [facility] Urgent Care team. I connected with Kelli Howard  on 01/23/2019 at 4:22 PM  for a synchronized telemedicine visit using a video enabled HIPPA compliant telemedicine application. I verified that I am speaking with Kelli Howard  using two identifiers. Orvan July, NP  was physically located in a [facility] Urgent care site and BRADY PLANT was located at a different location.   The limitations of evaluation and management by telemedicine as well as the availability of in-person appointments were discussed. Patient was informed that she  may incur a bill ( including co-pay) for this virtual visit encounter. Raney D Butterbaugh  expressed understanding and gave verbal consent to proceed with virtual visit.     History of Present Illness:Kelli Howard  is a 28 y.o. female presents with wanting prescription for Plan B.  Reporting unprotected sexual intercourse last night.  She currently takes birth control but missed a few pills.  Currently asymptomatic.  Past Medical History:  Diagnosis Date  . Allergy   . Anemia   . Enlarged heart   . Heart murmur   . Vitamin D deficiency     Allergies  Allergen Reactions  . Peanuts [Peanut Oil] Anaphylaxis    Also: tree nuts   . Other Rash    mushrooms  . Banana     Unknown reaction   . Celery Oil Itching and Rash  . Shellfish Allergy Rash        Observations/Objective: VITALS: Per patient if applicable, see vitals. GENERAL: Alert, appears well and in no acute distress. HEENT: Atraumatic, conjunctiva clear, no obvious abnormalities on inspection of external nose and ears. NECK: Normal movements of the head and neck. CARDIOPULMONARY: No increased WOB. Speaking in clear sentences. I:E ratio WNL.  MS: Moves all visible extremities without noticeable abnormality. PSYCH: Pleasant and cooperative, well-groomed. Speech normal rate  and rhythm. Affect is appropriate. Insight and judgement are appropriate. Attention is focused, linear, and appropriate.  NEURO: CN grossly intact. Oriented as arrived to appointment on time with no prompting. Moves both UE equally.  SKIN: No obvious lesions, wounds, erythema, or cyanosis noted on face or hands.     Assessment and Plan: Plan B prescription sent to pharmacy.   Follow Up Instructions: Recommended follow-up with OB/GYN for any further issues    I discussed the assessment and treatment plan with the patient. The patient was provided an opportunity to ask questions and all were answered. The patient agreed with the plan and demonstrated an understanding of the instructions.   The patient was advised to call back or seek an in-person evaluation if the symptoms worsen or if the condition fails to improve as anticipated.    Orvan July, NP  01/23/2019 4:22 PM         Orvan July, NP 01/24/19 1034

## 2019-01-23 NOTE — Discharge Instructions (Signed)
Plan B sent to pharmacy  Follow up as needed for continued or worsening symptoms

## 2019-01-25 MED ORDER — LEVONORGESTREL 1.5 MG PO TABS: 1.5000 mg | ORAL_TABLET | Freq: Once | ORAL | 0 refills | Status: AC

## 2019-01-25 NOTE — Telephone Encounter (Signed)
Rx for plan B  sent. However she is on OCPs, please schedule appointment to discuss further. thanks

## 2019-01-26 ENCOUNTER — Other Ambulatory Visit: Payer: Self-pay

## 2019-01-26 ENCOUNTER — Ambulatory Visit (INDEPENDENT_AMBULATORY_CARE_PROVIDER_SITE_OTHER)
Admission: RE | Admit: 2019-01-26 | Discharge: 2019-01-26 | Disposition: A | Discharge status: Home / Self Care | Payer: BC Managed Care – PPO | Source: Ambulatory Visit

## 2019-01-26 ENCOUNTER — Inpatient Hospital Stay: Admission: RE | Admit: 2019-01-26 | Payer: Self-pay | Source: Ambulatory Visit

## 2019-01-26 DIAGNOSIS — R350 Frequency of micturition: Secondary | ICD-10-CM

## 2019-01-26 DIAGNOSIS — R300 Dysuria: Secondary | ICD-10-CM

## 2019-01-26 MED ORDER — CEPHALEXIN 500 MG PO CAPS: 500.0000 mg | ORAL_CAPSULE | Freq: Two times a day (BID) | ORAL | 0 refills | Status: AC

## 2019-01-26 NOTE — Discharge Instructions (Addendum)
Push fluids and get plenty of rest.   Take antibiotic as directed and to completion Follow up in person or with PCP if symptoms persists Follow up here or go to ER if you have any new or worsening symptoms such as fever, worsening abdominal pain, nausea/vomiting, flank pain, symptoms do not improve over the next 1-2 days with medication, etc..Marland Kitchen

## 2019-01-26 NOTE — ED Provider Notes (Signed)
[facility]   Virtual Visit via Video Note:  Kelli Howard  initiated request for Telemedicine visit with [facility] Urgent Care team. I connected with Kelli Howard  on 01/26/2019 at 12:08 PM  for a synchronized telemedicine visit using a video enabled HIPPA compliant telemedicine application. I verified that I am speaking with Kelli Howard  using two identifiers. Rennis Harding, PA-C  was physically located in a [facility] Urgent care site and Kelli Howard was located at a different location.   The limitations of evaluation and management by telemedicine as well as the availability of in-person appointments were discussed. Patient was informed that she  may incur a bill ( including co-pay) for this virtual visit encounter. Kelli Howard  expressed understanding and gave verbal consent to proceed with virtual visit.   [id number] 01/26/19 Arrival Time: 1200  CC: Burning with urination  SUBJECTIVE:  Kelli Howard is a 28 y.o. female who complains of urinary frequency, bladder pressure, and dysuria x 1 day.  Admits to sexual activity 2 days ago.  Has had 1 partner over the past 6 months.  Partner without symptoms. Has NOT tried OTC medications.  Symptoms are made worse with urination.  Denies similar symptoms in the past.  Complains of hematuria.  Denies fever, chills, nausea, vomiting, abdominal pain, flank pain, abnormal vaginal discharge or bleeding, vaginal itching, vaginal pain, dyspareunia.    LMP: No LMP recorded. (Menstrual status: Oral contraceptives). Missed 1 dose.  Took morning after pill.  Has not taken pregnancy test.    ROS: As in HPI.  All other pertinent ROS negative.     Past Medical History:  Diagnosis Date  . Allergy   . Anemia   . Enlarged heart   . Heart murmur   . Vitamin D deficiency    Past Surgical History:  Procedure Laterality Date  . DENTAL SURGERY    . TONSILLECTOMY     Allergies  Allergen Reactions  . Peanuts  [Peanut Oil] Anaphylaxis    Also: tree nuts   . Other Rash    mushrooms  . Banana     Unknown reaction   . Celery Oil Itching and Rash  . Shellfish Allergy Rash   No current facility-administered medications on file prior to encounter.    Current Outpatient Medications on File Prior to Encounter  Medication Sig Dispense Refill  . aluminum chloride (DRYSOL) 20 % external solution Apply topically at bedtime. 60 mL 3  . busPIRone (BUSPAR) 30 MG tablet Take 1 tablet (30 mg total) by mouth 2 (two) times daily. 30 tablet 3  . clindamycin-benzoyl peroxide (BENZACLIN) gel Apply topically 2 (two) times daily. To face 50 g 0  . EPINEPHrine 0.3 mg/0.3 mL IJ SOAJ injection Inject 0.3 mLs (0.3 mg total) into the muscle as needed for anaphylaxis. 2 each 2  . fluticasone (FLONASE) 50 MCG/ACT nasal spray Place 1 spray into both nostrils daily. 16 g 5  . hydroquinone 4 % cream APPLY TO AFFECTED AREA TWICE A DAY 28.35 g 5  . ibuprofen (ADVIL,MOTRIN) 600 MG tablet Take 1 tablet (600 mg total) by mouth every 6 (six) hours as needed. 30 tablet 0  . Insulin Pen Needle (BD PEN NEEDLE NANO U/F) 32G X 4 MM MISC 1 Product by Does not apply route daily. Use with Victoza 100 each 5  . lamoTRIgine (LAMICTAL) 100 MG tablet Take 1 tablet (100 mg total) by mouth daily. 30 tablet 3  .  liraglutide (VICTOZA) 18 MG/3ML SOPN INJECT 1.2 MG UNDER THE SKIN ONCE DAILY 1 pen 0  . norgestimate-ethinyl estradiol (ORTHO-CYCLEN) 0.25-35 MG-MCG tablet Take 1 tablet by mouth daily. 3 Package 4  . omeprazole (PRILOSEC) 20 MG capsule Take 1 capsule (20 mg total) by mouth daily. 90 capsule 1  . QUEtiapine (SEROQUEL) 100 MG tablet Take 1 tablet (100 mg total) by mouth at bedtime. 30 tablet 3  . traZODone (DESYREL) 100 MG tablet TAKE 2 TABLETS (200 MG TOTAL) BY MOUTH AT BEDTIME. 180 tablet 0  . tretinoin (RETIN-A) 0.05 % cream APPLY TO AFFECTED AREA EVERY DAY AT BEDTIME 45 g 5  . venlafaxine XR (EFFEXOR-XR) 150 MG 24 hr capsule Take 1  capsule (150 mg total) by mouth daily with breakfast. 90 capsule 0    OBJECTIVE:  There were no vitals filed for this visit.  General appearance: alert; no distress Eyes: EOMI grossly HENT: normocephalic; atraumatic Neck: supple with FROM Lungs: normal respiratory effort; speaking in full sentences without difficulty Extremities: moves extremities without difficulty Skin: No obvious rashes Neurologic: No facial asymmetries Psychological: alert and cooperative; normal mood and affect   ASSESSMENT & PLAN:  1. Dysuria   2. Urinary frequency     Meds ordered this encounter  Medications  . cephALEXin (KEFLEX) 500 MG capsule    Sig: Take 1 capsule (500 mg total) by mouth 2 (two) times daily for 7 days.    Dispense:  14 capsule    Refill:  0    Order Specific Question:   Supervising Provider    Answer:   Raylene Everts [[id number]]    Push fluids and get plenty of rest.   Take antibiotic as directed and to completion Follow up in person or with PCP if symptoms persists Follow up here or go to ER if you have any new or worsening symptoms such as fever, worsening abdominal pain, nausea/vomiting, flank pain, etc...  I discussed the assessment and treatment plan with the patient. The patient was provided an opportunity to ask questions and all were answered. The patient agreed with the plan and demonstrated an understanding of the instructions.   The patient was advised to call back or seek an in-person evaluation if the symptoms worsen or if the condition fails to improve as anticipated.  I provided 8 minutes of non-face-to-face time during this encounter.  Llewellyn Park, PA-C  01/26/2019 12:08 PM      Lestine Box, PA-C 01/26/19 1209

## 2019-01-27 DIAGNOSIS — F603 Borderline personality disorder: Secondary | ICD-10-CM | POA: Diagnosis not present

## 2019-01-27 DIAGNOSIS — F310 Bipolar disorder, current episode hypomanic: Secondary | ICD-10-CM | POA: Diagnosis not present

## 2019-01-27 DIAGNOSIS — F411 Generalized anxiety disorder: Secondary | ICD-10-CM | POA: Diagnosis not present

## 2019-01-28 ENCOUNTER — Other Ambulatory Visit: Payer: Self-pay

## 2019-01-28 ENCOUNTER — Ambulatory Visit: Payer: BC Managed Care – PPO | Admitting: Family Medicine

## 2019-02-02 ENCOUNTER — Other Ambulatory Visit: Payer: Self-pay | Admitting: Family Medicine

## 2019-02-02 DIAGNOSIS — Z6841 Body Mass Index (BMI) 40.0 and over, adult: Secondary | ICD-10-CM

## 2019-02-02 NOTE — Telephone Encounter (Signed)
Forwarding medication refill request to the clinical pool for review. 

## 2019-02-03 DIAGNOSIS — Z3202 Encounter for pregnancy test, result negative: Secondary | ICD-10-CM | POA: Diagnosis not present

## 2019-02-05 ENCOUNTER — Other Ambulatory Visit: Payer: Self-pay | Admitting: Family Medicine

## 2019-02-05 NOTE — Telephone Encounter (Signed)
Requested medication (s) are due for refill today: yes  Requested medication (s) are on the active medication list: yes  Last refill:  11/13/2018  Future visit scheduled: no  Notes to clinic:  Review for refill   Requested Prescriptions  Pending Prescriptions Disp Refills   venlafaxine XR (EFFEXOR-XR) 150 MG 24 hr capsule [Pharmacy Med Name: VENLAFAXINE HCL ER 150 MG CAP] 90 capsule 0    Sig: TAKE 1 CAPSULE BY MOUTH DAILY WITH BREAKFAST     Psychiatry: Antidepressants - SNRI - desvenlafaxine & venlafaxine Failed - 02/05/2019  1:16 AM      Failed - LDL in normal range and within 360 days    LDL Calculated  Date Value Ref Range Status  10/23/2017 85 0 - 99 mg/dL Final         Failed - Total Cholesterol in normal range and within 360 days    Cholesterol, Total  Date Value Ref Range Status  10/23/2017 171 100 - 199 mg/dL Final         Failed - Triglycerides in normal range and within 360 days    Triglycerides  Date Value Ref Range Status  10/23/2017 142 0 - 149 mg/dL Final         Failed - Completed PHQ-2 or PHQ-9 in the last 360 days.      Passed - Last BP in normal range    BP Readings from Last 1 Encounters:  05/20/18 122/81         Passed - Valid encounter within last 6 months    Recent Outpatient Visits          2 months ago Bipolar disorder, current episode mixed, moderate ([place])   [facility] at [facility], Lilia Argue, MD   8 months ago Abnormal vaginal bleeding   Primary Care at [facility], [place], MD   9 months ago Bipolar disorder, current episode mixed, moderate [facility])   [facility] at [facility], Lilia Argue, MD   12 months ago Mood disorder [facility])   [facility] Care at [facility], Lilia Argue, MD   1 year ago Mood disorder [facility])   [facility] at [facility], Lilia Argue, MD

## 2019-02-06 DIAGNOSIS — Z113 Encounter for screening for infections with a predominantly sexual mode of transmission: Secondary | ICD-10-CM | POA: Diagnosis not present

## 2019-03-08 ENCOUNTER — Other Ambulatory Visit: Payer: Self-pay | Admitting: Family Medicine

## 2019-03-08 ENCOUNTER — Telehealth: Payer: Self-pay | Admitting: Family Medicine

## 2019-03-08 NOTE — Telephone Encounter (Signed)
Duplicate

## 2019-03-08 NOTE — Telephone Encounter (Signed)
Patient requesting Drospirenone-Ethinyl Estradiol-Levomefol (BEYAZ) 3-0.02-0.451 MG tablet but not on current medication list. informed patient please allow 48 to 72 hour turn around time, please advise

## 2019-03-08 NOTE — Telephone Encounter (Signed)
Patient requesting Drospirenone-Ethinyl Estradiol-Levomefol (BEYAZ) 3-0.02-0.451 MG tablet, informed patient please allow 48 to 72 hour turn around time, please advise

## 2019-03-10 DIAGNOSIS — Z3041 Encounter for surveillance of contraceptive pills: Secondary | ICD-10-CM | POA: Diagnosis not present

## 2019-04-06 DIAGNOSIS — Z760 Encounter for issue of repeat prescription: Secondary | ICD-10-CM | POA: Diagnosis not present

## 2019-05-09 ENCOUNTER — Other Ambulatory Visit: Payer: Self-pay | Admitting: Family Medicine

## 2019-05-11 ENCOUNTER — Other Ambulatory Visit: Payer: Self-pay | Admitting: Family Medicine

## 2019-05-11 NOTE — Telephone Encounter (Signed)
Pt requested :  Name from pharmacy: DROSP-EE-LEVOMEF 3-0.02-0.451        Will file in chart as: Drospirenone-Ethinyl Estradiol-Levomefol (BEYAZ) 3-0.02-0.451 MG tablet   No prescriber and med not listed on med list.

## 2019-05-17 ENCOUNTER — Other Ambulatory Visit: Payer: Self-pay | Admitting: Family Medicine

## 2019-05-17 DIAGNOSIS — L709 Acne, unspecified: Secondary | ICD-10-CM

## 2019-05-17 NOTE — Telephone Encounter (Signed)
Please inform if this med can be re ordered or do she need another office visit

## 2019-05-18 DIAGNOSIS — F603 Borderline personality disorder: Secondary | ICD-10-CM | POA: Diagnosis not present

## 2019-05-18 DIAGNOSIS — F310 Bipolar disorder, current episode hypomanic: Secondary | ICD-10-CM | POA: Diagnosis not present

## 2019-05-18 DIAGNOSIS — F411 Generalized anxiety disorder: Secondary | ICD-10-CM | POA: Diagnosis not present

## 2019-05-25 ENCOUNTER — Telehealth: Payer: Self-pay | Admitting: Family Medicine

## 2019-05-25 NOTE — Telephone Encounter (Signed)
Pt's father came in office to get form filled by PCP. Pt hasn't seen [place] in six months. Was advised to make an appt with provider or psychiatrist to have form filled.

## 2019-06-01 DIAGNOSIS — F411 Generalized anxiety disorder: Secondary | ICD-10-CM | POA: Diagnosis not present

## 2019-06-01 DIAGNOSIS — F310 Bipolar disorder, current episode hypomanic: Secondary | ICD-10-CM | POA: Diagnosis not present

## 2019-06-01 DIAGNOSIS — F603 Borderline personality disorder: Secondary | ICD-10-CM | POA: Diagnosis not present

## 2019-06-04 DIAGNOSIS — F411 Generalized anxiety disorder: Secondary | ICD-10-CM | POA: Diagnosis not present

## 2019-06-04 DIAGNOSIS — F310 Bipolar disorder, current episode hypomanic: Secondary | ICD-10-CM | POA: Diagnosis not present

## 2019-06-04 DIAGNOSIS — F603 Borderline personality disorder: Secondary | ICD-10-CM | POA: Diagnosis not present

## 2019-06-08 DIAGNOSIS — F3132 Bipolar disorder, current episode depressed, moderate: Secondary | ICD-10-CM | POA: Diagnosis not present

## 2019-06-08 DIAGNOSIS — F411 Generalized anxiety disorder: Secondary | ICD-10-CM | POA: Diagnosis not present

## 2019-06-08 DIAGNOSIS — F310 Bipolar disorder, current episode hypomanic: Secondary | ICD-10-CM | POA: Diagnosis not present

## 2019-06-08 DIAGNOSIS — F603 Borderline personality disorder: Secondary | ICD-10-CM | POA: Diagnosis not present

## 2019-06-11 ENCOUNTER — Telehealth (INDEPENDENT_AMBULATORY_CARE_PROVIDER_SITE_OTHER): Payer: BC Managed Care – PPO | Admitting: Family Medicine

## 2019-06-11 ENCOUNTER — Other Ambulatory Visit: Payer: Self-pay

## 2019-06-11 DIAGNOSIS — R41840 Attention and concentration deficit: Secondary | ICD-10-CM

## 2019-06-11 DIAGNOSIS — F411 Generalized anxiety disorder: Secondary | ICD-10-CM

## 2019-06-11 DIAGNOSIS — F603 Borderline personality disorder: Secondary | ICD-10-CM

## 2019-06-11 DIAGNOSIS — F410 Panic disorder [episodic paroxysmal anxiety] without agoraphobia: Secondary | ICD-10-CM

## 2019-06-11 DIAGNOSIS — F3163 Bipolar disorder, current episode mixed, severe, without psychotic features: Secondary | ICD-10-CM | POA: Diagnosis not present

## 2019-06-11 NOTE — Progress Notes (Signed)
Pt is wanting to discuss switching from effexor to wellbutrin. She feels the effexor is not working for her at all. Completed the gad 7 and phq9 screens. Gad7 (13)

## 2019-06-11 NOTE — Progress Notes (Signed)
Virtual Visit Note  I connected with patient on 06/11/19 at 630pm by phone and verified that I am speaking with the correct person using two identifiers. Kelli Howard is currently located at home and patient and mother is currently with them during visit. The provider, Rutherford Guys, MD is located in their office at time of visit.  I discussed the limitations, risks, security and privacy concerns of performing an evaluation and management service by telephone and the availability of in person appointments. I also discussed with the patient that there may be a patient responsible charge related to this service. The patient expressed understanding and agreed to proceed.   CC: mood disorder  HPI ? Last OV July 2020 Not going to mood center, she is going to a new behavioral health group in [place] - she started her on lithium about 2 weeks ago due to increase impulsivity in addition to lamictal, geodon, tegretol and effexor for diagnosis of bipolar  Patient and mother state that after after adding lithium (which she has since stopped) patient became very nauseous, sedated, did not want to do anything at all Patient/mother report that fatigue/sedation much improved with discontinuation of lithium  Patient is wondering her diagnosis of bipolar disorder as she feels that she is not getting better despite being on multiple mood stabilizers She has been hospitalized 4 times, [place] the past 2 years Per patient, in [place] they wondered if she had ADD and patient is also wondering the same thing She reports complete inability to complete simple tasks such as household chores, reading a book because she cant focus This makes her get really anxious/feel like a failure Has not been employed for more than several weeks at a time for over 2 years Mother has had to pickup her up from jobs after complete breakdowns were patient will lock herself crying in the  restroom She has upcoming disability hearing in march, requesting letter of support She reports that coping with her borderline personality disorder is better after intense DBT.  Patient would like to explore possible ADD as treatment for everything has been tried and still her day to day life has not improved much at all She denies SI  Patient is wondering about getting on wellbutrin Patient reports that she had genetic testing that showed wellbutrin would work for her  Continues to live with her parents  Current meds: carbamazapine 200mg  AM and 400mg  PM Ziprasidone 40mg  daily Lamictal 100mg  once a day (did not tolerate 200mg  daily) Effexor XR 150mg  daily  After given verbal permission by patient, I reached out to her current mental health provider  Per our phone conversation:  Patient presented back to care in the fall of 2020 in manic state: impulsive (having one night stands with men from dating app), spending lots of money inappropriately, not sleeping, tearful Started her on geodon and tegretol Most recently added lamictal - aware of patient not tolerating, so further increased geodon.  Also referred patient to counseling which she has been going to Concerned about wellbutrin worsening mania     Allergies  Allergen Reactions  . Peanuts [Peanut Oil] Anaphylaxis    Also: tree nuts   . Other Rash    mushrooms  . Banana     Unknown reaction   . Celery Oil Itching and Rash  . Shellfish Allergy Rash    Prior to Admission medications   Medication Sig Start Date End Date Taking?  Authorizing Provider  busPIRone (BUSPAR) 30 MG tablet Take 1 tablet (30 mg total) by mouth 2 (two) times daily. 04/21/18  Yes Myles Lipps, MD  clindamycin-benzoyl peroxide (BENZACLIN) gel APPLY TOPICALLY TO FACE TWICE A DAY 05/17/19  Yes Myles Lipps, MD  Drospirenone-Ethinyl Estradiol-Levomefol (BEYAZ) 3-0.02-0.451 MG tablet Take 1 tablet by mouth daily. Needs office visit for future  refills 05/13/19  Yes Myles Lipps, MD  DRYSOL 20 % external solution APPLY TO AFFECTED AREA EVERY DAY AT BEDTIME 05/18/19  Yes Myles Lipps, MD  EPINEPHrine 0.3 mg/0.3 mL IJ SOAJ injection Inject 0.3 mLs (0.3 mg total) into the muscle as needed for anaphylaxis. 11/12/18  Yes Myles Lipps, MD  fluticasone (FLONASE) 50 MCG/ACT nasal spray Place 1 spray into both nostrils daily. 11/12/18  Yes Myles Lipps, MD  hydroquinone 4 % cream APPLY TO AFFECTED AREA TWICE A DAY 11/12/18  Yes Myles Lipps, MD  Insulin Pen Needle (BD PEN NEEDLE NANO U/F) 32G X 4 MM MISC 1 Product by Does not apply route daily. Use with Victoza 11/12/18  Yes Myles Lipps, MD  lamoTRIgine (LAMICTAL) 100 MG tablet Take 1 tablet (100 mg total) by mouth daily. 04/21/18  Yes Myles Lipps, MD  norgestimate-ethinyl estradiol (ORTHO-CYCLEN) 0.25-35 MG-MCG tablet Take 1 tablet by mouth daily. 01/05/19  Yes Myles Lipps, MD  omeprazole (PRILOSEC) 20 MG capsule Take 1 capsule (20 mg total) by mouth daily. 10/23/17  Yes Myles Lipps, MD  traZODone (DESYREL) 100 MG tablet TAKE 2 TABLETS (200 MG TOTAL) BY MOUTH AT BEDTIME. 08/09/18  Yes Myles Lipps, MD  tretinoin (RETIN-A) 0.05 % cream APPLY TO AFFECTED AREA EVERY DAY AT BEDTIME 11/12/18  Yes Myles Lipps, MD  VICTOZA 18 MG/3ML SOPN INJECT 1.2 MG UNDER THE SKIN ONCE DAILY 02/03/19  Yes Myles Lipps, MD  venlafaxine XR (EFFEXOR-XR) 150 MG 24 hr capsule TAKE 1 CAPSULE BY MOUTH DAILY WITH BREAKFAST Patient not taking: Reported on 06/11/2019 05/10/19   Myles Lipps, MD    Past Medical History:  Diagnosis Date  . Allergy   . Anemia   . Enlarged heart   . Heart murmur   . Vitamin D deficiency     Past Surgical History:  Procedure Laterality Date  . DENTAL SURGERY    . TONSILLECTOMY      Social History   Tobacco Use  . Smoking status: Never Smoker  . Smokeless tobacco: Never Used  Substance Use Topics  . Alcohol use: Yes    Comment: occ    Family  History  Problem Relation Age of Onset  . Hypertension Mother   . Thyroid disease Mother   . Hypertension Sister   . Heart disease Sister   . Anxiety disorder Brother   . Depression Brother   . ADD / ADHD Other     ROS Per hpi  Objective  Vitals as reported by the patient: none  Gen: AAOx3, NAD Resp: breathing comfortably, speaking in full sentences, frustrated at times   ASSESSMENT and PLAN  1. Bipolar disorder, current episode mixed, severe, without psychotic features (HCC) 2. General anxiety disorder with panic attacks Currently under psych care, not stable but not danger to self or others. Patient declines hospitalization at this time.  Discussed would complete forms for student loans and disability  3. Borderline personality disorder (HCC) Completed intense DBT program, cont to participate in counseling.   4. Poor concentration Exploring ADD diagnosis and  treatment if needed is very reasonable given known co-morbidity with bipolar disorder.  Referring to [place] [facility] for further eval and treatment  FOLLOW-UP: after eval with [place] attention specialist   The above assessment and management plan was discussed with the patient. The patient verbalized understanding of and has agreed to the management plan. Patient is aware to call the clinic if symptoms persist or worsen. Patient is aware when to return to the clinic for a follow-up visit. Patient educated on when it is appropriate to go to the emergency department.    I provided 35 minutes of non-face-to-face time during this encounter  In addition to 14 minutes spent on phone call with mental health provider  Myles Lipps, MD Primary Care at [facility] [address] [place], [place] [address] Ph.  [phone] Fax [phone]

## 2019-06-14 ENCOUNTER — Other Ambulatory Visit: Payer: Self-pay | Admitting: Family Medicine

## 2019-06-14 NOTE — Telephone Encounter (Signed)
Please advise 

## 2019-06-14 NOTE — Telephone Encounter (Signed)
Pt states her father came back in the office and dropped off forms to be filled. She also states that pcp was to draft a letter for her disability. She is calling to check on the status of this paperwork. Please advise pt

## 2019-06-15 ENCOUNTER — Other Ambulatory Visit: Payer: Self-pay | Admitting: Family Medicine

## 2019-06-15 ENCOUNTER — Telehealth: Payer: Self-pay

## 2019-06-15 NOTE — Telephone Encounter (Signed)
Called pt to inform he of disability letter ready for pick. Pt is asking about form that was dropped off a few wks ago. Will follow up with pcp. No form was left at nurses station.

## 2019-06-28 ENCOUNTER — Telehealth: Payer: Self-pay | Admitting: Family Medicine

## 2019-06-28 NOTE — Telephone Encounter (Signed)
Patients Father fropped off paperwork for disabilty to be completed by [employer] / put in cma/ provider mail bos to be completed

## 2019-06-29 NOTE — Telephone Encounter (Signed)
Pt called to check on the status of her forms. More specifically, if Dr. Leretha Pol received the forms

## 2019-06-30 ENCOUNTER — Other Ambulatory Visit: Payer: Self-pay | Admitting: Family Medicine

## 2019-06-30 DIAGNOSIS — L709 Acne, unspecified: Secondary | ICD-10-CM

## 2019-07-01 ENCOUNTER — Telehealth: Payer: Self-pay | Admitting: Family Medicine

## 2019-07-01 NOTE — Telephone Encounter (Signed)
PATIENT IS CALLING WANTING TO KNOW WHEN PAPERWORK NEEDED FOR HER WORK WILL BE COMPLETED. NIKKI WILL CALL PT WHEN IT IS READY . I THINK Friday IS HER DEADLINE

## 2019-07-02 ENCOUNTER — Telehealth: Payer: Self-pay

## 2019-07-02 NOTE — Telephone Encounter (Signed)
Called pt to  pick foms, will be in today

## 2019-07-02 NOTE — Telephone Encounter (Signed)
Forms completed, please fax accordingly and notify patient. thanks

## 2019-08-04 ENCOUNTER — Other Ambulatory Visit: Payer: Self-pay | Admitting: Family Medicine

## 2019-08-04 DIAGNOSIS — L709 Acne, unspecified: Secondary | ICD-10-CM

## 2019-08-09 DIAGNOSIS — L910 Hypertrophic scar: Secondary | ICD-10-CM | POA: Diagnosis not present

## 2019-08-10 DIAGNOSIS — Z3041 Encounter for surveillance of contraceptive pills: Secondary | ICD-10-CM | POA: Diagnosis not present

## 2019-08-10 DIAGNOSIS — Z113 Encounter for screening for infections with a predominantly sexual mode of transmission: Secondary | ICD-10-CM | POA: Diagnosis not present

## 2019-08-10 DIAGNOSIS — Z114 Encounter for screening for human immunodeficiency virus [HIV]: Secondary | ICD-10-CM | POA: Diagnosis not present

## 2019-08-10 DIAGNOSIS — Z206 Contact with and (suspected) exposure to human immunodeficiency virus [HIV]: Secondary | ICD-10-CM | POA: Diagnosis not present

## 2019-08-12 ENCOUNTER — Other Ambulatory Visit: Payer: Self-pay

## 2019-08-17 ENCOUNTER — Telehealth: Payer: Self-pay

## 2019-08-17 NOTE — Telephone Encounter (Signed)
PA Key : [id number] Victoza 1.2 mg under skin once daily 18 mg / pen

## 2019-08-18 ENCOUNTER — Telehealth: Payer: Self-pay | Admitting: *Deleted

## 2019-08-18 NOTE — Telephone Encounter (Signed)
Denial letter for victoza put in providers box

## 2019-09-01 ENCOUNTER — Telehealth: Payer: Self-pay | Admitting: Family Medicine

## 2019-09-01 NOTE — Telephone Encounter (Signed)
Patient is wanting Dr. Leretha Pol to  send in a referral at [facility] off of [address] for an ultrsound on RGT breast . Patient has a lump .

## 2019-09-02 NOTE — Telephone Encounter (Signed)
Called patient to inform that she will have to have ov with pcp for breast concern. Pt has been schedule for 5/17 @320 

## 2019-09-09 ENCOUNTER — Other Ambulatory Visit: Payer: Self-pay | Admitting: Family Medicine

## 2019-09-09 DIAGNOSIS — L709 Acne, unspecified: Secondary | ICD-10-CM

## 2019-09-09 NOTE — Telephone Encounter (Signed)
Requested Prescriptions  Pending Prescriptions Disp Refills  . clindamycin-benzoyl peroxide (BENZACLIN) gel [Pharmacy Med Name: CLINDAMYCIN-BENZOYL PEROX 1-5%] 50 g 0    Sig: APPLY TOPICALLY TO FACE TWICE A DAY     Dermatology:  Acne preparations Passed - 09/09/2019  1:05 PM      Passed - Valid encounter within last 12 months    Recent Outpatient Visits          3 months ago Bipolar disorder, current episode mixed, severe, without psychotic features (HCC)   [facility], Meda Coffee, MD   10 months ago Bipolar disorder, current episode mixed, moderate (HCC)   [facility], Meda Coffee, MD   1 year ago Abnormal vaginal bleeding   Primary [facility] at [facility], Eilleen Kempf, MD   1 year ago Bipolar disorder, current episode mixed, moderate [facility])   [facility], Meda Coffee, MD   1 year ago Mood disorder [facility])   [facility], Meda Coffee, MD      Future Appointments            In 1 week Myles Lipps, MD Primary Care at [place], [facility]

## 2019-09-20 ENCOUNTER — Ambulatory Visit: Payer: BC Managed Care – PPO | Admitting: Family Medicine

## 2019-09-22 ENCOUNTER — Encounter: Payer: Self-pay | Admitting: Family Medicine

## 2019-09-24 ENCOUNTER — Encounter: Payer: Self-pay | Admitting: Family Medicine

## 2019-09-24 ENCOUNTER — Other Ambulatory Visit: Payer: Self-pay

## 2019-09-24 ENCOUNTER — Telehealth (INDEPENDENT_AMBULATORY_CARE_PROVIDER_SITE_OTHER): Payer: BC Managed Care – PPO | Admitting: Family Medicine

## 2019-09-24 VITALS — BP 119/70 | Wt 278.0 lb

## 2019-09-24 DIAGNOSIS — N6315 Unspecified lump in the right breast, overlapping quadrants: Secondary | ICD-10-CM

## 2019-09-24 MED ORDER — CICLOPIROX 8 % EX SOLN: Freq: Every day | CUTANEOUS | 0 refills | Status: DC

## 2019-09-24 NOTE — Progress Notes (Addendum)
Patient ID: Kelli Howard, female    DOB: Mar 25, 1991  Age: 29 y.o. MRN: [id number]  Chief Complaint  Patient presents with  . right breast lump x few years    no change in size and no pain per pt and is wanting referral for mammogram    Subjective:  Telemedicine visit.  Audio visit only  Patient identified herself both to the nurse and then to me.  Over the past year she has been in and out of the hospital for psychiatric reasons, and has not been able to get to see the doctor she says due to the Covid.  She has noticed a lump in her right breast axillary direction beyond the areola.  She says it is about the size of a $0.50 piece and it may have grown a little bit over the past year.  It is firm, movable, nontender.  She has not seen a doctor for it.  She insists on referral to a breast diagnostic center.  She has never had a breast exam at this facility.  There is a possible family history of breast cancer in an aunt no first-degree relatives.  Other than her bipolar disorder she is apparently healthy.  Current allergies, medications, problem list, past/family and social histories reviewed.  Objective:  BP 119/70 Comment: per patient  Wt 278 lb (126.1 kg) Comment: per patient  BMI 43.54 kg/m   Telemedicine visit, no examination  Assessment & Plan:   Assessment: 1. Breast lump on right side at 9 o'clock position       Plan: See instructions.  The patient was adamant that she go directly to a breast evaluation center.  No orders of the defined types were placed in this encounter.         Patient Instructions   Referral is being made to  [facility].  It will be next week before the referral can be made.  Since we have not seen you in person today, it is not possible for [place] to do a proper evaluation of the breast.  The breast center will evaluate you, might ask you to come back here to be checked.  In the event that you do not hear  from the referral or get in in the next couple of weeks, come back over here and get someone to examine you so this can be followed up on promptly.  If you have lab work done today you will be contacted with your lab results within the next 2 weeks.  If you have not heard from [place] then please contact us. The fastest way to get your results is to register for My Chart.   IF you received an x-ray today, you will receive an invoice from [facility]. Please contact [facility] at [phone] with questions or concerns regarding your invoice.   IF you received labwork today, you will receive an invoice from [facility]. Please contact [facility] at [phone] with questions or concerns regarding your invoice.   Our billing staff will not be able to assist you with questions regarding bills from these companies.  You will be contacted with the lab results as soon as they are available. The fastest way to get your results is to activate your My Chart account. Instructions are located on the last page of this paperwork. If you have not heard from [place] regarding the results in 2 weeks, please contact this office.        Return in  about 2 weeks (around 10/08/2019), or Breast evaluation and less it has already been done elsewhere.[name], MD 09/24/2019

## 2019-09-24 NOTE — Patient Instructions (Addendum)
Referral is being made to  [facility].  It will be next week before the referral can be made.  Since we have not seen you in person today, it is not possible for [place] to do a proper evaluation of the breast.  The breast center will evaluate you, might ask you to come back here to be checked.  In the event that you do not hear from the referral or get in in the next couple of weeks, come back over here and get someone to examine you so this can be followed up on promptly.  If you have lab work done today you will be contacted with your lab results within the next 2 weeks.  If you have not heard from [place] then please contact us. The fastest way to get your results is to register for My Chart.   IF you received an x-ray today, you will receive an invoice from [facility]. Please contact [facility] at [phone] with questions or concerns regarding your invoice.   IF you received labwork today, you will receive an invoice from [facility]. Please contact [facility] at [phone] with questions or concerns regarding your invoice.   Our billing staff will not be able to assist you with questions regarding bills from these companies.  You will be contacted with the lab results as soon as they are available. The fastest way to get your results is to activate your My Chart account. Instructions are located on the last page of this paperwork. If you have not heard from [place] regarding the results in 2 weeks, please contact this office.

## 2019-10-08 ENCOUNTER — Telehealth: Payer: Self-pay | Admitting: Family Medicine

## 2019-10-08 DIAGNOSIS — N6315 Unspecified lump in the right breast, overlapping quadrants: Secondary | ICD-10-CM

## 2019-10-08 NOTE — Telephone Encounter (Signed)
Pt called in regarding pts referral for a Ultrasound on R breast. Referral was denied because referrals is needing the order sent in. Referrals has messaged to Dr. Alwyn Ren who sent in the referral. Please advise.

## 2019-10-10 DIAGNOSIS — F4323 Adjustment disorder with mixed anxiety and depressed mood: Secondary | ICD-10-CM | POA: Diagnosis not present

## 2019-10-11 NOTE — Telephone Encounter (Signed)
I have called pt back and she was very upset that she may have to come into the office get evaluated her breast mass. She saw Dr. Alwyn Ren via tele-med visit on 09/24/19. She is not going to come in to the office just to get a referral. The referral that was sent by Dr. Alwyn Ren was denied due to not meeting internal guidelines.   I have informed the patient in order to receive her medical records she will have to sign a medical release. I did not see one on file for her.   Pt wanted to have the denied referral to the Breast cancer clinic so she can start a law suite.     Pt reports that she is not comfortable coming into the office because of COVID-19.   I have printed out the referral with the status and in order for pt to pick it up she will need to have a medical release.

## 2019-10-11 NOTE — Telephone Encounter (Signed)
Reviewed telemedicine visit note and referral comments Right breast US ordered as requested by Breast Center.

## 2019-10-11 NOTE — Telephone Encounter (Signed)
Pts [place] on Rt breast referral was denied as they are waiting on order, Dr. Alwyn Ren ordered but he does not return until Thursday is there anything you could do for her?

## 2019-10-19 DIAGNOSIS — Z30012 Encounter for prescription of emergency contraception: Secondary | ICD-10-CM | POA: Diagnosis not present

## 2019-10-20 ENCOUNTER — Ambulatory Visit
Admission: RE | Admit: 2019-10-20 | Discharge: 2019-10-20 | Disposition: A | Discharge status: Home / Self Care | Payer: BC Managed Care – PPO | Source: Ambulatory Visit | Attending: Family Medicine | Admitting: Family Medicine

## 2019-10-20 ENCOUNTER — Other Ambulatory Visit: Payer: Self-pay

## 2019-10-20 DIAGNOSIS — N6315 Unspecified lump in the right breast, overlapping quadrants: Secondary | ICD-10-CM

## 2019-10-20 DIAGNOSIS — N6310 Unspecified lump in the right breast, unspecified quadrant: Secondary | ICD-10-CM | POA: Diagnosis not present

## 2019-11-18 DIAGNOSIS — H9203 Otalgia, bilateral: Secondary | ICD-10-CM | POA: Diagnosis not present

## 2019-11-19 DIAGNOSIS — H6690 Otitis media, unspecified, unspecified ear: Secondary | ICD-10-CM | POA: Diagnosis not present

## 2019-11-21 DIAGNOSIS — H6690 Otitis media, unspecified, unspecified ear: Secondary | ICD-10-CM | POA: Diagnosis not present

## 2019-12-20 ENCOUNTER — Ambulatory Visit: Payer: Self-pay

## 2019-12-20 ENCOUNTER — Ambulatory Visit: Admission: EM | Admit: 2019-12-20 | Discharge: 2019-12-20 | Discharge status: Against Medical Advice | Payer: 59

## 2019-12-20 NOTE — ED Triage Notes (Signed)
Pt requesting full std screening. States was seen at another UC earlier today and is being tx'd for UTI.

## 2019-12-20 NOTE — ED Notes (Signed)
Pt refusing vaginal swab. States will go to parent planning where they test through urine. States want's blood work done only.

## 2019-12-23 ENCOUNTER — Ambulatory Visit (INDEPENDENT_AMBULATORY_CARE_PROVIDER_SITE_OTHER): Payer: 59 | Admitting: Family Medicine

## 2019-12-23 ENCOUNTER — Other Ambulatory Visit (HOSPITAL_COMMUNITY)
Admission: RE | Admit: 2019-12-23 | Discharge: 2019-12-23 | Disposition: A | Discharge status: Home / Self Care | Payer: 59 | Source: Ambulatory Visit | Attending: Family Medicine | Admitting: Family Medicine

## 2019-12-23 ENCOUNTER — Other Ambulatory Visit: Payer: Self-pay

## 2019-12-23 ENCOUNTER — Encounter: Payer: Self-pay | Admitting: Family Medicine

## 2019-12-23 VITALS — BP 108/72 | HR 100 | Temp 97.9°F | Ht 67.0 in | Wt 267.8 lb

## 2019-12-23 DIAGNOSIS — Z113 Encounter for screening for infections with a predominantly sexual mode of transmission: Secondary | ICD-10-CM | POA: Insufficient documentation

## 2019-12-23 DIAGNOSIS — Z87440 Personal history of urinary (tract) infections: Secondary | ICD-10-CM

## 2019-12-23 DIAGNOSIS — F39 Unspecified mood [affective] disorder: Secondary | ICD-10-CM | POA: Diagnosis not present

## 2019-12-23 DIAGNOSIS — L709 Acne, unspecified: Secondary | ICD-10-CM | POA: Diagnosis not present

## 2019-12-23 LAB — POCT URINALYSIS DIP (MANUAL ENTRY)
Bilirubin, UA: NEGATIVE
Blood, UA: NEGATIVE
Glucose, UA: NEGATIVE mg/dL
Ketones, POC UA: NEGATIVE mg/dL
Leukocytes, UA: NEGATIVE
Nitrite, UA: NEGATIVE
Protein Ur, POC: NEGATIVE mg/dL
Spec Grav, UA: 1.025 (ref 1.010–1.025)
Urobilinogen, UA: 0.2 E.U./dL
pH, UA: 5.5 (ref 5.0–8.0)

## 2019-12-23 MED ORDER — VENLAFAXINE HCL ER 75 MG PO CP24: ORAL_CAPSULE | ORAL | 3 refills | Status: AC

## 2019-12-23 MED ORDER — QUETIAPINE FUMARATE 100 MG PO TABS: 100.0000 mg | ORAL_TABLET | Freq: Every day | ORAL | 3 refills | Status: AC

## 2019-12-23 MED ORDER — DRYSOL 20 % EX SOLN: CUTANEOUS | 5 refills | Status: AC

## 2019-12-23 MED ORDER — TRETINOIN 0.05 % EX CREA: TOPICAL_CREAM | Freq: Every day | CUTANEOUS | 5 refills | Status: DC

## 2019-12-23 MED ORDER — EPINEPHRINE 0.3 MG/0.3ML IJ SOAJ: INTRAMUSCULAR | 2 refills | Status: AC

## 2019-12-23 MED ORDER — HYDROQUINONE 4 % EX CREA: TOPICAL_CREAM | CUTANEOUS | 5 refills | Status: AC

## 2019-12-23 MED ORDER — BUSPIRONE HCL 30 MG PO TABS: 30.0000 mg | ORAL_TABLET | Freq: Two times a day (BID) | ORAL | 3 refills | Status: AC | PRN

## 2019-12-23 MED ORDER — CLINDAMYCIN PHOS-BENZOYL PEROX 1-5 % EX GEL: CUTANEOUS | 3 refills | Status: DC

## 2019-12-23 MED ORDER — TRAZODONE HCL 100 MG PO TABS: ORAL_TABLET | ORAL | 3 refills | Status: AC

## 2019-12-23 NOTE — Progress Notes (Signed)
8/19/20214:28 PM  Kelli Howard Jan 22, 1991, 29 y.o., female [id number]  Chief Complaint  Patient presents with  . Urinary Frequency    went to the doctor for uti, given cipro feels the sx has not gone away yer  . SEXUALLY TRANSMITTED DISEASE    does not feel she been exposed, just screening  . Medication Refill    phychologist no longer in network, needs refills for meds    HPI:   Patient is a 29 y.o. female with past medical history significant for bipolar disorder, borderline personality disorder who presents today for UTI, STD screening, med refills  She was seen UC about 4 days ago, treated with cipro x 4 days Has one pill left, all symptoms resolved (pressure, and frequency)  Requesting routine STD screening  Non known exposures, no pelvic pain, no vaginal discharge  She has recently changed insurance Her current psychiatry - she was seeing virtually She is also seeing Murvin Natal, psychiatrist, for her ADD She has found one that takes her insurance, she has appt for mid November  She is also needs to establish with a new therapist  Depression screen [facility] 2/9 09/24/2019 06/11/2019 11/12/2018  Decreased Interest 2 2 1   Down, Depressed, Hopeless 1 2 1   PHQ - 2 Score 3 4 2   Altered sleeping 1 2 2   Tired, decreased energy 1 2 2   Change in appetite 1 1 1   Feeling bad or failure about yourself  1 1 1   Trouble concentrating 1 2 2   Moving slowly or fidgety/restless 0 1 1  Suicidal thoughts 0 0 0  PHQ-9 Score 8 13 11   Difficult doing work/chores Very difficult - -  Some recent data might be hidden    Fall Risk  09/24/2019 11/12/2018 05/20/2018 04/21/2018 03/05/2018  Falls in the past year? 0 0 0 0 No  Number falls in past yr: 0 - - - -  Injury with Fall? 0 - - - -  Follow up Falls evaluation completed Falls evaluation completed - - -     Allergies  Allergen Reactions  . Peanuts [Peanut Oil] Anaphylaxis    Also: tree nuts   . Other Rash    mushrooms  . Banana       Unknown reaction   . Celery Oil Itching and Rash  . Shellfish Allergy Rash    Prior to Admission medications   Medication Sig Start Date End Date Taking? Authorizing Provider  amoxicillin-clavulanate (AUGMENTIN) 875-125 MG tablet SMARTSIG:1 Tablet(s) By Mouth Every 12 Hours 11/19/19   [provider]  amphetamine-dextroamphetamine (ADDERALL) 15 MG tablet TAKE 1 TABLET TWICE A DAILY WITH FOOD 11/12/19   [provider]  busPIRone (BUSPAR) 30 MG tablet Take 1 tablet (30 mg total) by mouth 2 (two) times daily. 04/21/18   , MD  clindamycin-benzoyl peroxide (BENZACLIN) gel APPLY TOPICALLY TO FACE TWICE A DAY 09/09/19   01/13/2019, MD  Drospirenone-Ethinyl Estradiol-Levomefol (BEYAZ) 3-0.02-0.451 MG tablet TAKE 1 TABLET BY MOUTH DAILY. NEEDS OFFICE VISIT FOR FUTURE REFILLS 08/05/19   03/07/2018, MD  DRYSOL 20 % external solution APPLY TO AFFECTED AREA EVERY DAY AT BEDTIME 05/18/19   01/13/20, MD  EPINEPHRINE 0.3 mg/0.3 mL IJ SOAJ injection INJECT CONTENTS OF 1 INJECTOR INTO THE MUSCLE AS NEEDED FOR ANAPHYLAXIS 06/30/19   Myles Lipps, MD  hydroquinone 4 % cream APPLY TO AFFECTED AREA TWICE A DAY 11/12/18   Myles Lipps, MD  lamoTRIgine (LAMICTAL)  100 MG tablet Take 1 tablet (100 mg total) by mouth daily. 04/21/18   Myles Lipps, MD  traZODone (DESYREL) 100 MG tablet TAKE 2 TABLETS (200 MG TOTAL) BY MOUTH AT BEDTIME. 08/09/18   Myles Lipps, MD  tretinoin (RETIN-A) 0.05 % cream APPLY TO AFFECTED AREA EVERY DAY AT BEDTIME 06/30/19   Myles Lipps, MD  venlafaxine XR (EFFEXOR-XR) 150 MG 24 hr capsule TAKE 1 CAPSULE BY MOUTH DAILY WITH BREAKFAST 05/10/19   Myles Lipps, MD    Past Medical History:  Diagnosis Date  . Allergy   . Anemia   . Enlarged heart   . Heart murmur   . Vitamin D deficiency     Past Surgical History:  Procedure Laterality Date  . DENTAL SURGERY    . TONSILLECTOMY      Social History   Tobacco Use  .  Smoking status: Never Smoker  . Smokeless tobacco: Never Used  Substance Use Topics  . Alcohol use: Yes    Comment: occ    Family History  Problem Relation Age of Onset  . Hypertension Mother   . Thyroid disease Mother   . Hypertension Sister   . Heart disease Sister   . Anxiety disorder Brother   . Depression Brother   . ADD / ADHD Other     ROS Per hpi  OBJECTIVE:  Today's Vitals   12/23/19 1655  BP: 108/72  Pulse: 100  Temp: 97.9 F (36.6 C)  SpO2: 97%  Weight: 267 lb 12.8 oz (121.5 kg)  Height: 5\' 7"  (1.702 m)   Body mass index is 41.94 kg/m.   Wt Readings from Last 3 Encounters:  12/23/19 267 lb 12.8 oz (121.5 kg)  09/24/19 278 lb (126.1 kg)  11/12/18 270 lb (122.5 kg)     Physical Exam Vitals and nursing note reviewed.  Constitutional:      Appearance: She is well-developed.  HENT:     Head: Normocephalic and atraumatic.  Eyes:     General: No scleral icterus.    Conjunctiva/sclera: Conjunctivae normal.     Pupils: Pupils are equal, round, and reactive to light.  Pulmonary:     Effort: Pulmonary effort is normal.  Musculoskeletal:     Cervical back: Neck supple.  Skin:    General: Skin is warm and dry.  Neurological:     Mental Status: She is alert and oriented to person, place, and time.        Results for orders placed or performed in visit on 12/23/19 (from the past 24 hour(s))  POCT urinalysis dipstick     Status: None   Collection Time: 12/23/19  4:16 PM  Result Value Ref Range   Color, UA yellow yellow   Clarity, UA clear clear   Glucose, UA negative negative mg/dL   Bilirubin, UA negative negative   Ketones, POC UA negative negative mg/dL   Spec Grav, UA 12/25/19 0.737 - 1.025   Blood, UA negative negative   pH, UA 5.5 5.0 - 8.0   Protein Ur, POC negative negative mg/dL   Urobilinogen, UA 0.2 0.2 or 1.0 E.U./dL   Nitrite, UA Negative Negative   Leukocytes, UA Negative Negative    No results found.   ASSESSMENT and  PLAN  1. Mood disorder (HCC) Overall stable. Refilling meds until able to establish with new psychiatrist She will continue with her ADD psychiatrist  2. History of UTI - POCT urinalysis dipstick - no signs of infection  3.  Screening for STDs (sexually transmitted diseases) - HIV Antibody (routine testing w rflx) - Urine cytology ancillary only  4. Acne, unspecified acne type - clindamycin-benzoyl peroxide (BENZACLIN) gel; APPLY TOPICALLY TO FACE TWICE A DAY  Other orders - busPIRone (BUSPAR) 30 MG tablet; Take 1 tablet (30 mg total) by mouth 2 (two) times daily as needed (anxiety). - EPINEPHrine 0.3 mg/0.3 mL IJ SOAJ injection; INJECT CONTENTS OF 1 INJECTOR INTO THE MUSCLE AS NEEDED FOR ANAPHYLAXIS - hydroquinone 4 % cream; APPLY TO AFFECTED AREA TWICE A DAY - traZODone (DESYREL) 100 MG tablet; TAKE 1 TABLETS (100 MG TOTAL) BY MOUTH AT BEDTIME. - venlafaxine XR (EFFEXOR-XR) 75 MG 24 hr capsule; TAKE 1 CAPSULE BY MOUTH TWICE A DAY - QUEtiapine (SEROQUEL) 100 MG tablet; Take 1 tablet (100 mg total) by mouth at bedtime. - tretinoin (RETIN-A) 0.05 % cream; Apply topically at bedtime. - aluminum chloride (DRYSOL) 20 % external solution; APPLY TO AFFECTED AREA EVERY DAY AT BEDTIME  Return for pap.    Myles Lipps, MD Primary Care at [facility] [address] [place], [place] [address] Ph.  [phone] Fax [phone]

## 2019-12-24 ENCOUNTER — Encounter: Payer: Self-pay | Admitting: Family Medicine

## 2019-12-24 LAB — HIV ANTIBODY (ROUTINE TESTING W REFLEX): HIV Screen 4th Generation wRfx: NONREACTIVE

## 2019-12-27 ENCOUNTER — Telehealth: Payer: Self-pay | Admitting: *Deleted

## 2019-12-27 LAB — URINE CYTOLOGY ANCILLARY ONLY
Chlamydia: NEGATIVE
Comment: NEGATIVE
Comment: NEGATIVE
Comment: NORMAL
Neisseria Gonorrhea: NEGATIVE
Trichomonas: NEGATIVE

## 2019-12-27 NOTE — Telephone Encounter (Signed)
Called pharmacy patient has picked up retina A -- no PA needed Epi pen cannot be picked up until 9-12.   -No PA needed Clindamycin cannot be pick up until 9-12 No PA needed  [facility] was given to pharmacy patient must go through this first before it can be sent to [facility].

## 2019-12-30 ENCOUNTER — Other Ambulatory Visit: Payer: Self-pay | Admitting: Family Medicine

## 2019-12-30 NOTE — Telephone Encounter (Signed)
   Notes to clinic: requesting 90 day with refill   Requested Prescriptions  Pending Prescriptions Disp Refills   busPIRone (BUSPAR) 30 MG tablet [Pharmacy Med Name: BUSPIRONE HCL 30 MG TABLET] 180 tablet 1    Sig: Take 1 tablet (30 mg total) by mouth 2 (two) times daily as needed (anxiety).      Psychiatry: Anxiolytics/Hypnotics - Non-controlled Passed - 12/30/2019  8:31 AM      Passed - Valid encounter within last 6 months    Recent Outpatient Visits           1 week ago Mood disorder [facility])   [facility] at [facility], Meda Coffee, MD   3 months ago Breast lump on right side at 9 o'clock position   Primary Care at [facility], Sandria Bales, MD   6 months ago Bipolar disorder, current episode mixed, severe, without psychotic features [facility])   [facility] at [facility], Meda Coffee, MD   1 year ago Bipolar disorder, current episode mixed, moderate [facility])   [facility] at [facility], Meda Coffee, MD   1 year ago Abnormal vaginal bleeding   Primary Care at [facility], Eilleen Kempf, MD

## 2020-01-06 ENCOUNTER — Telehealth: Payer: 59 | Admitting: Physician Assistant

## 2020-01-06 DIAGNOSIS — N390 Urinary tract infection, site not specified: Secondary | ICD-10-CM | POA: Diagnosis not present

## 2020-01-06 MED ORDER — CEPHALEXIN 500 MG PO CAPS: ORAL_CAPSULE | ORAL | 0 refills | Status: DC

## 2020-01-06 NOTE — Progress Notes (Signed)

## 2020-01-07 ENCOUNTER — Ambulatory Visit: Payer: 59 | Admitting: Family Medicine

## 2020-01-11 ENCOUNTER — Encounter: Payer: Self-pay | Admitting: Family Medicine

## 2020-01-14 ENCOUNTER — Other Ambulatory Visit: Payer: Self-pay | Admitting: Family Medicine

## 2020-01-14 NOTE — Telephone Encounter (Signed)
Requested medication (s) are due for refill today: No  Requested medication (s) are on the active medication list: Yes  Last refill:  8/05/15/19   Future visit scheduled: Yes  Notes to clinic:  Pharmacy asking for 90 day supply.    Requested Prescriptions  Pending Prescriptions Disp Refills   QUEtiapine (SEROQUEL) 100 MG tablet [Pharmacy Med Name: QUETIAPINE FUMARATE 100 MG TAB] 90 tablet 2    Sig: TAKE 1 TABLET BY MOUTH EVERYDAY AT BEDTIME      Not Delegated - Psychiatry:  Antipsychotics - Second Generation (Atypical) - quetiapine Failed - 01/14/2020 12:32 PM      Failed - This refill cannot be delegated      Failed - ALT in normal range and within 180 days    ALT  Date Value Ref Range Status  05/20/2018 59 (H) 0 - 32 IU/L Final          Failed - AST in normal range and within 180 days    AST  Date Value Ref Range Status  05/20/2018 30 0 - 40 IU/L Final          Passed - Last BP in normal range    BP Readings from Last 1 Encounters:  12/23/19 108/72          Passed - Valid encounter within last 6 months    Recent Outpatient Visits           3 weeks ago Mood disorder [facility])   [facility] at [facility], Meda Coffee, MD   3 months ago Breast lump on right side at 9 o'clock position   Primary Care at [facility], Sandria Bales, MD   7 months ago Bipolar disorder, current episode mixed, severe, without psychotic features [facility])   [facility] at [facility], Meda Coffee, MD   1 year ago Bipolar disorder, current episode mixed, moderate (HCC)   [facility] at [facility], Meda Coffee, MD   1 year ago Abnormal vaginal bleeding   Primary Care at [facility], Eilleen Kempf, MD       Future Appointments             In 1 week Myles Lipps, MD Primary Care at [place], [facility]

## 2020-01-21 ENCOUNTER — Ambulatory Visit: Payer: 59 | Admitting: Family Medicine

## 2020-01-25 IMAGING — CR DG CHEST 2V
1 series · 2 of 2 positions shown · non-contrast
Comparison: January 03, 2016

CLINICAL DATA: Preoperative assessment for bariatric surgery.

EXAM:
CHEST - 2 VIEW

[Series 1: dg chest 2 view · 0.14mm/px · 2 of 2 slices shown]
[im 1/2]
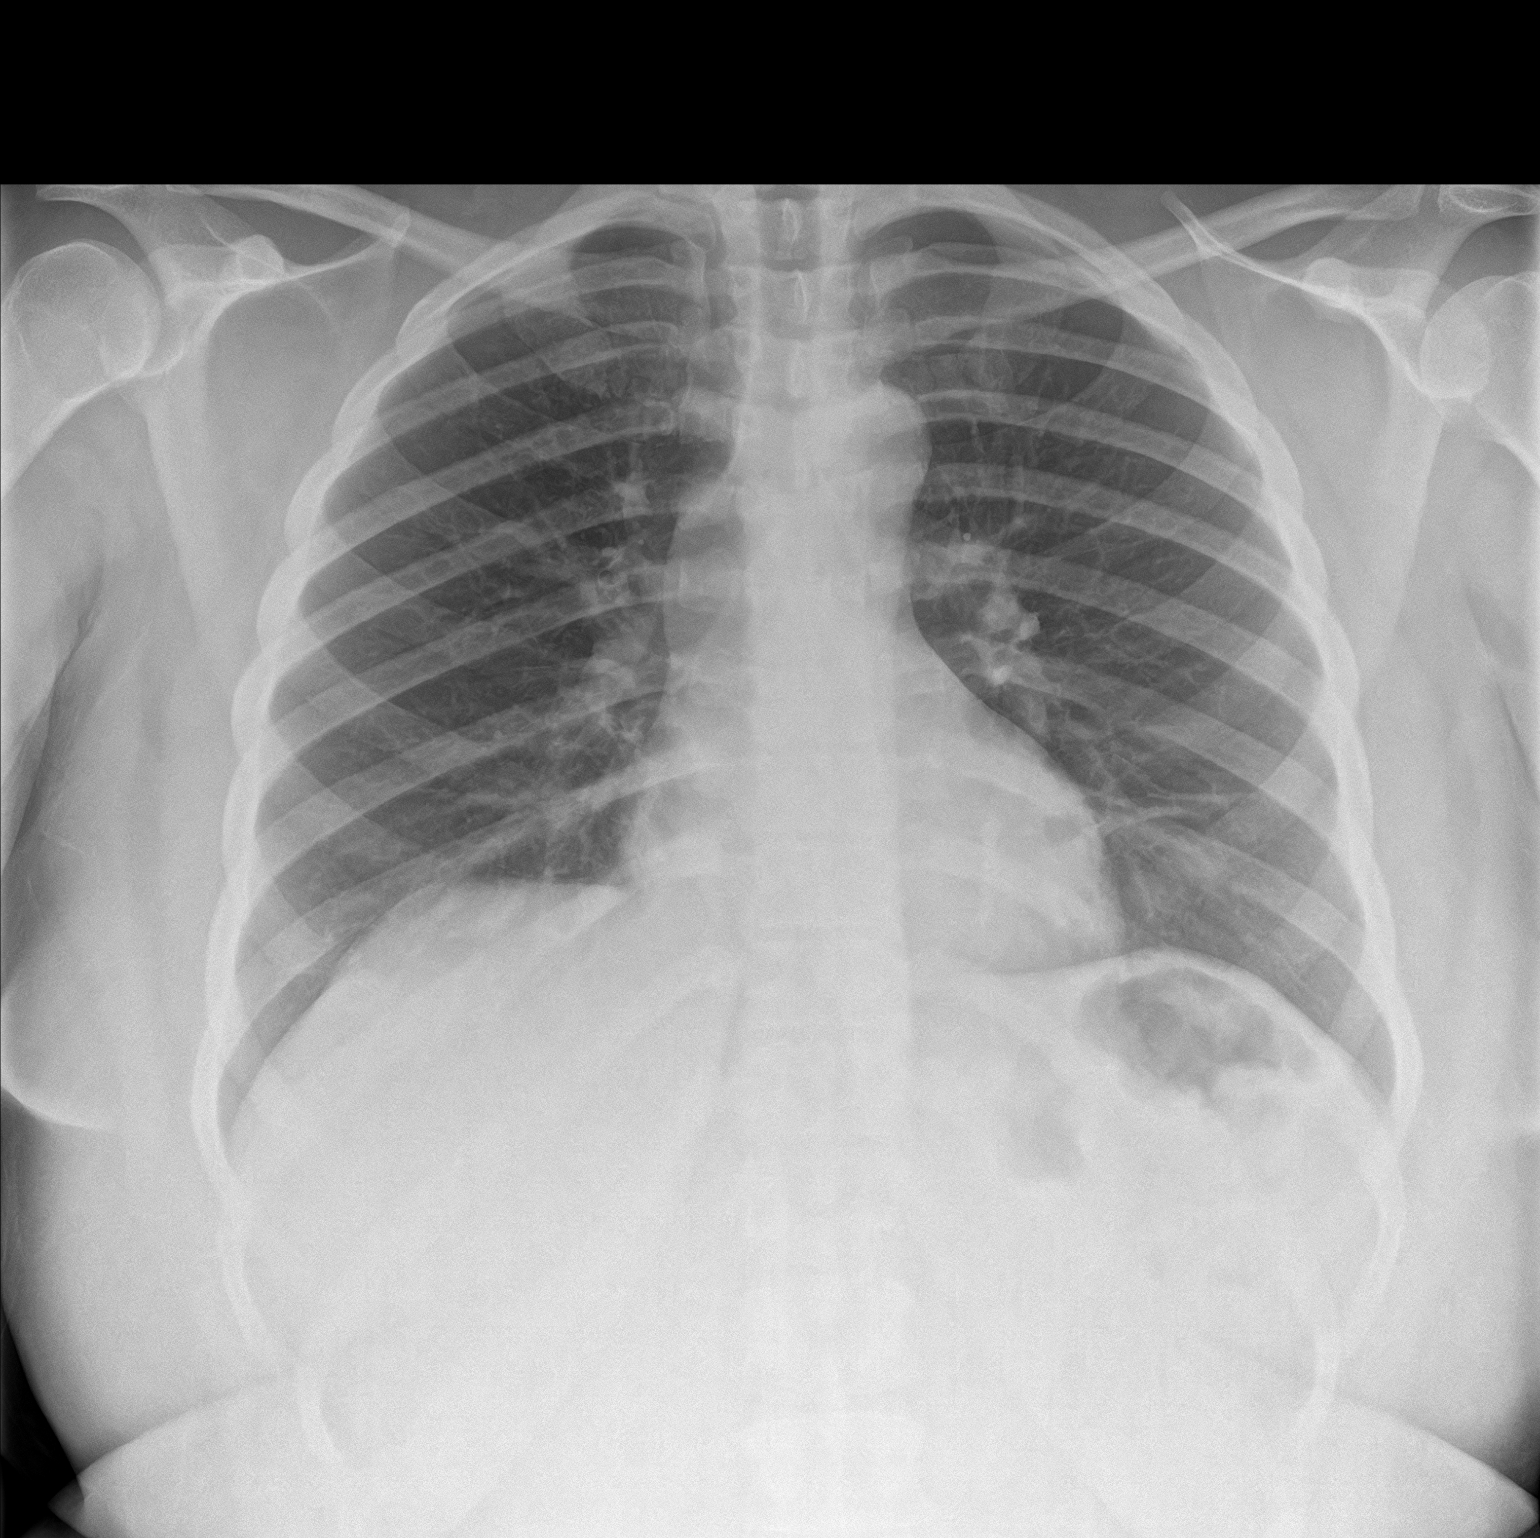
[im 2/2]
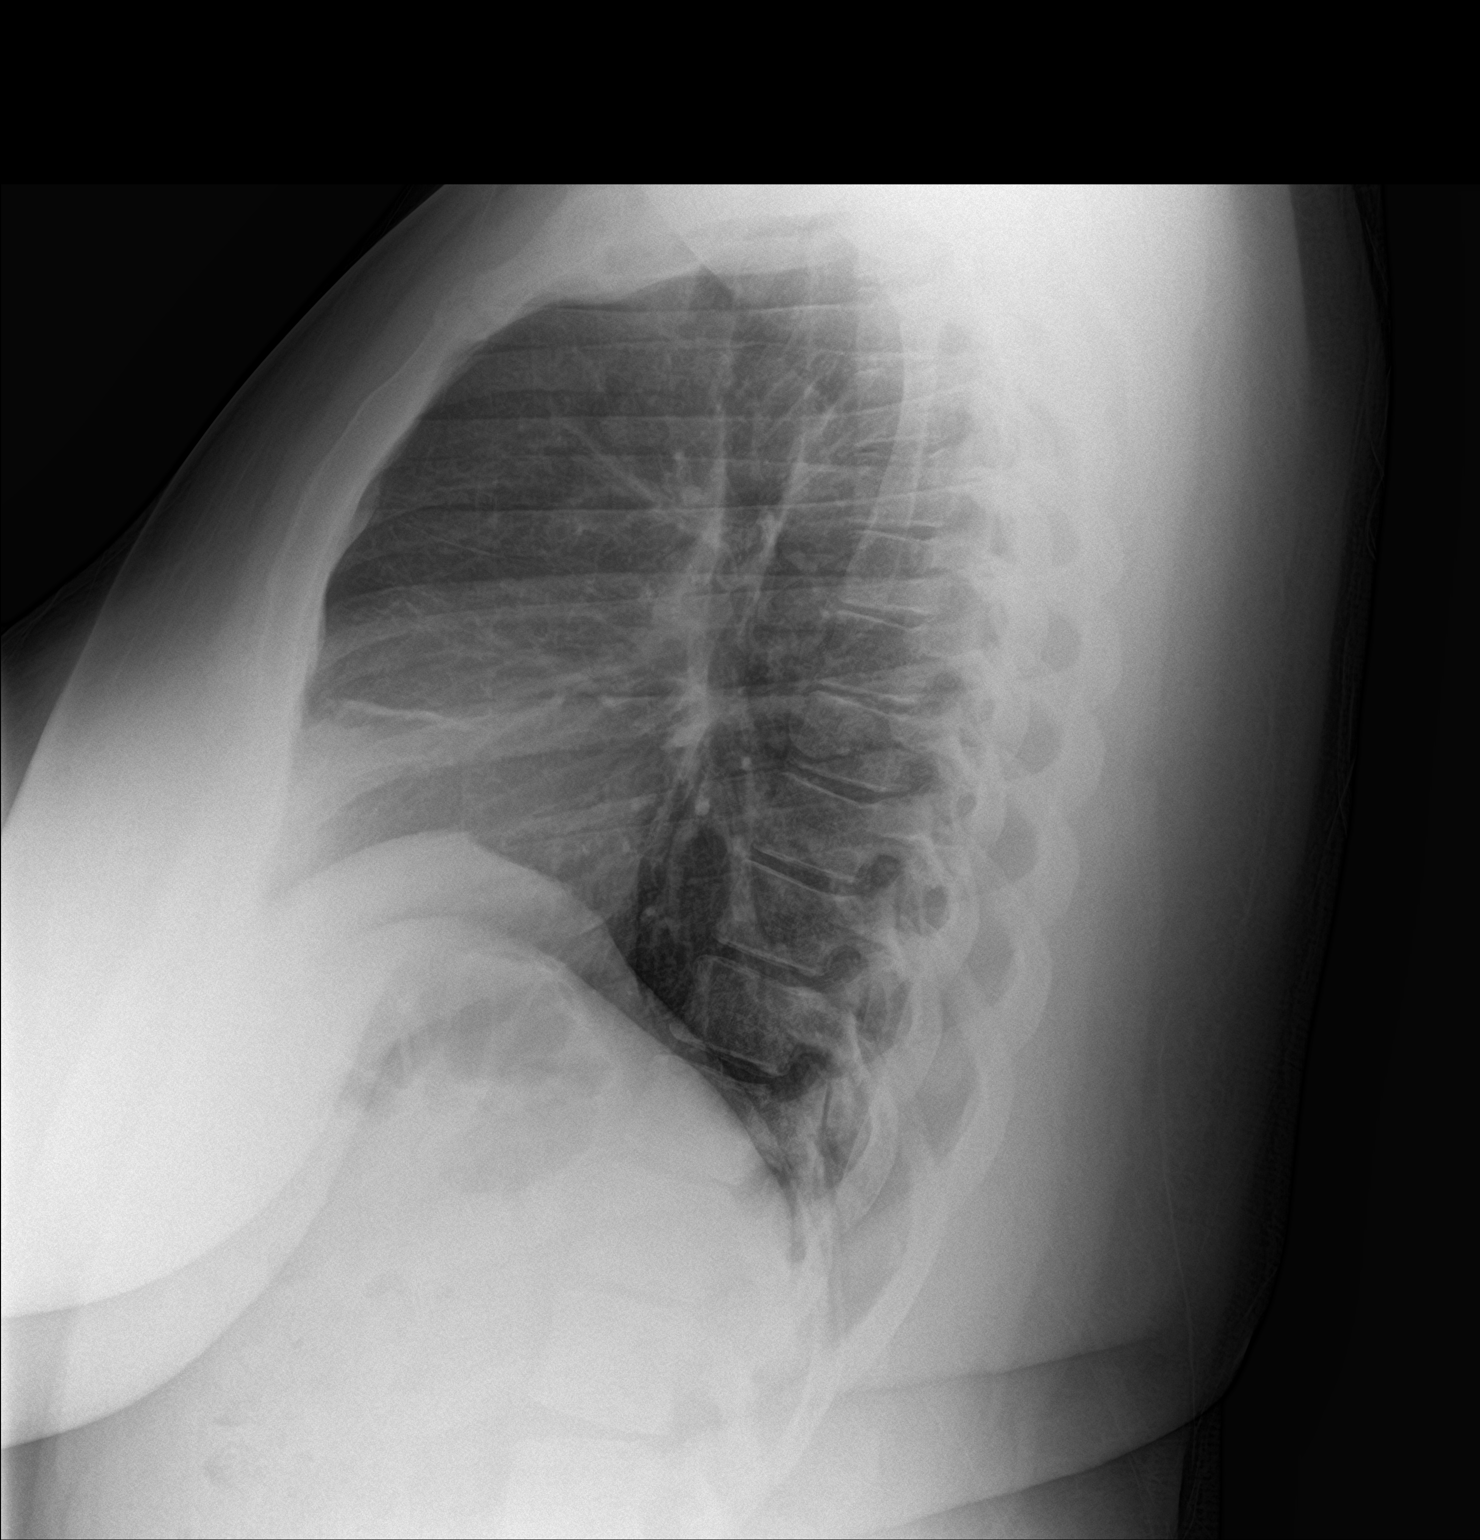

[2 of 2 positions shown; findings below may reference images not displayed]

FINDINGS: There is mild atelectasis in the lingula. The lungs elsewhere are
clear. The heart size and pulmonary vascularity are normal. No
adenopathy. No bone lesions.
IMPRESSION: Mild lingular atelectasis. Lungs elsewhere clear. Cardiac silhouette
within normal limits.
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
## 2020-03-18 ENCOUNTER — Encounter: Payer: Self-pay | Admitting: Family Medicine

## 2020-05-18 ENCOUNTER — Telehealth: Payer: Medicare Other | Admitting: Family

## 2020-05-18 DIAGNOSIS — N390 Urinary tract infection, site not specified: Secondary | ICD-10-CM

## 2020-05-18 MED ORDER — SULFAMETHOXAZOLE-TRIMETHOPRIM 800-160 MG PO TABS: 1.0000 | ORAL_TABLET | Freq: Two times a day (BID) | ORAL | 0 refills | Status: DC

## 2020-05-18 NOTE — Progress Notes (Signed)
We are sorry that you are not feeling well.  Here is how we plan to help!  Based on what you shared with me it looks like you most likely have a simple urinary tract infection.  A UTI (Urinary Tract Infection) is a bacterial infection of the bladder.  Most cases of urinary tract infections are simple to treat but a key part of your care is to encourage you to drink plenty of fluids and watch your symptoms carefully.  I have prescribed Bactrim DS One tablet twice a day for 5 days.  Your symptoms should gradually improve. Call us if the burning in your urine worsens, you develop worsening fever, back pain or pelvic pain or if your symptoms do not resolve after completing the antibiotic.  Urinary tract infections can be prevented by drinking plenty of water to keep your body hydrated.  Also be sure when you wipe, wipe from front to back and don't hold it in!  If possible, empty your bladder every 4 hours.  Your e-visit answers were reviewed by a board certified advanced clinical practitioner to complete your personal care plan.  Depending on the condition, your plan could have included both over the counter or prescription medications.  If there is a problem please reply  once you have received a response from your provider.  Your safety is important to us.  If you have drug allergies check your prescription carefully.    You can use MyChart to ask questions about today's visit, request a non-urgent call back, or ask for a work or school excuse for 24 hours related to this e-Visit. If it has been greater than 24 hours you will need to follow up with your provider, or enter a new e-Visit to address those concerns.   You will get an e-mail in the next two days asking about your experience.  I hope that your e-visit has been valuable and will speed your recovery. Thank you for using e-visits.  Greater than 5 minutes, yet less than 10 minutes of time have been spent researching, coordinating, and  implementing care for this patient.     

## 2020-05-24 ENCOUNTER — Encounter: Payer: Self-pay | Admitting: Family Medicine

## 2020-05-24 ENCOUNTER — Other Ambulatory Visit: Payer: Self-pay | Admitting: Family Medicine

## 2020-07-24 ENCOUNTER — Other Ambulatory Visit: Payer: Self-pay

## 2020-07-24 ENCOUNTER — Telehealth: Payer: Self-pay | Admitting: Psychiatry

## 2020-07-31 ENCOUNTER — Telehealth: Payer: Self-pay | Admitting: Psychiatry

## 2020-10-27 ENCOUNTER — Other Ambulatory Visit: Payer: Self-pay | Admitting: Urgent Care

## 2020-10-27 DIAGNOSIS — E049 Nontoxic goiter, unspecified: Secondary | ICD-10-CM

## 2020-11-02 ENCOUNTER — Ambulatory Visit
Admission: RE | Admit: 2020-11-02 | Discharge: 2020-11-02 | Disposition: A | Discharge status: Home / Self Care | Payer: Medicare Other | Source: Ambulatory Visit | Attending: Urgent Care | Admitting: Urgent Care

## 2020-11-02 DIAGNOSIS — E049 Nontoxic goiter, unspecified: Secondary | ICD-10-CM

## 2020-12-15 ENCOUNTER — Telehealth: Payer: Medicare Other | Admitting: Physician Assistant

## 2020-12-15 DIAGNOSIS — N3090 Cystitis, unspecified without hematuria: Secondary | ICD-10-CM

## 2020-12-15 MED ORDER — CEPHALEXIN 500 MG PO CAPS: 500.0000 mg | ORAL_CAPSULE | Freq: Two times a day (BID) | ORAL | 0 refills | Status: AC

## 2020-12-15 NOTE — Patient Instructions (Signed)
Kelli Howard, thank you for joining Piedad Climes, PA-C for today's virtual visit.  While this provider is not your primary care provider (PCP), if your PCP is located in our provider database this encounter information will be shared with them immediately following your visit.  Consent: (Patient) Kelli Howard provided verbal consent for this virtual visit at the beginning of the encounter.  Current Medications:  Current Outpatient Medications:    aluminum chloride (DRYSOL) 20 % external solution, APPLY TO AFFECTED AREA EVERY DAY AT BEDTIME, Disp: 35 mL, Rfl: 5   amphetamine-dextroamphetamine (ADDERALL) 15 MG tablet, TAKE 1 TABLET TWICE A DAILY WITH FOOD, Disp: , Rfl:    busPIRone (BUSPAR) 30 MG tablet, Take 1 tablet (30 mg total) by mouth 2 (two) times daily as needed (anxiety)., Disp: 30 tablet, Rfl: 3   cephALEXin (KEFLEX) 500 MG capsule, 1 cap po bid x 7 days, Disp: 14 capsule, Rfl: 0   clindamycin-benzoyl peroxide (BENZACLIN) gel, APPLY TOPICALLY TO FACE TWICE A DAY, Disp: 50 g, Rfl: 3   Drospirenone-Ethinyl Estradiol-Levomefol (BEYAZ) 3-0.02-0.451 MG tablet, TAKE 1 TABLET BY MOUTH DAILY. NEEDS OFFICE VISIT FOR FUTURE REFILLS, Disp: 84 tablet, Rfl: 5   EPINEPHrine 0.3 mg/0.3 mL IJ SOAJ injection, INJECT CONTENTS OF 1 INJECTOR INTO THE MUSCLE AS NEEDED FOR ANAPHYLAXIS, Disp: 2 each, Rfl: 2   hydroquinone 4 % cream, APPLY TO AFFECTED AREA TWICE A DAY, Disp: 28.35 g, Rfl: 5   QUEtiapine (SEROQUEL) 100 MG tablet, Take 1 tablet (100 mg total) by mouth at bedtime., Disp: 30 tablet, Rfl: 3   sulfamethoxazole-trimethoprim (BACTRIM DS) 800-160 MG tablet, Take 1 tablet by mouth 2 (two) times daily., Disp: 10 tablet, Rfl: 0   traZODone (DESYREL) 100 MG tablet, TAKE 1 TABLETS (100 MG TOTAL) BY MOUTH AT BEDTIME., Disp: 30 tablet, Rfl: 3   tretinoin (RETIN-A) 0.05 % cream, Apply topically at bedtime., Disp: 45 g, Rfl: 5   venlafaxine XR (EFFEXOR-XR) 75 MG 24 hr capsule, TAKE 1 CAPSULE BY  MOUTH TWICE A DAY, Disp: 60 capsule, Rfl: 3   Medications ordered in this encounter:  No orders of the defined types were placed in this encounter.    *If you need refills on other medications prior to your next appointment, please contact your pharmacy*  Follow-Up: Call back or seek an in-person evaluation if the symptoms worsen or if the condition fails to improve as anticipated.  Other Instructions Your symptoms are consistent with a bladder infection, also called acute cystitis. Please take your antibiotic (Keflex) as directed until all pills are gone.  Stay very well hydrated.  Consider a daily probiotic (Align, Culturelle, or Activia) to help prevent stomach upset caused by the antibiotic.  Taking a probiotic daily may also help prevent recurrent UTIs.  Also consider taking AZO (Phenazopyridine) tablets to help decrease pain with urination.    If symptoms are not resolving, anything worsens or new symptoms develop, you will need an in-office evaluation ASAP. Do not delay care.     Urinary Tract Infection A urinary tract infection (UTI) can occur any place along the urinary tract. The tract includes the kidneys, ureters, bladder, and urethra. A type of germ called bacteria often causes a UTI. UTIs are often helped with antibiotic medicine.  HOME CARE  If given, take antibiotics as told by your doctor. Finish them even if you start to feel better. Drink enough fluids to keep your pee (urine) clear or pale yellow. Avoid tea, drinks with caffeine, and bubbly (  carbonated) drinks. Pee often. Avoid holding your pee in for a long time. Pee before and after having sex (intercourse). Wipe from front to back after you poop (bowel movement) if you are a woman. Use each tissue only once. GET HELP RIGHT AWAY IF:  You have back pain. You have lower belly (abdominal) pain. You have chills. You feel sick to your stomach (nauseous). You throw up (vomit). Your burning or discomfort with peeing  does not go away. You have a fever. Your symptoms are not better in 3 days. MAKE SURE YOU:  Understand these instructions. Will watch your condition. Will get help right away if you are not doing well or get worse. Document Released: 10/09/2007 Document Revised: 01/15/2012 Document Reviewed: 11/21/2011 [facility] Patient Information 2015 [place], [place]. This information is not intended to replace advice given to you by your health care provider. Make sure you discuss any questions you have with your health care provider.    If you have been instructed to have an in-person evaluation today at a local Urgent Care facility, please use the link below. It will take you to a list of all of our available [facility], including address, phone number and hours of operation. Please do not delay care.  [facility]  If you or a family member do not have a primary care provider, use the link below to schedule a visit and establish care. When you choose a [facility] primary care physician or advanced practice provider, you gain a long-term partner in health. Find a Primary Care Provider  Learn more about [facility]'s in-office and virtual care options: [facility] - Get Care Now

## 2020-12-15 NOTE — Progress Notes (Signed)
Virtual Visit Consent   Kelli Howard, you are scheduled for a virtual visit with a [facility] provider today.     Just as with appointments in the office, your consent must be obtained to participate.  Your consent will be active for this visit and any virtual visit you may have with one of our providers in the next 365 days.     If you have a MyChart account, a copy of this consent can be sent to you electronically.  All virtual visits are billed to your insurance company just like a traditional visit in the office.    As this is a virtual visit, video technology does not allow for your provider to perform a traditional examination.  This may limit your provider's ability to fully assess your condition.  If your provider identifies any concerns that need to be evaluated in person or the need to arrange testing (such as labs, EKG, etc.), we will make arrangements to do so.     Although advances in technology are sophisticated, we cannot ensure that it will always work on either your end or our end.  If the connection with a video visit is poor, the visit may have to be switched to a telephone visit.  With either a video or telephone visit, we are not always able to ensure that we have a secure connection.     I need to obtain your verbal consent now.   Are you willing to proceed with your visit today?    Kelli Howard has provided verbal consent on 12/15/2020 for a virtual visit (video or telephone).   Piedad Climes, [place]   Date: 12/15/2020 8:58 AM   Virtual Visit via Video Note   I, Piedad Climes, connected with  Kelli Howard  ([id number], 28-11-1990) on 12/15/20 at  9:00 AM EDT by a video-enabled telemedicine application and verified that I am speaking with the correct person using two identifiers.  Location: Patient: Virtual Visit Location Patient: Home Provider: Virtual Visit Location Provider: Home Office   I discussed the limitations of evaluation and  management by telemedicine and the availability of in person appointments. The patient expressed understanding and agreed to proceed.    History of Present Illness: Kelli Howard is a 30 y.o. who identifies as a female who was assigned female at birth, and is being seen today for possible UTI. Patient endorses 2 days of foul-smelling urine, urgency and dysuria. Also notes low back pain bilaterally. Denies hematuria, flank pain, fevers, chills, vomiting. Feels she is able to empty her bladder. Denies vaginal pain or pressure. Denies vaginal discharge. No concern for STI. LMP 12/04/2020. Has history of UTI and this feels similar.   HPI: HPI  Problems:  Patient Active Problem List   Diagnosis Date Noted   Abnormal vaginal bleeding 05/20/2018   Bipolar disorder, current episode mixed, moderate (HCC) 04/21/2018   Borderline personality disorder (HCC) 04/21/2018   Migraine without aura and with status migrainosus, not intractable 12/14/2017   Generalized anxiety disorder with panic attacks 12/14/2017    Allergies:  Allergies  Allergen Reactions   Peanuts [Peanut Oil] Anaphylaxis    Also: tree nuts    Other Rash    mushrooms   Banana     Unknown reaction    Celery Oil Itching and Rash   Shellfish Allergy Rash   Medications:  Current Outpatient Medications:    cephALEXin (KEFLEX) 500 MG capsule, Take 1 capsule (500 mg total)  by mouth 2 (two) times daily for 7 days., Disp: 14 capsule, Rfl: 0   aluminum chloride (DRYSOL) 20 % external solution, APPLY TO AFFECTED AREA EVERY DAY AT BEDTIME, Disp: 35 mL, Rfl: 5   amphetamine-dextroamphetamine (ADDERALL) 15 MG tablet, TAKE 1 TABLET TWICE A DAILY WITH FOOD, Disp: , Rfl:    busPIRone (BUSPAR) 30 MG tablet, Take 1 tablet (30 mg total) by mouth 2 (two) times daily as needed (anxiety)., Disp: 30 tablet, Rfl: 3   clindamycin-benzoyl peroxide (BENZACLIN) gel, APPLY TOPICALLY TO FACE TWICE A DAY, Disp: 50 g, Rfl: 3   Drospirenone-Ethinyl  Estradiol-Levomefol (BEYAZ) 3-0.02-0.451 MG tablet, TAKE 1 TABLET BY MOUTH DAILY. NEEDS OFFICE VISIT FOR FUTURE REFILLS, Disp: 84 tablet, Rfl: 5   EPINEPHrine 0.3 mg/0.3 mL IJ SOAJ injection, INJECT CONTENTS OF 1 INJECTOR INTO THE MUSCLE AS NEEDED FOR ANAPHYLAXIS, Disp: 2 each, Rfl: 2   hydroquinone 4 % cream, APPLY TO AFFECTED AREA TWICE A DAY, Disp: 28.35 g, Rfl: 5   QUEtiapine (SEROQUEL) 100 MG tablet, Take 1 tablet (100 mg total) by mouth at bedtime., Disp: 30 tablet, Rfl: 3   traZODone (DESYREL) 100 MG tablet, TAKE 1 TABLETS (100 MG TOTAL) BY MOUTH AT BEDTIME., Disp: 30 tablet, Rfl: 3   tretinoin (RETIN-A) 0.05 % cream, Apply topically at bedtime., Disp: 45 g, Rfl: 5   venlafaxine XR (EFFEXOR-XR) 75 MG 24 hr capsule, TAKE 1 CAPSULE BY MOUTH TWICE A DAY, Disp: 60 capsule, Rfl: 3  Observations/Objective: Patient is well-developed, well-nourished in no acute distress.  Resting comfortably at home.  Head is normocephalic, atraumatic.  No labored breathing. Speech is clear and coherent with logical content.  Patient is alert and oriented at baseline.   Assessment and Plan: 1. Cystitis - cephALEXin (KEFLEX) 500 MG capsule; Take 1 capsule (500 mg total) by mouth 2 (two) times daily for 7 days.  Dispense: 14 capsule; Refill: 0 Mild symptoms. Prior history of UTI with identical symptom presentation. No alarm signs/symptoms present. Will treat empirically for uncomplicated UTI with Keflex 500 mg BID x 7 days. Supportive measures and OTC medications reviewed. Strict in person precautions discussed with patient.   Follow Up Instructions: I discussed the assessment and treatment plan with the patient. The patient was provided an opportunity to ask questions and all were answered. The patient agreed with the plan and demonstrated an understanding of the instructions.  A copy of instructions were sent to the patient via MyChart.  The patient was advised to call back or seek an in-person evaluation if  the symptoms worsen or if the condition fails to improve as anticipated.  Time:  I spent 10 minutes with the patient via telehealth technology discussing the above problems/concerns.    Piedad Climes, PA-C

## 2020-12-22 ENCOUNTER — Telehealth: Payer: Medicare Other | Admitting: Physician Assistant

## 2020-12-22 ENCOUNTER — Encounter: Payer: Self-pay | Admitting: Physician Assistant

## 2020-12-22 DIAGNOSIS — Z9109 Other allergy status, other than to drugs and biological substances: Secondary | ICD-10-CM

## 2020-12-22 MED ORDER — OLOPATADINE HCL 0.2 % OP SOLN: 1.0000 [drp] | Freq: Two times a day (BID) | OPHTHALMIC | 0 refills | Status: DC

## 2020-12-22 MED ORDER — LEVOCETIRIZINE DIHYDROCHLORIDE 5 MG PO TABS: 5.0000 mg | ORAL_TABLET | Freq: Every evening | ORAL | 0 refills | Status: DC

## 2020-12-22 MED ORDER — CETIRIZINE HCL 10 MG PO TABS: 10.0000 mg | ORAL_TABLET | Freq: Every day | ORAL | 11 refills | Status: AC

## 2020-12-22 NOTE — Progress Notes (Signed)
Patient had already completed evisit. Duplicate encounter.

## 2020-12-22 NOTE — Patient Instructions (Signed)
Kelli Howard, thank you for joining [name], PA-C for today's virtual visit.  While this provider is not your primary care provider (PCP), if your PCP is located in our provider database this encounter information will be shared with them immediately following your visit.  Consent: (Patient) Kelli Howard provided verbal consent for this virtual visit at the beginning of the encounter.  Current Medications:  Current Outpatient Medications:    cetirizine (ZYRTEC) 10 MG tablet, Take 1 tablet (10 mg total) by mouth daily., Disp: 30 tablet, Rfl: 11   Olopatadine HCl 0.2 % SOLN, Apply 1 drop to eye in the morning and at bedtime., Disp: 2.5 mL, Rfl: 0   aluminum chloride (DRYSOL) 20 % external solution, APPLY TO AFFECTED AREA EVERY DAY AT BEDTIME, Disp: 35 mL, Rfl: 5   amphetamine-dextroamphetamine (ADDERALL) 15 MG tablet, TAKE 1 TABLET TWICE A DAILY WITH FOOD, Disp: , Rfl:    busPIRone (BUSPAR) 30 MG tablet, Take 1 tablet (30 mg total) by mouth 2 (two) times daily as needed (anxiety)., Disp: 30 tablet, Rfl: 3   cephALEXin (KEFLEX) 500 MG capsule, Take 1 capsule (500 mg total) by mouth 2 (two) times daily for 7 days., Disp: 14 capsule, Rfl: 0   clindamycin-benzoyl peroxide (BENZACLIN) gel, APPLY TOPICALLY TO FACE TWICE A DAY, Disp: 50 g, Rfl: 3   Drospirenone-Ethinyl Estradiol-Levomefol (BEYAZ) 3-0.02-0.451 MG tablet, TAKE 1 TABLET BY MOUTH DAILY. NEEDS OFFICE VISIT FOR FUTURE REFILLS, Disp: 84 tablet, Rfl: 5   EPINEPHrine 0.3 mg/0.3 mL IJ SOAJ injection, INJECT CONTENTS OF 1 INJECTOR INTO THE MUSCLE AS NEEDED FOR ANAPHYLAXIS, Disp: 2 each, Rfl: 2   hydroquinone 4 % cream, APPLY TO AFFECTED AREA TWICE A DAY, Disp: 28.35 g, Rfl: 5   QUEtiapine (SEROQUEL) 100 MG tablet, Take 1 tablet (100 mg total) by mouth at bedtime., Disp: 30 tablet, Rfl: 3   traZODone (DESYREL) 100 MG tablet, TAKE 1 TABLETS (100 MG TOTAL) BY MOUTH AT BEDTIME., Disp: 30 tablet, Rfl: 3   tretinoin (RETIN-A) 0.05  % cream, Apply topically at bedtime., Disp: 45 g, Rfl: 5   venlafaxine XR (EFFEXOR-XR) 75 MG 24 hr capsule, TAKE 1 CAPSULE BY MOUTH TWICE A DAY, Disp: 60 capsule, Rfl: 3   Medications ordered in this encounter:  Meds ordered this encounter  Medications   cetirizine (ZYRTEC) 10 MG tablet    Sig: Take 1 tablet (10 mg total) by mouth daily.    Dispense:  30 tablet    Refill:  11    Order Specific Question:   Supervising Provider    Answer:   Eber Hong [[id number]]   Olopatadine HCl 0.2 % SOLN    Sig: Apply 1 drop to eye in the morning and at bedtime.    Dispense:  2.5 mL    Refill:  0    Order Specific Question:   Supervising Provider    Answer:   Hyacinth Meeker, BRIAN [[id number]]     *If you need refills on other medications prior to your next appointment, please contact your pharmacy*  Follow-Up: Call back or seek an in-person evaluation if the symptoms worsen or if the condition fails to improve as anticipated.  Other Instructions Allergies, Adult An allergy means that your body reacts to something that bothers it (allergen). This can happen from something that you eat, breathe in, or touch. Allergies often affect the nose, eyes, skin, and stomach. They can be mild, moderate, or very bad (severe). An allergy cannot spread from person  to person. They can happen at any age.Sometimes, people outgrow them. What are the causes? Outdoor things, such as pollen, car fumes, and mold. Indoor things, such as dust, smoke, mold, and pets. Foods. Medicines. Things that bother your skin, such as perfume and bug bites. What increases the risk? Having family members with allergies or asthma. What are the signs or symptoms? Symptoms depend on how bad your allergy is. Mild to moderate symptoms Runny nose, stuffy nose, or sneezing. Itchy mouth, ears, or throat. A feeling of mucus dripping down the back of your throat. Sore throat. Eyes that are itchy, red, watery, or puffy. A skin rash, or red, swollen  areas of skin (hives). Stomach cramps or bloating. Severe symptoms Very bad allergies to food, medicine, or bug bites may cause a very bad allergy reaction (anaphylaxis). This can be life-threatening. Symptoms include: A red face. Wheezing or coughing. Swollen lips, tongue, or mouth. Tight or swollen throat. Chest pain or tightness, or a fast heartbeat. Trouble breathing or shortness of breath. Pain in your belly (abdomen), vomiting, or watery poop (diarrhea). Feeling dizzy or fainting. How is this treated?     Treatment for this condition depends on your symptoms. Treatment may include: Cold, wet cloths for itching and swelling. Eye drops, nose sprays, or skin creams. Washing out your nose each day. A humidifier. Medicines. A change to the foods you eat. Being exposed again and again to tiny amounts of allergens. This helps your body get used to them. You might have: Allergy shots. Very small amounts of allergen put under your tongue. An emergency shot (auto-injector pen) if you have a very bad allergy reaction. This is a medicine with a needle. You can put it into your skin by yourself. Your doctor will teach you how to use it. Follow these instructions at home: Medicines  Take or apply over-the-counter and prescription medicines only as told by your doctor. If you are at risk for a very bad allergy reaction, keep an auto-injector pen with you all the time.  Eating and drinking Follow instructions from your doctor about what to eat and drink. Drink enough fluid to keep your pee (urine) pale yellow. General instructions If you have ever had a very bad allergy reaction, wear a medical alert bracelet or necklace. Stay away from things that you are allergic to. Keep all follow-up visits as told by your doctor. This is important. Contact a doctor if: Your symptoms do not get better with treatment. Get help right away if: You have symptoms of a very bad allergy reaction.  These include: A swollen mouth, tongue, or throat. Pain or tightness in your chest. Trouble breathing. Being short of breath. Dizziness. Fainting. Very bad pain in your belly. Vomiting. Watery poop. These symptoms may be an emergency. Do not wait to see if the symptoms will go away. Get medical help right away. Call your local emergency services (911 in the [place] Do not drive yourself to the hospital. Summary Take or apply over-the-counter and prescription medicines only as told by your doctor. Stay away from things you are allergic to. If you are at risk for a very bad allergy reaction, carry an auto-injector pen all the time. Wear a medical alert bracelet or necklace. Very bad allergy reactions can be life-threatening. Get help right away. This information is not intended to replace advice given to you by your health care provider. Make sure you discuss any questions you have with your healthcare provider. Document Revised: 03/03/2019 Document  Reviewed: 03/03/2019 Elsevier Patient Education  2022 Elsevier Inc.    If you have been instructed to have an in-person evaluation today at a local Urgent Care facility, please use the link below. It will take you to a list of all of our available [facility], including address, phone number and hours of operation. Please do not delay care.  [facility]  If you or a family member do not have a primary care provider, use the link below to schedule a visit and establish care. When you choose a [facility] primary care physician or advanced practice provider, you gain a long-term partner in health. Find a Primary Care Provider  Learn more about [facility]'s in-office and virtual care options: [facility] - Get Care Now

## 2020-12-22 NOTE — Progress Notes (Signed)
E visit for Allergic Rhinitis We are sorry that you are not feeling well.  Here is how we plan to help!  Based on what you have shared with me it looks like you have Allergic Rhinitis.  Rhinitis is when a reaction occurs that causes nasal congestion, runny nose, sneezing, and itching.  Most types of rhinitis are caused by an inflammation and are associated with symptoms in the eyes ears or throat. There are several types of rhinitis.  The most common are acute rhinitis, which is usually caused by a viral illness, allergic or seasonal rhinitis, and nonallergic or year-round rhinitis.  Nasal allergies occur certain times of the year.  Allergic rhinitis is caused when allergens in the air trigger the release of histamine in the body.  Histamine causes itching, swelling, and fluid to build up in the fragile linings of the nasal passages, sinuses and eyelids.  An itchy nose and clear discharge are common.  I recommend the following over the counter treatments: Xyzal 5 mg take 1 tablet daily  I also would recommend a nasal spray: Saline 1 spray into each nostril as needed  You may also benefit from eye drops such as: Systane 1-2 driops each eye twice daily as needed  HOME CARE:  You can use an over-the-counter saline nasal spray as needed Avoid areas where there is heavy dust, mites, or molds Stay indoors on windy days during the pollen season Keep windows closed in home, at least in bedroom; use air conditioner. Use high-efficiency house air filter Keep windows closed in car, turn AC on re-circulate Avoid playing out with dog during pollen season  GET HELP RIGHT AWAY IF:  If your symptoms do not improve within 10 days You become short of breath You develop yellow or green discharge from your nose for over 3 days You have coughing fits  MAKE SURE YOU:  Understand these instructions Will watch your condition Will get help right away if you are not doing well or get worse  Thank you for  choosing an e-visit. Your e-visit answers were reviewed by a board certified advanced clinical practitioner to complete your personal care plan. Depending upon the condition, your plan could have included both over the counter or prescription medications. Please review your pharmacy choice. Be sure that the pharmacy you have chosen is open so that you can pick up your prescription now.  If there is a problem you may message your provider in MyChart to have the prescription routed to another pharmacy. Your safety is important to [place]. If you have drug allergies check your prescription carefully.  For the next 24 hours, you can use MyChart to ask questions about today's visit, request a non-urgent call back, or ask for a work or school excuse from your e-visit provider. You will get an email in the next two days asking about your experience. I hope that your e-visit has been valuable and will speed your recovery.  I provided 5 minutes of non face-to-face time during this encounter for chart review and documentation.

## 2021-01-13 ENCOUNTER — Other Ambulatory Visit: Payer: Self-pay | Admitting: Physician Assistant

## 2021-01-13 DIAGNOSIS — Z9109 Other allergy status, other than to drugs and biological substances: Secondary | ICD-10-CM

## 2021-02-09 ENCOUNTER — Telehealth: Payer: Medicare Other

## 2021-02-12 ENCOUNTER — Telehealth: Payer: Medicare Other | Admitting: Family Medicine

## 2021-02-12 DIAGNOSIS — R300 Dysuria: Secondary | ICD-10-CM | POA: Diagnosis not present

## 2021-02-12 MED ORDER — NITROFURANTOIN MONOHYD MACRO 100 MG PO CAPS: 100.0000 mg | ORAL_CAPSULE | Freq: Two times a day (BID) | ORAL | 0 refills | Status: AC

## 2021-02-12 NOTE — Progress Notes (Signed)

## 2021-02-20 ENCOUNTER — Telehealth: Payer: Medicare Other | Admitting: Physician Assistant

## 2021-02-20 DIAGNOSIS — R300 Dysuria: Secondary | ICD-10-CM

## 2021-02-20 DIAGNOSIS — N390 Urinary tract infection, site not specified: Secondary | ICD-10-CM

## 2021-02-20 NOTE — Progress Notes (Signed)
Based on what you shared with me, I feel your condition warrants further evaluation and I recommend that you be seen in a face to face visit.  If your symptoms are unresolved after a course of antibiotics you will need to be seen in person so that a urine sample and culture can be collected. You can be seen at urgent care or your primary care provider.   NOTE: There will be NO CHARGE for this eVisit   If you are having a true medical emergency please call 911.      For an urgent face to face visit,  has six urgent care centers for your convenience:     [facility] Health Urgent Care Center at [facility] Directions [phone] [address] [place], [place] [address]    [facility] Health Urgent Care Center [facility]) Get Driving Directions [phone] [address] [place], [place] [address]  [facility] Health Urgent Care Center [facility]) Get Driving Directions [phone] [address] [place],  [place]  [address]  [facility] Health Urgent Care at [facility] Get Driving Directions [phone] [address] [place], [place] [address]   [facility] Health Urgent Care at [facility] Get Driving Directions  [phone] [address] [place], [place] [address]   [facility] Health Urgent Care at [facility] Directions [phone] [address], [address] [place], [place] [address]  Your MyChart E-visit questionnaire answers were reviewed by a board certified advanced clinical practitioner to complete your personal care plan based on your specific symptoms.  Thank you for using e-Visits.    Approximately 5 minutes was spent documenting and reviewing patient's chart.

## 2021-02-20 NOTE — Progress Notes (Signed)
Based on what you shared with me, I feel your condition warrants further evaluation and I recommend that you be seen in a face to face visit.  Giving recurrent/non-resolving symptoms you do need urine testing to culture the urine so we can make sure the UTI can be properly treated. This is our protocol for assessing via e-visit as if an infection is not treated with proper antibiotic it can lead to kidney infection which is a much more serious issue.    NOTE: There will be NO CHARGE for this eVisit   If you are having a true medical emergency please call 911.      For an urgent face to face visit, [facility] has six urgent care centers for your convenience:     [facility] Health Urgent Care Center at [facility] Directions [phone] [address] [place], [place] [address]    [facility] Health Urgent Care Center [facility]) Get Driving Directions [phone] [address] [place], [place] [address]  [facility] Health Urgent Care Center [facility]) Get Driving Directions [phone] [address] [place],  [place]  [address]  [facility] Health Urgent Care at [facility] Get Driving Directions [phone] [address] [place], [place] [address]   [facility] Health Urgent Care at [facility] Get Driving Directions  [phone] [address] [place], [place] [address]   [facility] Health Urgent Care at [facility] Directions [phone] [address], [address] [place], [place] [address]  Your MyChart E-visit questionnaire answers were reviewed by a board certified advanced clinical practitioner to complete your personal care plan based on your specific symptoms.  Thank you for using e-Visits.

## 2021-03-02 ENCOUNTER — Telehealth: Payer: Medicare Other

## 2021-03-09 ENCOUNTER — Telehealth: Payer: Medicare Other | Admitting: Nurse Practitioner

## 2021-03-09 ENCOUNTER — Telehealth: Payer: Medicare Other

## 2021-03-09 DIAGNOSIS — J0100 Acute maxillary sinusitis, unspecified: Secondary | ICD-10-CM

## 2021-03-09 MED ORDER — AMOXICILLIN-POT CLAVULANATE 875-125 MG PO TABS: 1.0000 | ORAL_TABLET | Freq: Two times a day (BID) | ORAL | 0 refills | Status: DC

## 2021-03-09 NOTE — Progress Notes (Signed)

## 2021-03-10 ENCOUNTER — Telehealth: Payer: Medicare Other | Admitting: Nurse Practitioner

## 2021-03-10 DIAGNOSIS — R051 Acute cough: Secondary | ICD-10-CM

## 2021-03-10 MED ORDER — ALBUTEROL SULFATE HFA 108 (90 BASE) MCG/ACT IN AERS
2.0000 | INHALATION_SPRAY | Freq: Four times a day (QID) | RESPIRATORY_TRACT | 0 refills | Status: DC | PRN
Start: 2021-03-10 — End: 2021-03-26

## 2021-03-10 MED ORDER — BENZONATATE 100 MG PO CAPS: 100.0000 mg | ORAL_CAPSULE | Freq: Three times a day (TID) | ORAL | 0 refills | Status: DC | PRN

## 2021-03-10 NOTE — Progress Notes (Signed)
We are sorry that you are not feeling well.  Here is how we plan to help!  Based on your presentation I believe you most likely have A cough due to bacteria.  When patients have a fever and a productive cough with a change in color or increased sputum production, we are concerned about bacterial bronchitis.  If left untreated it can progress to pneumonia.  If your symptoms do not improve with your treatment plan it is important that you contact your provider.   Finish antibiotic you were prescribed yesterday   In addition you may use A prescription cough medication called Tessalon Perles 100mg . You may take 1-2 capsules every 8 hours as needed for your cough.  I have  also called in albuterol inhaler  From your responses in the eVisit questionnaire you describe inflammation in the upper respiratory tract which is causing a significant cough.  This is commonly called Bronchitis and has four common causes:   Allergies Viral Infections Acid Reflux Bacterial Infection Allergies, viruses and acid reflux are treated by controlling symptoms or eliminating the cause. An example might be a cough caused by taking certain blood pressure medications. You stop the cough by changing the medication. Another example might be a cough caused by acid reflux. Controlling the reflux helps control the cough.  USE OF BRONCHODILATOR ("RESCUE") INHALERS: There is a risk from using your bronchodilator too frequently.  The risk is that over-reliance on a medication which only relaxes the muscles surrounding the breathing tubes can reduce the effectiveness of medications prescribed to reduce swelling and congestion of the tubes themselves.  Although you feel brief relief from the bronchodilator inhaler, your asthma may actually be worsening with the tubes becoming more swollen and filled with mucus.  This can delay other crucial treatments, such as oral steroid medications. If you need to use a bronchodilator inhaler daily,  several times per day, you should discuss this with your provider.  There are probably better treatments that could be used to keep your asthma under control.     HOME CARE Only take medications as instructed by your medical team. Complete the entire course of an antibiotic. Drink plenty of fluids and get plenty of rest. Avoid close contacts especially the very young and the elderly Cover your mouth if you cough or cough into your sleeve. Always remember to wash your hands A steam or ultrasonic humidifier can help congestion.   GET HELP RIGHT AWAY IF: You develop worsening fever. You become short of breath You cough up blood. Your symptoms persist after you have completed your treatment plan MAKE SURE YOU  Understand these instructions. Will watch your condition. Will get help right away if you are not doing well or get worse.    Thank you for choosing an e-visit.  Your e-visit answers were reviewed by a board certified advanced clinical practitioner to complete your personal care plan. Depending upon the condition, your plan could have included both over the counter or prescription medications.  Please review your pharmacy choice. Make sure the pharmacy is open so you can pick up prescription now. If there is a problem, you may contact your provider through and have the prescription routed to another pharmacy.  Your safety is important to [employer]. If you have drug allergies check your prescription carefully.   For the next 24 hours you can use MyChart to ask questions about today's visit, request a non-urgent call back, or ask for a work or school  excuse. You will get an email in the next two days asking about your experience. I hope that your e-visit has been valuable and will speed your recovery.  5-10 minutes spent reviewing and documenting in chart.

## 2021-03-21 ENCOUNTER — Telehealth: Payer: Medicare Other

## 2021-03-26 ENCOUNTER — Encounter: Payer: Self-pay | Admitting: Internal Medicine

## 2021-03-26 ENCOUNTER — Ambulatory Visit (INDEPENDENT_AMBULATORY_CARE_PROVIDER_SITE_OTHER): Payer: Medicare Other | Admitting: Internal Medicine

## 2021-03-26 ENCOUNTER — Other Ambulatory Visit: Payer: Self-pay

## 2021-03-26 VITALS — BP 124/70 | HR 92 | Temp 98.6°F | Ht 67.0 in | Wt 274.0 lb

## 2021-03-26 DIAGNOSIS — R059 Cough, unspecified: Secondary | ICD-10-CM

## 2021-03-26 DIAGNOSIS — J4540 Moderate persistent asthma, uncomplicated: Secondary | ICD-10-CM | POA: Diagnosis not present

## 2021-03-26 LAB — NITRIC OXIDE: Nitric Oxide: 5

## 2021-03-26 MED ORDER — ADVAIR HFA 230-21 MCG/ACT IN AERO: 2.0000 | INHALATION_SPRAY | Freq: Two times a day (BID) | RESPIRATORY_TRACT | 12 refills | Status: DC

## 2021-03-26 MED ORDER — MONTELUKAST SODIUM 10 MG PO TABS: 10.0000 mg | ORAL_TABLET | Freq: Every day | ORAL | 11 refills | Status: AC

## 2021-03-26 MED ORDER — ALBUTEROL SULFATE HFA 108 (90 BASE) MCG/ACT IN AERS: 2.0000 | INHALATION_SPRAY | Freq: Four times a day (QID) | RESPIRATORY_TRACT | 5 refills | Status: DC | PRN

## 2021-03-26 NOTE — Progress Notes (Signed)
Kelli Howard    [id number]    1990-12-02  Primary Care Physician:Santiago Claudie Leach, MD  Referring Physician: Jacelyn Pi, Lilia Argue, MD [place],  [place] [id number] Reason for Consultation: coughing and wheezing Date of Consultation: 03/26/2021  Chief complaint:   Chief Complaint  Patient presents with   Consult    Pt is here due to having complaints of cough and wheezing.  Pt states she had the flu about 2 weeks ago. States her symptoms are worse in the morning.     HPI: Kelli Howard is a 30 y.o. woman who presents for new patient evaluation for coughing an wheezing. She had influenza two weeks ago. Symptoms started since then.   She has an albuterol inhaler for symptoms of dyspnea, wheezing. She did take this and it does help. She has used it twice a day for the last two weeks. She was started on symbicort a week ago. Hasn't noticed much of a difference.   She gets out of breath with minimal exertion including daily activities of living around the house.   She had pneumonia a few years ago treated as an outpatient. In 2019 had chest xray for pre-op evaluation for bariatric surgery which showed mild atelectasis.   No childhood respiratory asthma.   She has food allergies - nuts and shellfish.  Denies seasonal allergies. But she takes claritin daily for her dogs. Still having ongoing nasal congestion.   Cold weather is a trigger.    Social history:  Occupation: full time in school at [employer], Careers information officer.  Exposures: lives at home with roommate. Pet dogs Smoking history: never smoker, no passive smoke exposure.   Social History   Occupational History   Not on file  Tobacco Use   Smoking status: Never   Smokeless tobacco: Never  Vaping Use   Vaping Use: Never used  Substance and Sexual Activity   Alcohol use: Yes    Comment: occ   Drug use: Yes    Frequency: 2.0 times per week    Types: Marijuana    Comment:  None over the past monthh   Sexual activity: Not Currently    Birth control/protection: Pill    Relevant family history:  Family History  Problem Relation Age of Onset   Hypertension Mother    Thyroid disease Mother    Hypertension Sister    Heart disease Sister    Anxiety disorder Brother    Depression Brother    ADD / ADHD Other     Past Medical History:  Diagnosis Date   Allergy    Anemia    Enlarged heart    Heart murmur    Vitamin D deficiency     Past Surgical History:  Procedure Laterality Date   DENTAL SURGERY     TONSILLECTOMY       Physical Exam: Blood pressure 124/70, pulse 92, temperature 98.6 F (37 C), temperature source Oral, height $RemoveBefo'5\' 7"'[id number]$  (1.702 m), weight 274 lb (124.3 kg), SpO2 99 %. Gen:      No acute distress ENT:  mallampati IV, no nasal polyps, mucus membranes moist Lungs:    No increased respiratory effort, symmetric chest wall excursion, clear to auscultation bilaterally, no wheezes or crackles CV:         Regular rate and rhythm; no murmurs, rubs, or gallops.  No pedal edema Abd:      + bowel sounds; soft,  non-tender; no distension MSK: no acute synovitis of DIP or PIP joints, no mechanics hands.  Skin:      Warm and dry; no rashes Neuro: normal speech, no focal facial asymmetry Psych: alert and oriented x3, normal mood and affect   Data Reviewed/Medical Decision Making:  Independent interpretation of tests: Imaging:  Review of patient's chest xray October 2019 images revealed no acute process. The patient's images have been independently reviewed by me.    PFTs: I have personally reviewed the patient's PFTs and normal spirometry. Feno 5 ppb No flowsheet data found.  Labs:  Lab Results  Component Value Date   WBC 6.8 05/20/2018   HGB 12.8 05/20/2018   HCT 40.2 05/20/2018   MCV 84 05/20/2018   PLT 326 05/20/2018   Lab Results  Component Value Date   NA 143 05/20/2018   K 4.7 05/20/2018   CL 104 05/20/2018   CO2 21  05/20/2018     Immunization status:  Immunization History  Administered Date(s) Administered   DTP 11/23/1992, 01/26/1993, 03/27/1993, 12/19/1993   HPV Quadrivalent 07/09/2012, 08/28/2012, 01/01/2013   Hepatitis A 07/09/2012   Hepatitis B, adult 11/23/1992, 01/26/1993, 12/19/1993   HiB (PRP-OMP) 11/23/1992, 01/26/1993, 03/27/1993, 12/19/1993   Influenza Inj Mdck Quad Pf 02/12/2017   Influenza,inj,Quad PF,6+ Mos 01/29/2018   MMR 12/19/1993   Meningococcal Polysaccharide 10/14/2006   Moderna Sars-Covid-2 Vaccination 07/23/2019, 08/20/2019, 03/18/2020, 09/06/2020   OPV 11/23/1992, 01/26/1993, 03/27/1993   Tdap 05/06/2010, 01/17/2011     I reviewed prior external note(s) from PCP/Urgent care  I reviewed the result(s) of the labs and imaging as noted above.   I have ordered  spirometry and feno  Assessment: Moderate persistent asthma, not well controlled Food allergies Allergic rhinitis Recent Influenza A infection  Plan/Recommendations: Suspect activation of asthma from recent influenza A infection Increase ICS-LABA with advair 2 puffs twice a day. Continue prn albuterol Will add singulair  We discussed disease management and progression at length today regarding asthma.   Return to Care: Return in about 6 weeks (around 05/07/2021).  Lenice Llamas, MD [facility]  CC: Jacelyn Pi, [place], [place]

## 2021-03-26 NOTE — Patient Instructions (Signed)
Please schedule follow up scheduled with myself in 6 weeks.  If my schedule is not open yet, we will contact you with a reminder closer to that time.  Stop symbicort. Switch to advair 2 puffs in the morning, 2 puffs at night.  Continue the albuterol rescue inhaler as needed.  Start taking montelukast for your breathing and allergies.

## 2021-03-26 NOTE — Progress Notes (Signed)
The patient has been prescribed the inhaler advair. Inhaler technique was demonstrated to patient. The patient subsequently demonstrated correct technique.  

## 2021-04-11 ENCOUNTER — Telehealth: Payer: Medicare Other | Admitting: Emergency Medicine

## 2021-04-11 DIAGNOSIS — R300 Dysuria: Secondary | ICD-10-CM | POA: Diagnosis not present

## 2021-04-11 MED ORDER — CEPHALEXIN 500 MG PO CAPS: 500.0000 mg | ORAL_CAPSULE | Freq: Two times a day (BID) | ORAL | 0 refills | Status: DC

## 2021-04-11 NOTE — Progress Notes (Signed)

## 2021-05-09 ENCOUNTER — Telehealth: Payer: Medicare Other | Admitting: Nurse Practitioner

## 2021-05-09 DIAGNOSIS — R300 Dysuria: Secondary | ICD-10-CM

## 2021-05-09 MED ORDER — NITROFURANTOIN MONOHYD MACRO 100 MG PO CAPS: 100.0000 mg | ORAL_CAPSULE | Freq: Two times a day (BID) | ORAL | 0 refills | Status: DC

## 2021-05-09 NOTE — Progress Notes (Signed)

## 2021-05-11 ENCOUNTER — Ambulatory Visit: Payer: Medicare Other | Admitting: Internal Medicine

## 2021-05-21 ENCOUNTER — Telehealth: Payer: Medicare Other | Admitting: Nurse Practitioner

## 2021-05-21 ENCOUNTER — Other Ambulatory Visit: Payer: Self-pay | Admitting: Nurse Practitioner

## 2021-05-21 DIAGNOSIS — L709 Acne, unspecified: Secondary | ICD-10-CM

## 2021-05-21 MED ORDER — TRETINOIN 0.05 % EX CREA: TOPICAL_CREAM | Freq: Every day | CUTANEOUS | 0 refills | Status: DC

## 2021-05-21 MED ORDER — CLINDAMYCIN PHOS-BENZOYL PEROX 1-5 % EX GEL: CUTANEOUS | 0 refills | Status: DC

## 2021-05-21 NOTE — Progress Notes (Signed)
We are sorry that you are experiencing this issue.  Here is how we plan to help!  Based on what you shared with me it looks like you have cystic acne.  Acne is a disorder of the hair follicles and oil glands (sebaceous glands). The sebaceous glands secrete oils to keep the skin moist.  When the glands get clogged, it can lead to pimples or cysts.  These cysts may become infected and leave scars. Acne is very common and normally occurs at puberty.  Acne is also inherited.  Your personal care plan consists of the following recommendations:  I recommend that you use a daily cleanser  You might try an over the counter cleanser that has benzoyl peroxide.  I recommend that you start with a product that has 2.5% benzoyl peroxide.  Stronger concentrations have not been shown to be more effective.  I have prescribed a topical gel with an antibiotic:  Clindamycin-benzoyl peroxide gel.  This gel should be applied to the affected areas twice a day.  Be sure to read the package insert to understand potential side effects.  I have also prescribed one of the following additional therapies: We will also refill your RETIN-A cream to be used at bedtime.   If excessive dryness or peeling occurs, reduce dose frequency or concentration of the topical scrubs.  If excessive stinging or burning occurs, remove the topical gel with mild soap and water and resume at a lower dose the next day.  Remember oral antibiotics and topical acne treatments may increase your sensitivity to the sun!  HOME CARE: Do not squeeze pimples because that can often lead to infections, worse acne, and scars. Use a moisturizer that contains retinoid or fruit acids that may inhibit the development of new acne lesions. Although there is not a clear link that foods can cause acne, doctors do believe that too many sweets predispose you to skin problems.  GET HELP RIGHT AWAY IF: If your acne gets worse or is not better within 10 days. If you  become depressed. If you become pregnant, discontinue medications and call your OB/GYN.  MAKE SURE YOU: Understand these instructions. Will watch your condition. Will get help right away if you are not doing well or get worse.  Thank you for choosing an e-visit.  Your e-visit answers were reviewed by a board certified advanced clinical practitioner to complete your personal care plan. Depending upon the condition, your plan could have included both over the counter or prescription medications.  Please review your pharmacy choice. Make sure the pharmacy is open so you can pick up prescription now. If there is a problem, you may contact your provider through [employer] and have the prescription routed to another pharmacy.  Your safety is important to [place]. If you have drug allergies check your prescription carefully.   For the next 24 hours you can use MyChart to ask questions about today's visit, request a non-urgent call back, or ask for a work or school excuse. You will get an email in the next two days asking about your experience. I hope that your e-visit has been valuable and will speed your recovery.   I spent approximately 7 minutes reviewing the patient's history, current symptoms and coordinating their plan of care today.    Meds ordered this encounter  Medications   tretinoin (RETIN-A) 0.05 % cream    Sig: Apply topically at bedtime.    Dispense:  45 g    Refill:  0  clindamycin-benzoyl peroxide (BENZACLIN) gel    Sig: APPLY TOPICALLY TO FACE TWICE A DAY    Dispense:  50 g    Refill:  0

## 2021-05-25 ENCOUNTER — Other Ambulatory Visit: Payer: Self-pay | Admitting: Nurse Practitioner

## 2021-06-15 ENCOUNTER — Telehealth: Payer: Medicare Other

## 2021-06-20 ENCOUNTER — Telehealth: Payer: Medicare Other | Admitting: Family

## 2021-06-20 DIAGNOSIS — R399 Unspecified symptoms and signs involving the genitourinary system: Secondary | ICD-10-CM | POA: Diagnosis not present

## 2021-06-20 MED ORDER — CEPHALEXIN 500 MG PO CAPS: 500.0000 mg | ORAL_CAPSULE | Freq: Two times a day (BID) | ORAL | 0 refills | Status: DC

## 2021-06-20 NOTE — Progress Notes (Signed)
Virtual Visit Consent   Kelli Howard, you are scheduled for a virtual visit with a [facility] provider today.     Just as with appointments in the office, your consent must be obtained to participate.  Your consent will be active for this visit and any virtual visit you may have with one of our providers in the next 365 days.     If you have a MyChart account, a copy of this consent can be sent to you electronically.  All virtual visits are billed to your insurance company just like a traditional visit in the office.    As this is a virtual visit, video technology does not allow for your provider to perform a traditional examination.  This may limit your provider's ability to fully assess your condition.  If your provider identifies any concerns that need to be evaluated in person or the need to arrange testing (such as labs, EKG, etc.), we will make arrangements to do so.     Although advances in technology are sophisticated, we cannot ensure that it will always work on either your end or our end.  If the connection with a video visit is poor, the visit may have to be switched to a telephone visit.  With either a video or telephone visit, we are not always able to ensure that we have a secure connection.     I need to obtain your verbal consent now.   Are you willing to proceed with your visit today?    Kelli Howard has provided verbal consent on 06/20/2021 for a virtual visit (video or telephone).   Kelli Dun, FNP   Date: 06/20/2021 10:25 AM   Virtual Visit via Video Note   I, Kelli Howard, connected with  Kelli Howard  ([id number], June 03, 1990) on 06/20/21 at 10:15 AM EST by a video-enabled telemedicine application and verified that I am speaking with the correct person using two identifiers.  Location: Patient: Virtual Visit Location Patient: Home Provider: Virtual Visit Location Provider: Home Office   I discussed the limitations of evaluation and management by  telemedicine and the availability of in person appointments. The patient expressed understanding and agreed to proceed.    History of Present Illness: Kelli Howard is a 31 y.o. who identifies as a female who was assigned female at birth, and is being seen today for uti symptoms.   HPI: Dysuria  This is a new problem. The current episode started in the past 7 days. The problem occurs every urination. The problem has been gradually worsening. Quality: pressure. The pain is at a severity of 5/10. The pain is mild. Associated symptoms include frequency, hesitancy, nausea and urgency. Pertinent negatives include no flank pain, hematuria or vomiting. She has tried increased fluids for the symptoms. The treatment provided no relief.   Problems:  Patient Active Problem List   Diagnosis Date Noted   Abnormal vaginal bleeding 05/20/2018   Bipolar disorder, current episode mixed, moderate ([place]) 04/21/2018   Borderline personality disorder ([place]) 04/21/2018   Migraine without aura and with status migrainosus, not intractable 12/14/2017   Generalized anxiety disorder with panic attacks 12/14/2017    Allergies:  Allergies  Allergen Reactions   Peanuts [Peanut Oil] Anaphylaxis    Also: tree nuts    Other Rash    mushrooms   Banana     Unknown reaction    Celery Oil Itching and Rash   Shellfish Allergy Rash   Medications:  Current Outpatient  Medications:    cephALEXin (KEFLEX) 500 MG capsule, Take 1 capsule (500 mg total) by mouth 2 (two) times daily., Disp: 14 capsule, Rfl: 0   albuterol (VENTOLIN HFA) 108 (90 Base) MCG/ACT inhaler, Inhale 2 puffs into the lungs every 6 (six) hours as needed for wheezing or shortness of breath., Disp: 18 g, Rfl: 5   aluminum chloride (DRYSOL) 20 % external solution, APPLY TO AFFECTED AREA EVERY DAY AT BEDTIME, Disp: 35 mL, Rfl: 5   amphetamine-dextroamphetamine (ADDERALL) 15 MG tablet, TAKE 1 TABLET TWICE A DAILY WITH FOOD, Disp: , Rfl:    busPIRone (BUSPAR)  30 MG tablet, Take 1 tablet (30 mg total) by mouth 2 (two) times daily as needed (anxiety)., Disp: 30 tablet, Rfl: 3   cetirizine (ZYRTEC) 10 MG tablet, Take 1 tablet (10 mg total) by mouth daily., Disp: 30 tablet, Rfl: 11   clindamycin-benzoyl peroxide (BENZACLIN) gel, APPLY TOPICALLY TO FACE TWICE A DAY, Disp: 50 g, Rfl: 0   EPINEPHrine 0.3 mg/0.3 mL IJ SOAJ injection, INJECT CONTENTS OF 1 INJECTOR INTO THE MUSCLE AS NEEDED FOR ANAPHYLAXIS, Disp: 2 each, Rfl: 2   fluticasone-salmeterol (ADVAIR HFA) 230-21 MCG/ACT inhaler, Inhale 2 puffs into the lungs 2 (two) times daily., Disp: 1 each, Rfl: 12   hydroquinone 4 % cream, APPLY TO AFFECTED AREA TWICE A DAY, Disp: 28.35 g, Rfl: 5   montelukast (SINGULAIR) 10 MG tablet, Take 1 tablet (10 mg total) by mouth at bedtime., Disp: 30 tablet, Rfl: 11   nitrofurantoin, macrocrystal-monohydrate, (MACROBID) 100 MG capsule, Take 1 capsule (100 mg total) by mouth 2 (two) times daily. 1 po BId, Disp: 14 capsule, Rfl: 0   QUEtiapine (SEROQUEL) 100 MG tablet, Take 1 tablet (100 mg total) by mouth at bedtime., Disp: 30 tablet, Rfl: 3   traZODone (DESYREL) 100 MG tablet, TAKE 1 TABLETS (100 MG TOTAL) BY MOUTH AT BEDTIME., Disp: 30 tablet, Rfl: 3   tretinoin (RETIN-A) 0.05 % cream, Apply topically at bedtime., Disp: 45 g, Rfl: 0   venlafaxine XR (EFFEXOR-XR) 75 MG 24 hr capsule, TAKE 1 CAPSULE BY MOUTH TWICE A DAY, Disp: 60 capsule, Rfl: 3  Observations/Objective: Patient is well-developed, well-nourished in no acute distress.  Resting comfortably  at home.  Head is normocephalic, atraumatic.  No labored breathing.  Speech is clear and coherent with logical content.  Patient is alert and oriented at baseline.    Assessment and Plan: 1. UTI symptoms  Force fluids AZO over the counter X2 days Follow up face to face if symptoms worsen or do not improve   Follow Up Instructions: I discussed the assessment and treatment plan with the patient. The patient was  provided an opportunity to ask questions and all were answered. The patient agreed with the plan and demonstrated an understanding of the instructions.  A copy of instructions were sent to the patient via MyChart unless otherwise noted below.     The patient was advised to call back or seek an in-person evaluation if the symptoms worsen or if the condition fails to improve as anticipated.  Time:  I spent 8 minutes with the patient via telehealth technology discussing the above problems/concerns.    Kelli Dun, FNP

## 2021-06-20 NOTE — Patient Instructions (Signed)
Urinary Tract Infection, Adult A urinary tract infection (UTI) is an infection of any part of the urinary tract. The urinary tract includes the kidneys, ureters, bladder, and urethra. These organs make, store, and get rid of urine in the body. An upper UTI affects the ureters and kidneys. A lower UTI affects the bladder and urethra. What are the causes? Most urinary tract infections are caused by bacteria in your genital area around your urethra, where urine leaves your body. These bacteria grow and cause inflammation of your urinary tract. What increases the risk? You are more likely to develop this condition if: You have a urinary catheter that stays in place. You are not able to control when you urinate or have a bowel movement (incontinence). You are female and you: Use a spermicide or diaphragm for birth control. Have low estrogen levels. Are pregnant. You have certain genes that increase your risk. You are sexually active. You take antibiotic medicines. You have a condition that causes your flow of urine to slow down, such as: An enlarged prostate, if you are female. Blockage in your urethra. A kidney stone. A nerve condition that affects your bladder control (neurogenic bladder). Not getting enough to drink, or not urinating often. You have certain medical conditions, such as: Diabetes. A weak disease-fighting system (immunesystem). Sickle cell disease. Gout. Spinal cord injury. What are the signs or symptoms? Symptoms of this condition include: Needing to urinate right away (urgency). Frequent urination. This may include small amounts of urine each time you urinate. Pain or burning with urination. Blood in the urine. Urine that smells bad or unusual. Trouble urinating. Cloudy urine. Vaginal discharge, if you are female. Pain in the abdomen or the lower back. You may also have: Vomiting or a decreased appetite. Confusion. Irritability or tiredness. A fever or  chills. Diarrhea. The first symptom in older adults may be confusion. In some cases, they may not have any symptoms until the infection has worsened. How is this diagnosed? This condition is diagnosed based on your medical history and a physical exam. You may also have other tests, including: Urine tests. Blood tests. Tests for STIs (sexually transmitted infections). If you have had more than one UTI, a cystoscopy or imaging studies may be done to determine the cause of the infections. How is this treated? Treatment for this condition includes: Antibiotic medicine. Over-the-counter medicines to treat discomfort. Drinking enough water to stay hydrated. If you have frequent infections or have other conditions such as a kidney stone, you may need to see a health care provider who specializes in the urinary tract (urologist). In rare cases, urinary tract infections can cause sepsis. Sepsis is a life-threatening condition that occurs when the body responds to an infection. Sepsis is treated in the hospital with IV antibiotics, fluids, and other medicines. Follow these instructions at home: Medicines Take over-the-counter and prescription medicines only as told by your health care provider. If you were prescribed an antibiotic medicine, take it as told by your health care provider. Do not stop using the antibiotic even if you start to feel better. General instructions Make sure you: Empty your bladder often and completely. Do not hold urine for long periods of time. Empty your bladder after sex. Wipe from front to back after urinating or having a bowel movement if you are female. Use each tissue only one time when you wipe. Drink enough fluid to keep your urine pale yellow. Keep all follow-up visits. This is important. Contact a health care provider   if: Your symptoms do not get better after 1-2 days. Your symptoms go away and then return. Get help right away if: You have severe pain in your  back or your lower abdomen. You have a fever or chills. You have nausea or vomiting. Summary A urinary tract infection (UTI) is an infection of any part of the urinary tract, which includes the kidneys, ureters, bladder, and urethra. Most urinary tract infections are caused by bacteria in your genital area. Treatment for this condition often includes antibiotic medicines. If you were prescribed an antibiotic medicine, take it as told by your health care provider. Do not stop using the antibiotic even if you start to feel better. Keep all follow-up visits. This is important. This information is not intended to replace advice given to you by your health care provider. Make sure you discuss any questions you have with your health care provider. Document Revised: 12/03/2019 Document Reviewed: 12/03/2019 Elsevier Patient Education  2022 [employer]  

## 2021-07-06 ENCOUNTER — Encounter: Payer: Self-pay | Admitting: Physician Assistant

## 2021-07-06 ENCOUNTER — Telehealth: Payer: Medicare Other

## 2021-07-06 ENCOUNTER — Telehealth: Payer: Medicare Other | Admitting: Physician Assistant

## 2021-07-06 DIAGNOSIS — G43009 Migraine without aura, not intractable, without status migrainosus: Secondary | ICD-10-CM | POA: Diagnosis not present

## 2021-07-06 DIAGNOSIS — G44209 Tension-type headache, unspecified, not intractable: Secondary | ICD-10-CM | POA: Diagnosis not present

## 2021-07-06 MED ORDER — TIZANIDINE HCL 4 MG PO TABS
4.0000 mg | ORAL_TABLET | Freq: Four times a day (QID) | ORAL | 0 refills | Status: AC | PRN
Start: 2021-07-06 — End: ?

## 2021-07-06 MED ORDER — NURTEC 75 MG PO TBDP: 1.0000 | ORAL_TABLET | Freq: Every day | ORAL | 0 refills | Status: AC | PRN

## 2021-07-06 MED ORDER — TIZANIDINE HCL 4 MG PO TABS
4.0000 mg | ORAL_TABLET | Freq: Four times a day (QID) | ORAL | 0 refills | Status: DC | PRN
Start: 2021-07-06 — End: 2021-07-06

## 2021-07-06 MED ORDER — PREDNISONE 10 MG (21) PO TBPK: ORAL_TABLET | ORAL | 0 refills | Status: DC

## 2021-07-06 MED ORDER — NURTEC 75 MG PO TBDP: 1.0000 | ORAL_TABLET | Freq: Every day | ORAL | 0 refills | Status: DC | PRN

## 2021-07-06 NOTE — Progress Notes (Signed)
Virtual Visit Consent   JAMESHIA HAYASHIDA, you are scheduled for a virtual visit with a [facility] provider today.     Just as with appointments in the office, your consent must be obtained to participate.  Your consent will be active for this visit and any virtual visit you may have with one of our providers in the next 365 days.     If you have a MyChart account, a copy of this consent can be sent to you electronically.  All virtual visits are billed to your insurance company just like a traditional visit in the office.    As this is a virtual visit, video technology does not allow for your provider to perform a traditional examination.  This may limit your provider's ability to fully assess your condition.  If your provider identifies any concerns that need to be evaluated in person or the need to arrange testing (such as labs, EKG, etc.), we will make arrangements to do so.     Although advances in technology are sophisticated, we cannot ensure that it will always work on either your end or our end.  If the connection with a video visit is poor, the visit may have to be switched to a telephone visit.  With either a video or telephone visit, we are not always able to ensure that we have a secure connection.     I need to obtain your verbal consent now.   Are you willing to proceed with your visit today?    BRIELLAH BAIK has provided verbal consent on 07/06/2021 for a virtual visit (video or telephone).   [name], PA-C   Date: 07/06/2021 12:25 PM   Virtual Visit via Video Note   I, [name], connected with  AMBERLI RUEGG  ([id number], 1990-07-13) on 07/06/21 at 12:15 PM EST by a video-enabled telemedicine application and verified that I am speaking with the correct person using two identifiers.  Location: Patient: Virtual Visit Location Patient: Home Provider: Virtual Visit Location Provider: Home Office   I discussed the limitations of evaluation and  management by telemedicine and the availability of in person appointments. The patient expressed understanding and agreed to proceed.    History of Present Illness: Kelli Howard is a 31 y.o. who identifies as a female who was assigned female at birth, and is being seen today for migraines.  HPI: Migraine  This is a new problem. The current episode started more than 1 month ago. The problem occurs intermittently (couple of times a week). The problem has been unchanged. The pain is located in the Frontal and vertex region. The pain radiates to the right neck and left neck. The pain quality is similar to prior headaches. The quality of the pain is described as aching and throbbing. The pain is moderate. Associated symptoms include nausea (occasionally with the bad ones) and photophobia (flourescent lights worsen). Pertinent negatives include no anorexia, blurred vision, dizziness, drainage, eye redness, eye watering, fever, loss of balance, phonophobia, scalp tenderness, sinus pressure, sore throat, tinnitus or visual change. Treatments tried: Has Nurtec but not working; tylenol. The treatment provided no relief. Her past medical history is significant for migraine headaches.     Problems:  Patient Active Problem List   Diagnosis Date Noted   Abnormal vaginal bleeding 05/20/2018   Bipolar disorder, current episode mixed, moderate (HCC) 04/21/2018   Borderline personality disorder (HCC) 04/21/2018   Migraine without aura and with status migrainosus, not intractable  12/14/2017   Generalized anxiety disorder with panic attacks 12/14/2017    Allergies:  Allergies  Allergen Reactions   Peanuts [Peanut Oil] Anaphylaxis    Also: tree nuts    Other Rash    mushrooms   Banana     Unknown reaction    Celery Oil Itching and Rash   Shellfish Allergy Rash   Medications:  Current Outpatient Medications:    predniSONE (STERAPRED UNI-PAK 21 TAB) 10 MG (21) TBPK tablet, 6 day taper; take as  directed on package instructions, Disp: 21 tablet, Rfl: 0   Rimegepant Sulfate (NURTEC) 75 MG TBDP, Take 1 tablet by mouth daily as needed (migraine)., Disp: 8 tablet, Rfl: 0   tiZANidine (ZANAFLEX) 4 MG tablet, Take 1 tablet (4 mg total) by mouth every 6 (six) hours as needed for muscle spasms., Disp: 30 tablet, Rfl: 0   albuterol (VENTOLIN HFA) 108 (90 Base) MCG/ACT inhaler, Inhale 2 puffs into the lungs every 6 (six) hours as needed for wheezing or shortness of breath., Disp: 18 g, Rfl: 5   aluminum chloride (DRYSOL) 20 % external solution, APPLY TO AFFECTED AREA EVERY DAY AT BEDTIME, Disp: 35 mL, Rfl: 5   amphetamine-dextroamphetamine (ADDERALL) 15 MG tablet, TAKE 1 TABLET TWICE A DAILY WITH FOOD, Disp: , Rfl:    busPIRone (BUSPAR) 30 MG tablet, Take 1 tablet (30 mg total) by mouth 2 (two) times daily as needed (anxiety)., Disp: 30 tablet, Rfl: 3   cephALEXin (KEFLEX) 500 MG capsule, Take 1 capsule (500 mg total) by mouth 2 (two) times daily., Disp: 14 capsule, Rfl: 0   cetirizine (ZYRTEC) 10 MG tablet, Take 1 tablet (10 mg total) by mouth daily., Disp: 30 tablet, Rfl: 11   clindamycin-benzoyl peroxide (BENZACLIN) gel, APPLY TOPICALLY TO FACE TWICE A DAY, Disp: 50 g, Rfl: 0   EPINEPHrine 0.3 mg/0.3 mL IJ SOAJ injection, INJECT CONTENTS OF 1 INJECTOR INTO THE MUSCLE AS NEEDED FOR ANAPHYLAXIS, Disp: 2 each, Rfl: 2   fluticasone-salmeterol (ADVAIR HFA) 230-21 MCG/ACT inhaler, Inhale 2 puffs into the lungs 2 (two) times daily., Disp: 1 each, Rfl: 12   hydroquinone 4 % cream, APPLY TO AFFECTED AREA TWICE A DAY, Disp: 28.35 g, Rfl: 5   montelukast (SINGULAIR) 10 MG tablet, Take 1 tablet (10 mg total) by mouth at bedtime., Disp: 30 tablet, Rfl: 11   nitrofurantoin, macrocrystal-monohydrate, (MACROBID) 100 MG capsule, Take 1 capsule (100 mg total) by mouth 2 (two) times daily. 1 po BId, Disp: 14 capsule, Rfl: 0   QUEtiapine (SEROQUEL) 100 MG tablet, Take 1 tablet (100 mg total) by mouth at bedtime., Disp:  30 tablet, Rfl: 3   traZODone (DESYREL) 100 MG tablet, TAKE 1 TABLETS (100 MG TOTAL) BY MOUTH AT BEDTIME., Disp: 30 tablet, Rfl: 3   tretinoin (RETIN-A) 0.05 % cream, Apply topically at bedtime., Disp: 45 g, Rfl: 0   venlafaxine XR (EFFEXOR-XR) 75 MG 24 hr capsule, TAKE 1 CAPSULE BY MOUTH TWICE A DAY, Disp: 60 capsule, Rfl: 3  Observations/Objective: Patient is well-developed, well-nourished in no acute distress.  Resting comfortably at home.  Head is normocephalic, atraumatic.  No labored breathing.  Speech is clear and coherent with logical content.  Patient is alert and oriented at baseline.    Assessment and Plan: 1. Migraine without aura and without status migrainosus, not intractable - tiZANidine (ZANAFLEX) 4 MG tablet; Take 1 tablet (4 mg total) by mouth every 6 (six) hours as needed for muscle spasms.  Dispense: 30 tablet; Refill: 0 -  Rimegepant Sulfate (NURTEC) 75 MG TBDP; Take 1 tablet by mouth daily as needed (migraine).  Dispense: 8 tablet; Refill: 0 - predniSONE (STERAPRED UNI-PAK 21 TAB) 10 MG (21) TBPK tablet; 6 day taper; take as directed on package instructions  Dispense: 21 tablet; Refill: 0  2. Tension headache - tiZANidine (ZANAFLEX) 4 MG tablet; Take 1 tablet (4 mg total) by mouth every 6 (six) hours as needed for muscle spasms.  Dispense: 30 tablet; Refill: 0 - predniSONE (STERAPRED UNI-PAK 21 TAB) 10 MG (21) TBPK tablet; 6 day taper; take as directed on package instructions  Dispense: 21 tablet; Refill: 0  - Suspect migraine with tension headache - Will give prednisone and tizanidine for tension headache - Refilled her Nurtec short term - Follow up with PCP to discuss Nurtec not working and recurrence of migraines - Seek urgent in person evaluation if headache worsens  Follow Up Instructions: I discussed the assessment and treatment plan with the patient. The patient was provided an opportunity to ask questions and all were answered. The patient agreed with the  plan and demonstrated an understanding of the instructions.  A copy of instructions were sent to the patient via MyChart unless otherwise noted below.   The patient was advised to call back or seek an in-person evaluation if the symptoms worsen or if the condition fails to improve as anticipated.  Time:  I spent 15 minutes with the patient via telehealth technology discussing the above problems/concerns.    [name], PA-C

## 2021-07-06 NOTE — Addendum Note (Signed)
Addended by: [name] on: 07/06/2021 04:24 PM ? ? Modules accepted: Orders ? ?

## 2021-07-06 NOTE — Patient Instructions (Signed)
Kelli Howard, thank you for joining Kelli Loveless, PA-C for today's virtual visit.  While this provider is not your primary care provider (PCP), if your PCP is located in our provider database this encounter information will be shared with them immediately following your visit.  Consent: (Patient) Kelli Howard provided verbal consent for this virtual visit at the beginning of the encounter.  Current Medications:  Current Outpatient Medications:    predniSONE (STERAPRED UNI-PAK 21 TAB) 10 MG (21) TBPK tablet, 6 day taper; take as directed on package instructions, Disp: 21 tablet, Rfl: 0   Rimegepant Sulfate (NURTEC) 75 MG TBDP, Take 1 tablet by mouth daily as needed (migraine)., Disp: 8 tablet, Rfl: 0   tiZANidine (ZANAFLEX) 4 MG tablet, Take 1 tablet (4 mg total) by mouth every 6 (six) hours as needed for muscle spasms., Disp: 30 tablet, Rfl: 0   albuterol (VENTOLIN HFA) 108 (90 Base) MCG/ACT inhaler, Inhale 2 puffs into the lungs every 6 (six) hours as needed for wheezing or shortness of breath., Disp: 18 g, Rfl: 5   aluminum chloride (DRYSOL) 20 % external solution, APPLY TO AFFECTED AREA EVERY DAY AT BEDTIME, Disp: 35 mL, Rfl: 5   amphetamine-dextroamphetamine (ADDERALL) 15 MG tablet, TAKE 1 TABLET TWICE A DAILY WITH FOOD, Disp: , Rfl:    busPIRone (BUSPAR) 30 MG tablet, Take 1 tablet (30 mg total) by mouth 2 (two) times daily as needed (anxiety)., Disp: 30 tablet, Rfl: 3   cephALEXin (KEFLEX) 500 MG capsule, Take 1 capsule (500 mg total) by mouth 2 (two) times daily., Disp: 14 capsule, Rfl: 0   cetirizine (ZYRTEC) 10 MG tablet, Take 1 tablet (10 mg total) by mouth daily., Disp: 30 tablet, Rfl: 11   clindamycin-benzoyl peroxide (BENZACLIN) gel, APPLY TOPICALLY TO FACE TWICE A DAY, Disp: 50 g, Rfl: 0   EPINEPHrine 0.3 mg/0.3 mL IJ SOAJ injection, INJECT CONTENTS OF 1 INJECTOR INTO THE MUSCLE AS NEEDED FOR ANAPHYLAXIS, Disp: 2 each, Rfl: 2   fluticasone-salmeterol (ADVAIR HFA)  230-21 MCG/ACT inhaler, Inhale 2 puffs into the lungs 2 (two) times daily., Disp: 1 each, Rfl: 12   hydroquinone 4 % cream, APPLY TO AFFECTED AREA TWICE A DAY, Disp: 28.35 g, Rfl: 5   montelukast (SINGULAIR) 10 MG tablet, Take 1 tablet (10 mg total) by mouth at bedtime., Disp: 30 tablet, Rfl: 11   nitrofurantoin, macrocrystal-monohydrate, (MACROBID) 100 MG capsule, Take 1 capsule (100 mg total) by mouth 2 (two) times daily. 1 po BId, Disp: 14 capsule, Rfl: 0   QUEtiapine (SEROQUEL) 100 MG tablet, Take 1 tablet (100 mg total) by mouth at bedtime., Disp: 30 tablet, Rfl: 3   traZODone (DESYREL) 100 MG tablet, TAKE 1 TABLETS (100 MG TOTAL) BY MOUTH AT BEDTIME., Disp: 30 tablet, Rfl: 3   tretinoin (RETIN-A) 0.05 % cream, Apply topically at bedtime., Disp: 45 g, Rfl: 0   venlafaxine XR (EFFEXOR-XR) 75 MG 24 hr capsule, TAKE 1 CAPSULE BY MOUTH TWICE A DAY, Disp: 60 capsule, Rfl: 3   Medications ordered in this encounter:  Meds ordered this encounter  Medications   tiZANidine (ZANAFLEX) 4 MG tablet    Sig: Take 1 tablet (4 mg total) by mouth every 6 (six) hours as needed for muscle spasms.    Dispense:  30 tablet    Refill:  0    Order Specific Question:   Supervising Provider    Answer:   MILLER, BRIAN [[id number]]   Rimegepant Sulfate (NURTEC) 75 MG TBDP  Sig: Take 1 tablet by mouth daily as needed (migraine).    Dispense:  8 tablet    Refill:  0    Order Specific Question:   Supervising Provider    Answer:   MILLER, BRIAN [[id number]]   predniSONE (STERAPRED UNI-PAK 21 TAB) 10 MG (21) TBPK tablet    Sig: 6 day taper; take as directed on package instructions    Dispense:  21 tablet    Refill:  0    Order Specific Question:   Supervising Provider    Answer:   Hyacinth Meeker, BRIAN [[id number]]     *If you need refills on other medications prior to your next appointment, please contact your pharmacy*  Follow-Up: Call back or seek an in-person evaluation if the symptoms worsen or if the condition fails to improve  as anticipated.  Other Instructions Tension Headache, Adult A tension headache is a feeling of pain, pressure, or aching over the front and sides of the head. The pain can be dull, or it can feel tight. There are two types of tension headache: Episodic tension headache. This is when the headaches happen fewer than 15 days a month. Chronic tension headache. This is when the headaches happen more than 15 days a month during a 39-month period. A tension headache can last from 30 minutes to several days. It is the most common kind of headache. Tension headaches are not normally associated with nausea or vomiting, and they do not get worse with physical activity. What are the causes? The exact cause of this condition is not known. Tension headaches are often triggered by stress, anxiety, or depression. Other triggers may include: Alcohol. Too much caffeine or caffeine withdrawal. Respiratory infections, such as colds, flu, or sinus infections. Dental problems or teeth clenching. Fatigue. Holding your head and neck in the same position for a long period of time, such as while using a computer. Smoking. Arthritis of the neck. What are the signs or symptoms? Symptoms of this condition include: A feeling of pressure or tightness around the head. Dull, aching head pain. Pain over the front and sides of the head. Tenderness in the muscles of the head, neck, and shoulders. How is this diagnosed? This condition may be diagnosed based on your symptoms, your medical history, and a physical exam. If your symptoms are severe or unusual, you may have imaging tests, such as a CT scan or an MRI of your head. Your vision may also be checked. How is this treated? This condition may be treated with lifestyle changes and with medicines that help relieve symptoms. Follow these instructions at home: Managing pain Take over-the-counter and prescription medicines only as told by your health care provider. When  you have a headache, lie down in a dark, quiet room. If directed, put ice on your head and neck. To do this: Put ice in a plastic bag. Place a towel between your skin and the bag. Leave the ice on for 20 minutes, 2-3 times a day. Remove the ice if your skin turns bright red. This is very important. If you cannot feel pain, heat, or cold, you have a greater risk of damage to the area. If directed, apply heat to the back of your neck as often as told by your health care provider. Use the heat source that your health care provider recommends, such as a moist heat pack or a heating pad. Place a towel between your skin and the heat source. Leave the heat on for 20-30 minutes.  Remove the heat if your skin turns bright red. This is especially important if you are unable to feel pain, heat, or cold. You have a greater risk of getting burned. Eating and drinking Eat meals on a regular schedule. If you drink alcohol: Limit how much you have to: 0-1 drink a day for women who are not pregnant. 0-2 drinks a day for men. Know how much alcohol is in your drink. In the [place] one drink equals one 12 oz bottle of beer (355 mL), one 5 oz glass of wine (148 mL), or one 1 oz glass of hard liquor (44 mL). Drink enough fluid to keep your urine pale yellow. Decrease your caffeine intake, or stop using caffeine. Lifestyle Get 7-9 hours of sleep each night, or get the amount of sleep recommended by your health care provider. At bedtime, remove computers, phones, and tablets from your room. Find ways to manage your stress. This may include: Exercise. Deep breathing exercises. Yoga. Listening to music. Positive mental imagery. Try to sit up straight and avoid tensing your muscles. Do not use any products that contain nicotine or tobacco. These include cigarettes, chewing tobacco, and vaping devices, such as e-cigarettes. If you need help quitting, ask your health care provider. General instructions  Avoid any  headache triggers. Keep a journal to help find out what may trigger your headaches. For example, write down: What you eat and drink. How much sleep you get. Any change to your diet or medicines. Keep all follow-up visits. This is important. Contact a health care provider if: Your headache does not get better. Your headache comes back. You are sensitive to sounds, light, or smells because of a headache. You have nausea or you vomit. Your stomach hurts. Get help right away if: You suddenly develop a severe headache, along with any of the following: A stiff neck. Nausea and vomiting. Confusion. Weakness in one part or one side of your body. Double vision or loss of vision. Shortness of breath. Rash. Unusual sleepiness. Fever or chills. Trouble speaking. Pain in your eye or ear. Trouble walking or balancing. Feeling faint or passing out. Summary A tension headache is a feeling of pain, pressure, or aching over the front and sides of the head. A tension headache can last from 30 minutes to several days. It is the most common kind of headache. This condition may be diagnosed based on your symptoms, your medical history, and a physical exam. This condition may be treated with lifestyle changes and with medicines that help relieve symptoms. This information is not intended to replace advice given to you by your health care provider. Make sure you discuss any questions you have with your health care provider. Document Revised: 01/20/2020 Document Reviewed: 01/20/2020 Elsevier Patient Education  2022 [employer]   Migraine Headache A migraine headache is an intense, throbbing pain on one side or both sides of the head. Migraine headaches may also cause other symptoms, such as nausea, vomiting, and sensitivity to light and noise. A migraine headache can last from 4 hours to 3 days. Talk with your doctor about what things may bring on (trigger) your migraine headaches. What are the  causes? The exact cause of this condition is not known. However, a migraine may be caused when nerves in the brain become irritated and release chemicals that cause inflammation of blood vessels. This inflammation causes pain. This condition may be triggered or caused by: Drinking alcohol. Smoking. Taking medicines, such as: Medicine used to treat chest  pain (nitroglycerin). Birth control pills. Estrogen. Certain blood pressure medicines. Eating or drinking products that contain nitrates, glutamate, aspartame, or tyramine. Aged cheeses, chocolate, or caffeine may also be triggers. Doing physical activity. Other things that may trigger a migraine headache include: Menstruation. Pregnancy. Hunger. Stress. Lack of sleep or too much sleep. Weather changes. Fatigue. What increases the risk? The following factors may make you more likely to experience migraine headaches: Being a certain age. This condition is more common in people who are 4325-29 years old. Being female. Having a family history of migraine headaches. Being Caucasian. Having a mental health condition, such as depression or anxiety. Being obese. What are the signs or symptoms? The main symptom of this condition is pulsating or throbbing pain. This pain may: Happen in any area of the head, such as on one side or both sides. Interfere with daily activities. Get worse with physical activity. Get worse with exposure to bright lights or loud noises. Other symptoms may include: Nausea. Vomiting. Dizziness. General sensitivity to bright lights, loud noises, or smells. Before you get a migraine headache, you may get warning signs (an aura). An aura may include: Seeing flashing lights or having blind spots. Seeing bright spots, halos, or zigzag lines. Having tunnel vision or blurred vision. Having numbness or a tingling feeling. Having trouble talking. Having muscle weakness. Some people have symptoms after a migraine  headache (postdromal phase), such as: Feeling tired. Difficulty concentrating. How is this diagnosed? A migraine headache can be diagnosed based on: Your symptoms. A physical exam. Tests, such as: CT scan or an MRI of the head. These imaging tests can help rule out other causes of headaches. Taking fluid from the spine (lumbar puncture) and analyzing it (cerebrospinal fluid analysis, or CSF analysis). How is this treated? This condition may be treated with medicines that: Relieve pain. Relieve nausea. Prevent migraine headaches. Treatment for this condition may also include: Acupuncture. Lifestyle changes like avoiding foods that trigger migraine headaches. Biofeedback. Cognitive behavioral therapy. Follow these instructions at home: Medicines Take over-the-counter and prescription medicines only as told by your health care provider. Ask your health care provider if the medicine prescribed to you: Requires you to avoid driving or using heavy machinery. Can cause constipation. You may need to take these actions to prevent or treat constipation: Drink enough fluid to keep your urine pale yellow. Take over-the-counter or prescription medicines. Eat foods that are high in fiber, such as beans, whole grains, and fresh fruits and vegetables. Limit foods that are high in fat and processed sugars, such as fried or sweet foods. Lifestyle Do not drink alcohol. Do not use any products that contain nicotine or tobacco, such as cigarettes, e-cigarettes, and chewing tobacco. If you need help quitting, ask your health care provider. Get at least 8 hours of sleep every night. Find ways to manage stress, such as meditation, deep breathing, or yoga. General instructions   Keep a journal to find out what may trigger your migraine headaches. For example, write down: What you eat and drink. How much sleep you get. Any change to your diet or medicines. If you have a migraine headache: Avoid  things that make your symptoms worse, such as bright lights. It may help to lie down in a dark, quiet room. Do not drive or use heavy machinery. Ask your health care provider what activities are safe for you while you are experiencing symptoms. Keep all follow-up visits as told by your health care provider. This is important. Contact  a health care provider if: You develop symptoms that are different or more severe than your usual migraine headache symptoms. You have more than 15 headache days in one month. Get help right away if: Your migraine headache becomes severe. Your migraine headache lasts longer than 72 hours. You have a fever. You have a stiff neck. You have vision loss. Your muscles feel weak or like you cannot control them. You start to lose your balance often. You have trouble walking. You faint. You have a seizure. Summary A migraine headache is an intense, throbbing pain on one side or both sides of the head. Migraines may also cause other symptoms, such as nausea, vomiting, and sensitivity to light and noise. This condition may be treated with medicines and lifestyle changes. You may also need to avoid certain things that trigger a migraine headache. Keep a journal to find out what may trigger your migraine headaches. Contact your health care provider if you have more than 15 headache days in a month or you develop symptoms that are different or more severe than your usual migraine headache symptoms. This information is not intended to replace advice given to you by your health care provider. Make sure you discuss any questions you have with your health care provider. Document Revised: 08/14/2018 Document Reviewed: 06/04/2018 Elsevier Patient Education  2022 [employer].    If you have been instructed to have an in-person evaluation today at a local Urgent Care facility, please use the link below. It will take you to a list of all of our available [facility], including address, phone number and hours of operation. Please do not delay care.  [facility]  If you or a family member do not have a primary care provider, use the link below to schedule a visit and establish care. When you choose a [facility] primary care physician or advanced practice provider, you gain a long-term partner in health. Find a Primary Care Provider  Learn more about [facility] in-office and virtual care options: [facility] - Get Care Now

## 2021-08-03 ENCOUNTER — Telehealth: Payer: Medicare Other

## 2021-08-09 ENCOUNTER — Telehealth: Payer: Medicare Other | Admitting: Physician Assistant

## 2021-08-09 DIAGNOSIS — J019 Acute sinusitis, unspecified: Secondary | ICD-10-CM

## 2021-08-09 DIAGNOSIS — B9689 Other specified bacterial agents as the cause of diseases classified elsewhere: Secondary | ICD-10-CM

## 2021-08-09 MED ORDER — FLUTICASONE PROPIONATE 50 MCG/ACT NA SUSP: 2.0000 | Freq: Every day | NASAL | 0 refills | Status: DC

## 2021-08-09 MED ORDER — AMOXICILLIN-POT CLAVULANATE 875-125 MG PO TABS: 1.0000 | ORAL_TABLET | Freq: Two times a day (BID) | ORAL | 0 refills | Status: DC

## 2021-08-09 NOTE — Patient Instructions (Signed)
Kelli BartersJasmine D Howard, thank you for joining Piedad ClimesWilliam Cody Dardan Shelton, PA-C for today's virtual visit.  While this provider is not your primary care provider (PCP), if your PCP is located in our provider database this encounter information will be shared with them immediately following your visit. ? ?Consent: ?(Patient) Kelli BartersJasmine D Howard provided verbal consent for this virtual visit at the beginning of the encounter. ? ?Current Medications: ? ?Current Outpatient Medications:  ?  albuterol (VENTOLIN HFA) 108 (90 Base) MCG/ACT inhaler, Inhale 2 puffs into the lungs every 6 (six) hours as needed for wheezing or shortness of breath., Disp: 18 g, Rfl: 5 ?  aluminum chloride (DRYSOL) 20 % external solution, APPLY TO AFFECTED AREA EVERY DAY AT BEDTIME, Disp: 35 mL, Rfl: 5 ?  amphetamine-dextroamphetamine (ADDERALL) 15 MG tablet, TAKE 1 TABLET TWICE A DAILY WITH FOOD, Disp: , Rfl:  ?  busPIRone (BUSPAR) 30 MG tablet, Take 1 tablet (30 mg total) by mouth 2 (two) times daily as needed (anxiety)., Disp: 30 tablet, Rfl: 3 ?  cetirizine (ZYRTEC) 10 MG tablet, Take 1 tablet (10 mg total) by mouth daily., Disp: 30 tablet, Rfl: 11 ?  clindamycin-benzoyl peroxide (BENZACLIN) gel, APPLY TOPICALLY TO FACE TWICE A DAY, Disp: 50 g, Rfl: 0 ?  EPINEPHrine 0.3 mg/0.3 mL IJ SOAJ injection, INJECT CONTENTS OF 1 INJECTOR INTO THE MUSCLE AS NEEDED FOR ANAPHYLAXIS, Disp: 2 each, Rfl: 2 ?  fluticasone-salmeterol (ADVAIR HFA) 230-21 MCG/ACT inhaler, Inhale 2 puffs into the lungs 2 (two) times daily., Disp: 1 each, Rfl: 12 ?  hydroquinone 4 % cream, APPLY TO AFFECTED AREA TWICE A DAY, Disp: 28.35 g, Rfl: 5 ?  montelukast (SINGULAIR) 10 MG tablet, Take 1 tablet (10 mg total) by mouth at bedtime., Disp: 30 tablet, Rfl: 11 ?  QUEtiapine (SEROQUEL) 100 MG tablet, Take 1 tablet (100 mg total) by mouth at bedtime., Disp: 30 tablet, Rfl: 3 ?  Rimegepant Sulfate (NURTEC) 75 MG TBDP, Take 1 tablet by mouth daily as needed (migraine)., Disp: 8 tablet, Rfl: 0 ?   tiZANidine (ZANAFLEX) 4 MG tablet, Take 1 tablet (4 mg total) by mouth every 6 (six) hours as needed for muscle spasms., Disp: 30 tablet, Rfl: 0 ?  traZODone (DESYREL) 100 MG tablet, TAKE 1 TABLETS (100 MG TOTAL) BY MOUTH AT BEDTIME., Disp: 30 tablet, Rfl: 3 ?  tretinoin (RETIN-A) 0.05 % cream, Apply topically at bedtime., Disp: 45 g, Rfl: 0 ?  venlafaxine XR (EFFEXOR-XR) 75 MG 24 hr capsule, TAKE 1 CAPSULE BY MOUTH TWICE A DAY, Disp: 60 capsule, Rfl: 3  ? ?Medications ordered in this encounter:  ?No orders of the defined types were placed in this encounter. ?  ? ?*If you need refills on other medications prior to your next appointment, please contact your pharmacy* ? ?Follow-Up: ?Call back or seek an in-person evaluation if the symptoms worsen or if the condition fails to improve as anticipated. ? ?Other Instructions ?Please take antibiotic as directed.  Increase fluid intake.  Use Saline nasal spray.  Take a daily multivitamin. Use the Flonase as directed.  Place a humidifier in the bedroom.  Please call or return clinic if symptoms are not improving. ? ?Sinusitis ?Sinusitis is redness, soreness, and swelling (inflammation) of the paranasal sinuses. Paranasal sinuses are air pockets within the bones of your face (beneath the eyes, the middle of the forehead, or above the eyes). In healthy paranasal sinuses, mucus is able to drain out, and air is able to circulate through them by way of  your nose. However, when your paranasal sinuses are inflamed, mucus and air can become trapped. This can allow bacteria and other germs to grow and cause infection. ?Sinusitis can develop quickly and last only a short time (acute) or continue over a long period (chronic). Sinusitis that lasts for more than 12 weeks is considered chronic.  ?CAUSES  ?Causes of sinusitis include: ?Allergies. ?Structural abnormalities, such as displacement of the cartilage that separates your nostrils (deviated septum), which can decrease the air flow  through your nose and sinuses and affect sinus drainage. ?Functional abnormalities, such as when the small hairs (cilia) that line your sinuses and help remove mucus do not work properly or are not present. ?SYMPTOMS  ?Symptoms of acute and chronic sinusitis are the same. The primary symptoms are pain and pressure around the affected sinuses. Other symptoms include: ?Upper toothache. ?Earache. ?Headache. ?Bad breath. ?Decreased sense of smell and taste. ?A cough, which worsens when you are lying flat. ?Fatigue. ?Fever. ?Thick drainage from your nose, which often is green and may contain pus (purulent). ?Swelling and warmth over the affected sinuses. ?DIAGNOSIS  ?Your caregiver will perform a physical exam. During the exam, your caregiver may: ?Look in your nose for signs of abnormal growths in your nostrils (nasal polyps). ?Tap over the affected sinus to check for signs of infection. ?View the inside of your sinuses (endoscopy) with a special imaging device with a light attached (endoscope), which is inserted into your sinuses. ?If your caregiver suspects that you have chronic sinusitis, one or more of the following tests may be recommended: ?Allergy tests. ?Nasal culture A sample of mucus is taken from your nose and sent to a lab and screened for bacteria. ?Nasal cytology A sample of mucus is taken from your nose and examined by your caregiver to determine if your sinusitis is related to an allergy. ?TREATMENT  ?Most cases of acute sinusitis are related to a viral infection and will resolve on their own within 10 days. Sometimes medicines are prescribed to help relieve symptoms (pain medicine, decongestants, nasal steroid sprays, or saline sprays).  ?However, for sinusitis related to a bacterial infection, your caregiver will prescribe antibiotic medicines. These are medicines that will help kill the bacteria causing the infection.  ?Rarely, sinusitis is caused by a fungal infection. In theses cases, your caregiver  will prescribe antifungal medicine. ?For some cases of chronic sinusitis, surgery is needed. Generally, these are cases in which sinusitis recurs more than 3 times per year, despite other treatments. ?HOME CARE INSTRUCTIONS  ?Drink plenty of water. Water helps thin the mucus so your sinuses can drain more easily. ?Use a humidifier. ?Inhale steam 3 to 4 times a day (for example, sit in the bathroom with the shower running). ?Apply a warm, moist washcloth to your face 3 to 4 times a day, or as directed by your caregiver. ?Use saline nasal sprays to help moisten and clean your sinuses. ?Take over-the-counter or prescription medicines for pain, discomfort, or fever only as directed by your caregiver. ?SEEK IMMEDIATE MEDICAL CARE IF: ?You have increasing pain or severe headaches. ?You have nausea, vomiting, or drowsiness. ?You have swelling around your face. ?You have vision problems. ?You have a stiff neck. ?You have difficulty breathing. ?MAKE SURE YOU:  ?Understand these instructions. ?Will watch your condition. ?Will get help right away if you are not doing well or get worse. ?Document Released: 04/22/2005 Document Revised: 07/15/2011 Document Reviewed: 05/07/2011 ?ExitCare? Patient Information ?2014 [place], [place]. ? ? ?If you have been  instructed to have an in-person evaluation today at a local Urgent Care facility, please use the link below. It will take you to a list of all of our available [facility], including address, phone number and hours of operation. Please do not delay care.  ?[facility] ? ?If you or a family member do not have a primary care provider, use the link below to schedule a visit and establish care. When you choose a [facility] primary care physician or advanced practice provider, you gain a long-term partner in health. ?Find a Primary Care Provider ? ?Learn more about [facility]'s in-office and virtual care options: ? - Get Care Now  ?

## 2021-08-09 NOTE — Progress Notes (Signed)
?Virtual Visit Consent  ? ?Kelli Howard, you are scheduled for a virtual visit with a [facility] provider today.   ?  ?Just as with appointments in the office, your consent must be obtained to participate.  Your consent will be active for this visit and any virtual visit you may have with one of our providers in the next 365 days.   ?  ?If you have a MyChart account, a copy of this consent can be sent to you electronically.  All virtual visits are billed to your insurance company just like a traditional visit in the office.   ? ?As this is a virtual visit, video technology does not allow for your provider to perform a traditional examination.  This may limit your provider's ability to fully assess your condition.  If your provider identifies any concerns that need to be evaluated in person or the need to arrange testing (such as labs, EKG, etc.), we will make arrangements to do so.   ?  ?Although advances in technology are sophisticated, we cannot ensure that it will always work on either your end or our end.  If the connection with a video visit is poor, the visit may have to be switched to a telephone visit.  With either a video or telephone visit, we are not always able to ensure that we have a secure connection.    ? ?I need to obtain your verbal consent now.   Are you willing to proceed with your visit today?  ?  ?Kelli BartersJasmine D Howard has provided verbal consent on 08/09/2021 for a virtual visit (video or telephone). ?  ?Piedad ClimesWilliam Cody Keante Urizar, PA-C  ? ?Date: 08/09/2021 8:53 AM ? ? ?Virtual Visit via Video Note  ? ?Piedad Climes, Kelli Howard, connected with  Kelli Howard  ([id number], [birth year]) on 08/09/21 at  8:45 AM EDT by a video-enabled telemedicine application and verified that I am speaking with the correct person using two identifiers. ? ?Location: ?Patient: Virtual Visit Location Patient: Home ?Provider: Virtual Visit Location Provider: Home Office ?  ?I discussed the limitations of evaluation and  management by telemedicine and the availability of in person appointments. The patient expressed understanding and agreed to proceed.   ? ?History of Present Illness: ?Kelli Howard is a 31 y.o. who identifies as a female who was assigned female at birth, and is being seen today for URI symptoms that have been present x 1+ weeks. Started with nasal and head congestion and cough. Notes being seen at a local UC at onset of symptoms, testing negative for flu, COVID, RSV. Symptoms have continued to progress since that time and now with bilateral ear pain, sinus pain, headache. Hearing occasionally muffled. Denies tinnitus. Denies fever, chills. Has been taking OTC Tylenol Cold and Sinus.  ? ?HPI: HPI  ?Problems:  ?Patient Active Problem List  ? Diagnosis Date Noted  ? Abnormal vaginal bleeding 05/20/2018  ? Bipolar disorder, current episode mixed, moderate (HCC) 04/21/2018  ? Borderline personality disorder (HCC) 04/21/2018  ? Migraine without aura and with status migrainosus, not intractable 12/14/2017  ? Generalized anxiety disorder with panic attacks 12/14/2017  ?  ?Allergies:  ?Allergies  ?Allergen Reactions  ? Peanuts [Peanut Oil] Anaphylaxis  ?  Also: tree nuts ?  ? Other Rash  ?  mushrooms  ? Banana   ?  Unknown reaction   ? Celery Oil Itching and Rash  ? Shellfish Allergy Rash  ? ?Medications:  ?Current Outpatient Medications:  ?  amoxicillin-clavulanate (AUGMENTIN) 875-125 MG tablet, Take 1 tablet by mouth 2 (two) times daily., Disp: 14 tablet, Rfl: 0 ?  fluticasone (FLONASE) 50 MCG/ACT nasal spray, Place 2 sprays into both nostrils daily., Disp: 16 g, Rfl: 0 ?  albuterol (VENTOLIN HFA) 108 (90 Base) MCG/ACT inhaler, Inhale 2 puffs into the lungs every 6 (six) hours as needed for wheezing or shortness of breath., Disp: 18 g, Rfl: 5 ?  aluminum chloride (DRYSOL) 20 % external solution, APPLY TO AFFECTED AREA EVERY DAY AT BEDTIME, Disp: 35 mL, Rfl: 5 ?  amphetamine-dextroamphetamine (ADDERALL) 15 MG tablet,  TAKE 1 TABLET TWICE A DAILY WITH FOOD, Disp: , Rfl:  ?  busPIRone (BUSPAR) 30 MG tablet, Take 1 tablet (30 mg total) by mouth 2 (two) times daily as needed (anxiety)., Disp: 30 tablet, Rfl: 3 ?  cetirizine (ZYRTEC) 10 MG tablet, Take 1 tablet (10 mg total) by mouth daily., Disp: 30 tablet, Rfl: 11 ?  clindamycin-benzoyl peroxide (BENZACLIN) gel, APPLY TOPICALLY TO FACE TWICE A DAY, Disp: 50 g, Rfl: 0 ?  EPINEPHrine 0.3 mg/0.3 mL IJ SOAJ injection, INJECT CONTENTS OF 1 INJECTOR INTO THE MUSCLE AS NEEDED FOR ANAPHYLAXIS, Disp: 2 each, Rfl: 2 ?  fluticasone-salmeterol (ADVAIR HFA) 230-21 MCG/ACT inhaler, Inhale 2 puffs into the lungs 2 (two) times daily., Disp: 1 each, Rfl: 12 ?  hydroquinone 4 % cream, APPLY TO AFFECTED AREA TWICE A DAY, Disp: 28.35 g, Rfl: 5 ?  montelukast (SINGULAIR) 10 MG tablet, Take 1 tablet (10 mg total) by mouth at bedtime., Disp: 30 tablet, Rfl: 11 ?  QUEtiapine (SEROQUEL) 100 MG tablet, Take 1 tablet (100 mg total) by mouth at bedtime., Disp: 30 tablet, Rfl: 3 ?  Rimegepant Sulfate (NURTEC) 75 MG TBDP, Take 1 tablet by mouth daily as needed (migraine)., Disp: 8 tablet, Rfl: 0 ?  tiZANidine (ZANAFLEX) 4 MG tablet, Take 1 tablet (4 mg total) by mouth every 6 (six) hours as needed for muscle spasms., Disp: 30 tablet, Rfl: 0 ?  traZODone (DESYREL) 100 MG tablet, TAKE 1 TABLETS (100 MG TOTAL) BY MOUTH AT BEDTIME., Disp: 30 tablet, Rfl: 3 ?  tretinoin (RETIN-A) 0.05 % cream, Apply topically at bedtime., Disp: 45 g, Rfl: 0 ?  venlafaxine XR (EFFEXOR-XR) 75 MG 24 hr capsule, TAKE 1 CAPSULE BY MOUTH TWICE A DAY, Disp: 60 capsule, Rfl: 3 ? ?Observations/Objective: ?Patient is well-developed, well-nourished in no acute distress.  ?Resting comfortably at home.  ?Head is normocephalic, atraumatic.  ?No labored breathing. ?Speech is clear and coherent with logical content.  ?Patient is alert and oriented at baseline.  ? ?Assessment and Plan: ?1. Acute bacterial sinusitis ?- fluticasone (FLONASE) 50 MCG/ACT  nasal spray; Place 2 sprays into both nostrils daily.  Dispense: 16 g; Refill: 0 ?- amoxicillin-clavulanate (AUGMENTIN) 875-125 MG tablet; Take 1 tablet by mouth 2 (two) times daily.  Dispense: 14 tablet; Refill: 0 ? ?With possible ear infections. Rx Augmentin.  Increase fluids.  Rest.  Saline nasal spray.  Probiotic.  Mucinex as directed.  Continue allergy regimen. Humidifier in bedroom. Flonase per orders.  Call or return to clinic if symptoms are not improving. ? ? ?Follow Up Instructions: ?I discussed the assessment and treatment plan with the patient. The patient was provided an opportunity to ask questions and all were answered. The patient agreed with the plan and demonstrated an understanding of the instructions.  A copy of instructions were sent to the patient via MyChart unless otherwise noted below.  ? ?The patient was advised  to call back or seek an in-person evaluation if the symptoms worsen or if the condition fails to improve as anticipated. ? ?Time:  ?I spent 10 minutes with the patient via telehealth technology discussing the above problems/concerns.   ? ?Piedad Climes, PA-C ?

## 2021-08-21 ENCOUNTER — Telehealth: Payer: Medicare Other | Admitting: Family Medicine

## 2021-08-21 DIAGNOSIS — Z7184 Encounter for health counseling related to travel: Secondary | ICD-10-CM

## 2021-08-21 NOTE — Progress Notes (Signed)
[place]  ? ?Recommended to follow up with travel clinic. ? ?Patient acknowledged agreement and understanding of the plan.  ? ?

## 2022-01-23 ENCOUNTER — Telehealth: Payer: Medicare Other | Admitting: Physician Assistant

## 2022-01-23 DIAGNOSIS — R112 Nausea with vomiting, unspecified: Secondary | ICD-10-CM | POA: Diagnosis not present

## 2022-01-23 MED ORDER — ONDANSETRON 4 MG PO TBDP: 4.0000 mg | ORAL_TABLET | Freq: Three times a day (TID) | ORAL | 0 refills | Status: DC | PRN

## 2022-01-23 NOTE — Progress Notes (Signed)

## 2022-02-01 ENCOUNTER — Telehealth: Payer: Medicare Other | Admitting: Physician Assistant

## 2022-02-01 DIAGNOSIS — R3989 Other symptoms and signs involving the genitourinary system: Secondary | ICD-10-CM

## 2022-02-01 MED ORDER — CEPHALEXIN 500 MG PO CAPS: 500.0000 mg | ORAL_CAPSULE | Freq: Two times a day (BID) | ORAL | 0 refills | Status: DC

## 2022-02-01 NOTE — Progress Notes (Signed)

## 2022-02-06 ENCOUNTER — Telehealth: Payer: Medicare Other | Admitting: Physician Assistant

## 2022-02-06 DIAGNOSIS — J069 Acute upper respiratory infection, unspecified: Secondary | ICD-10-CM

## 2022-02-06 MED ORDER — AZELASTINE HCL 0.1 % NA SOLN: 1.0000 | Freq: Two times a day (BID) | NASAL | 0 refills | Status: AC

## 2022-02-06 MED ORDER — BENZONATATE 100 MG PO CAPS: 100.0000 mg | ORAL_CAPSULE | Freq: Three times a day (TID) | ORAL | 0 refills | Status: DC | PRN

## 2022-02-06 MED ORDER — PSEUDOEPH-BROMPHEN-DM 30-2-10 MG/5ML PO SYRP: 5.0000 mL | ORAL_SOLUTION | Freq: Four times a day (QID) | ORAL | 0 refills | Status: DC | PRN

## 2022-02-06 NOTE — Progress Notes (Signed)
E-Visit for Upper Respiratory Infection   We are sorry you are not feeling well.  Here is how we plan to help!  Based on what you have shared with me, it looks like you may have a viral upper respiratory infection.  Upper respiratory infections are caused by a large number of viruses; however, rhinovirus is the most common cause.   Symptoms vary from person to person, with common symptoms including sore throat, cough, fatigue or lack of energy and feeling of general discomfort.  A low-grade fever of up to 100.4 may present, but is often uncommon.  Symptoms vary however, and are closely related to a person's age or underlying illnesses.  The most common symptoms associated with an upper respiratory infection are nasal discharge or congestion, cough, sneezing, headache and pressure in the ears and face.  These symptoms usually persist for about 3 to 10 days, but can last up to 2 weeks.  It is important to know that upper respiratory infections do not cause serious illness or complications in most cases.    Upper respiratory infections can be transmitted from person to person, with the most common method of transmission being a person's hands.  The virus is able to live on the skin and can infect other persons for up to 2 hours after direct contact.  Also, these can be transmitted when someone coughs or sneezes; thus, it is important to cover the mouth to reduce this risk.  To keep the spread of the illness at [place], good hand hygiene is very important.  This is an infection that is most likely caused by a virus. There are no specific treatments other than to help you with the symptoms until the infection runs its course.  We are sorry you are not feeling well.  Here is how we plan to help!   For nasal congestion, you may use an oral decongestants such as Mucinex D or if you have glaucoma or high blood pressure use plain Mucinex.  Saline nasal spray or nasal drops can help and can safely be used as often as  needed for congestion.  For your congestion, I have prescribed Azelastine nasal spray two sprays in each nostril twice a day  If you do not have a history of heart disease, hypertension, diabetes or thyroid disease, prostate/bladder issues or glaucoma, you may also use Sudafed to treat nasal congestion.  It is highly recommended that you consult with a pharmacist or your primary care physician to ensure this medication is safe for you to take.     If you have a cough, you may use cough suppressants such as Delsym and Robitussin.  If you have glaucoma or high blood pressure, you can also use Coricidin HBP.   For cough I have prescribed for you A prescription cough medication called Tessalon Perles 100 mg. You may take 1-2 capsules every 8 hours as needed for cough and Bromfed DM cough syrup Take 97mL every 6 hours as needed for cough. It is okay to combine with the tessalon (benzonatate) perles.  If you have a sore or scratchy throat, use a saltwater gargle-  to  teaspoon of salt dissolved in a 4-ounce to 8-ounce glass of warm water.  Gargle the solution for approximately 15-30 seconds and then spit.  It is important not to swallow the solution.  You can also use throat lozenges/cough drops and Chloraseptic spray to help with throat pain or discomfort.  Warm or cold liquids can also be helpful  in relieving throat pain.  For headache, pain or general discomfort, you can use Ibuprofen or Tylenol as directed.   Some authorities believe that zinc sprays or the use of Echinacea may shorten the course of your symptoms.  I do highly recommend to take a Covid 19 test as it has been circulating rapidly again.   HOME CARE Only take medications as instructed by your medical team. Be sure to drink plenty of fluids. Water is fine as well as fruit juices, sodas and electrolyte beverages. You may want to stay away from caffeine or alcohol. If you are nauseated, try taking small sips of liquids. How do you know if  you are getting enough fluid? Your urine should be a pale yellow or almost colorless. Get rest. Taking a steamy shower or using a humidifier may help nasal congestion and ease sore throat pain. You can place a towel over your head and breathe in the steam from hot water coming from a faucet. Using a saline nasal spray works much the same way. Cough drops, hard candies and sore throat lozenges may ease your cough. Avoid close contacts especially the very young and the elderly Cover your mouth if you cough or sneeze Always remember to wash your hands.   GET HELP RIGHT AWAY IF: You develop worsening fever. If your symptoms do not improve within 10 days You develop yellow or green discharge from your nose over 3 days. You have coughing fits You develop a severe head ache or visual changes. You develop shortness of breath, difficulty breathing or start having chest pain Your symptoms persist after you have completed your treatment plan  MAKE SURE YOU  Understand these instructions. Will watch your condition. Will get help right away if you are not doing well or get worse.  Thank you for choosing an e-visit.  Your e-visit answers were reviewed by a board certified advanced clinical practitioner to complete your personal care plan. Depending upon the condition, your plan could have included both over the counter or prescription medications.  Please review your pharmacy choice. Make sure the pharmacy is open so you can pick up prescription now. If there is a problem, you may contact your provider through [employer] and have the prescription routed to another pharmacy.  Your safety is important to [place]. If you have drug allergies check your prescription carefully.   For the next 24 hours you can use MyChart to ask questions about today's visit, request a non-urgent call back, or ask for a work or school excuse. You will get an email in the next two days asking about your experience. I hope  that your e-visit has been valuable and will speed your recovery.  I provided 5 minutes of non face-to-face time during this encounter for chart review and documentation.

## 2022-02-15 ENCOUNTER — Telehealth: Payer: Medicare Other | Admitting: Physician Assistant

## 2022-02-15 DIAGNOSIS — B9689 Other specified bacterial agents as the cause of diseases classified elsewhere: Secondary | ICD-10-CM | POA: Diagnosis not present

## 2022-02-15 DIAGNOSIS — J019 Acute sinusitis, unspecified: Secondary | ICD-10-CM

## 2022-02-15 DIAGNOSIS — H66003 Acute suppurative otitis media without spontaneous rupture of ear drum, bilateral: Secondary | ICD-10-CM | POA: Diagnosis not present

## 2022-02-15 MED ORDER — AMOXICILLIN-POT CLAVULANATE 875-125 MG PO TABS: 1.0000 | ORAL_TABLET | Freq: Two times a day (BID) | ORAL | 0 refills | Status: DC

## 2022-02-15 MED ORDER — NEOMYCIN-POLYMYXIN-HC 3.5-10000-1 OT SOLN: 3.0000 [drp] | Freq: Four times a day (QID) | OTIC | 0 refills | Status: DC

## 2022-02-15 NOTE — Progress Notes (Signed)
Virtual Visit Consent   [name], you are scheduled for a virtual visit with a [place] provider today. Just as with appointments in the office, your consent must be obtained to participate. Your consent will be active for this visit and any virtual visit you may have with one of our providers in the next 365 days. If you have a MyChart account, a copy of this consent can be sent to you electronically.  As this is a virtual visit, video technology does not allow for your provider to perform a traditional examination. This may limit your provider's ability to fully assess your condition. If your provider identifies any concerns that need to be evaluated in person or the need to arrange testing (such as labs, EKG, etc.), we will make arrangements to do so. Although advances in technology are sophisticated, we cannot ensure that it will always work on either your end or our end. If the connection with a video visit is poor, the visit may have to be switched to a telephone visit. With either a video or telephone visit, we are not always able to ensure that we have a secure connection.  By engaging in this virtual visit, you consent to the provision of healthcare and authorize for your insurance to be billed (if applicable) for the services provided during this visit. Depending on your insurance coverage, you may receive a charge related to this service.  I need to obtain your verbal consent now. Are you willing to proceed with your visit today? LIZ PINHO has provided verbal consent on 02/15/2022 for a virtual visit (video or telephone). [name], PA-C  Date: 02/15/2022 8:36 AM  Virtual Visit via Video Note   I, [name], connected with  Kelli Howard  ([id number], 1990/12/02) on 02/15/22 at  8:30 AM EDT by a video-enabled telemedicine application and verified that I am speaking with the correct person using two identifiers.  Location: Patient: Virtual Visit  Location Patient: Home Provider: Virtual Visit Location Provider: Home Office   I discussed the limitations of evaluation and management by telemedicine and the availability of in person appointments. The patient expressed understanding and agreed to proceed.    History of Present Illness: Kelli Howard is a 31 y.o. who identifies as a female who was assigned female at birth, and is being seen today for otalgia.  HPI: Otalgia  There is pain in both (R>L) ears. This is a new problem. The problem occurs constantly. There has been no fever (not since initial sickness back around 02/06/22; highest was 103). The pain is moderate. Associated symptoms include ear discharge (left), headaches, hearing loss (right; muffled), rhinorrhea and a sore throat. Pertinent negatives include no coughing. Treatments tried: mucinex. The treatment provided no relief. There is no history of a chronic ear infection, hearing loss or a tympanostomy tube.    Problems:  Patient Active Problem List   Diagnosis Date Noted   Abnormal vaginal bleeding 05/20/2018   Bipolar disorder, current episode mixed, moderate (HCC) 04/21/2018   Borderline personality disorder (HCC) 04/21/2018   Migraine without aura and with status migrainosus, not intractable 12/14/2017   Generalized anxiety disorder with panic attacks 12/14/2017    Allergies:  Allergies  Allergen Reactions   Peanuts [Peanut Oil] Anaphylaxis    Also: tree nuts    Other Rash    mushrooms   Banana     Unknown reaction    Celery Oil Itching and Rash   Shellfish  Allergy Rash   Medications:  Current Outpatient Medications:    amoxicillin-clavulanate (AUGMENTIN) 875-125 MG tablet, Take 1 tablet by mouth 2 (two) times daily., Disp: 14 tablet, Rfl: 0   neomycin-polymyxin-hydrocortisone (CORTISPORIN) OTIC solution, Place 3 drops into both ears 4 (four) times daily. X 7 days, Disp: 10 mL, Rfl: 0   albuterol (VENTOLIN HFA) 108 (90 Base) MCG/ACT inhaler, Inhale 2  puffs into the lungs every 6 (six) hours as needed for wheezing or shortness of breath., Disp: 18 g, Rfl: 5   aluminum chloride (DRYSOL) 20 % external solution, APPLY TO AFFECTED AREA EVERY DAY AT BEDTIME, Disp: 35 mL, Rfl: 5   amphetamine-dextroamphetamine (ADDERALL) 15 MG tablet, TAKE 1 TABLET TWICE A DAILY WITH FOOD, Disp: , Rfl:    azelastine (ASTELIN) 0.1 % nasal spray, Place 1 spray into both nostrils 2 (two) times daily. Use in each nostril as directed, Disp: 30 mL, Rfl: 0   benzonatate (TESSALON) 100 MG capsule, Take 1 capsule (100 mg total) by mouth 3 (three) times daily as needed., Disp: 30 capsule, Rfl: 0   brompheniramine-pseudoephedrine-DM 30-2-10 MG/5ML syrup, Take 5 mLs by mouth 4 (four) times daily as needed., Disp: 120 mL, Rfl: 0   busPIRone (BUSPAR) 30 MG tablet, Take 1 tablet (30 mg total) by mouth 2 (two) times daily as needed (anxiety)., Disp: 30 tablet, Rfl: 3   cetirizine (ZYRTEC) 10 MG tablet, Take 1 tablet (10 mg total) by mouth daily., Disp: 30 tablet, Rfl: 11   clindamycin-benzoyl peroxide (BENZACLIN) gel, APPLY TOPICALLY TO FACE TWICE A DAY, Disp: 50 g, Rfl: 0   EPINEPHrine 0.3 mg/0.3 mL IJ SOAJ injection, INJECT CONTENTS OF 1 INJECTOR INTO THE MUSCLE AS NEEDED FOR ANAPHYLAXIS, Disp: 2 each, Rfl: 2   fluticasone-salmeterol (ADVAIR HFA) 230-21 MCG/ACT inhaler, Inhale 2 puffs into the lungs 2 (two) times daily., Disp: 1 each, Rfl: 12   hydroquinone 4 % cream, APPLY TO AFFECTED AREA TWICE A DAY, Disp: 28.35 g, Rfl: 5   montelukast (SINGULAIR) 10 MG tablet, Take 1 tablet (10 mg total) by mouth at bedtime., Disp: 30 tablet, Rfl: 11   ondansetron (ZOFRAN-ODT) 4 MG disintegrating tablet, Take 1 tablet (4 mg total) by mouth every 8 (eight) hours as needed for nausea or vomiting., Disp: 20 tablet, Rfl: 0   QUEtiapine (SEROQUEL) 100 MG tablet, Take 1 tablet (100 mg total) by mouth at bedtime., Disp: 30 tablet, Rfl: 3   Rimegepant Sulfate (NURTEC) 75 MG TBDP, Take 1 tablet by mouth  daily as needed (migraine)., Disp: 8 tablet, Rfl: 0   tiZANidine (ZANAFLEX) 4 MG tablet, Take 1 tablet (4 mg total) by mouth every 6 (six) hours as needed for muscle spasms., Disp: 30 tablet, Rfl: 0   traZODone (DESYREL) 100 MG tablet, TAKE 1 TABLETS (100 MG TOTAL) BY MOUTH AT BEDTIME., Disp: 30 tablet, Rfl: 3   tretinoin (RETIN-A) 0.05 % cream, Apply topically at bedtime., Disp: 45 g, Rfl: 0   venlafaxine XR (EFFEXOR-XR) 75 MG 24 hr capsule, TAKE 1 CAPSULE BY MOUTH TWICE A DAY, Disp: 60 capsule, Rfl: 3  Observations/Objective: Patient is well-developed, well-nourished in no acute distress.  Resting comfortably at home.  Head is normocephalic, atraumatic.  No labored breathing.  Speech is clear and coherent with logical content.  Patient is alert and oriented at baseline.    Assessment and Plan: 1. Non-recurrent acute suppurative otitis media of both ears without spontaneous rupture of tympanic membranes - amoxicillin-clavulanate (AUGMENTIN) 875-125 MG tablet; Take 1 tablet by  mouth 2 (two) times daily.  Dispense: 14 tablet; Refill: 0 - neomycin-polymyxin-hydrocortisone (CORTISPORIN) OTIC solution; Place 3 drops into both ears 4 (four) times daily. X 7 days  Dispense: 10 mL; Refill: 0  2. Acute bacterial sinusitis - amoxicillin-clavulanate (AUGMENTIN) 875-125 MG tablet; Take 1 tablet by mouth 2 (two) times daily.  Dispense: 14 tablet; Refill: 0  - Worsening symptoms that have not responded to OTC medications.  - Will give Augmentin and Cortisporin ear drops - Continue allergy medications.  - Steam and humidifier can help - Stay well hydrated and get plenty of rest.  - Seek in person evaluation if no symptom improvement or if symptoms worsen   Follow Up Instructions: I discussed the assessment and treatment plan with the patient. The patient was provided an opportunity to ask questions and all were answered. The patient agreed with the plan and demonstrated an understanding of the  instructions.  A copy of instructions were sent to the patient via MyChart unless otherwise noted below.    The patient was advised to call back or seek an in-person evaluation if the symptoms worsen or if the condition fails to improve as anticipated.  Time:  I spent 11 minutes with the patient via telehealth technology discussing the above problems/concerns.    [name], PA-C

## 2022-02-15 NOTE — Patient Instructions (Signed)
Kelli Howard, thank you for joining Mar Daring, PA-C for today's virtual visit.  While this provider is not your primary care provider (PCP), if your PCP is located in our provider database this encounter information will be shared with them immediately following your visit.  Consent: (Patient) Kelli Howard provided verbal consent for this virtual visit at the beginning of the encounter.  Current Medications:  Current Outpatient Medications:    amoxicillin-clavulanate (AUGMENTIN) 875-125 MG tablet, Take 1 tablet by mouth 2 (two) times daily., Disp: 14 tablet, Rfl: 0   neomycin-polymyxin-hydrocortisone (CORTISPORIN) OTIC solution, Place 3 drops into both ears 4 (four) times daily. X 7 days, Disp: 10 mL, Rfl: 0   albuterol (VENTOLIN HFA) 108 (90 Base) MCG/ACT inhaler, Inhale 2 puffs into the lungs every 6 (six) hours as needed for wheezing or shortness of breath., Disp: 18 g, Rfl: 5   aluminum chloride (DRYSOL) 20 % external solution, APPLY TO AFFECTED AREA EVERY DAY AT BEDTIME, Disp: 35 mL, Rfl: 5   amphetamine-dextroamphetamine (ADDERALL) 15 MG tablet, TAKE 1 TABLET TWICE A DAILY WITH FOOD, Disp: , Rfl:    azelastine (ASTELIN) 0.1 % nasal spray, Place 1 spray into both nostrils 2 (two) times daily. Use in each nostril as directed, Disp: 30 mL, Rfl: 0   benzonatate (TESSALON) 100 MG capsule, Take 1 capsule (100 mg total) by mouth 3 (three) times daily as needed., Disp: 30 capsule, Rfl: 0   brompheniramine-pseudoephedrine-DM 30-2-10 MG/5ML syrup, Take 5 mLs by mouth 4 (four) times daily as needed., Disp: 120 mL, Rfl: 0   busPIRone (BUSPAR) 30 MG tablet, Take 1 tablet (30 mg total) by mouth 2 (two) times daily as needed (anxiety)., Disp: 30 tablet, Rfl: 3   cetirizine (ZYRTEC) 10 MG tablet, Take 1 tablet (10 mg total) by mouth daily., Disp: 30 tablet, Rfl: 11   clindamycin-benzoyl peroxide (BENZACLIN) gel, APPLY TOPICALLY TO FACE TWICE A DAY, Disp: 50 g, Rfl: 0   EPINEPHrine 0.3  mg/0.3 mL IJ SOAJ injection, INJECT CONTENTS OF 1 INJECTOR INTO THE MUSCLE AS NEEDED FOR ANAPHYLAXIS, Disp: 2 each, Rfl: 2   fluticasone-salmeterol (ADVAIR HFA) 230-21 MCG/ACT inhaler, Inhale 2 puffs into the lungs 2 (two) times daily., Disp: 1 each, Rfl: 12   hydroquinone 4 % cream, APPLY TO AFFECTED AREA TWICE A DAY, Disp: 28.35 g, Rfl: 5   montelukast (SINGULAIR) 10 MG tablet, Take 1 tablet (10 mg total) by mouth at bedtime., Disp: 30 tablet, Rfl: 11   ondansetron (ZOFRAN-ODT) 4 MG disintegrating tablet, Take 1 tablet (4 mg total) by mouth every 8 (eight) hours as needed for nausea or vomiting., Disp: 20 tablet, Rfl: 0   QUEtiapine (SEROQUEL) 100 MG tablet, Take 1 tablet (100 mg total) by mouth at bedtime., Disp: 30 tablet, Rfl: 3   Rimegepant Sulfate (NURTEC) 75 MG TBDP, Take 1 tablet by mouth daily as needed (migraine)., Disp: 8 tablet, Rfl: 0   tiZANidine (ZANAFLEX) 4 MG tablet, Take 1 tablet (4 mg total) by mouth every 6 (six) hours as needed for muscle spasms., Disp: 30 tablet, Rfl: 0   traZODone (DESYREL) 100 MG tablet, TAKE 1 TABLETS (100 MG TOTAL) BY MOUTH AT BEDTIME., Disp: 30 tablet, Rfl: 3   tretinoin (RETIN-A) 0.05 % cream, Apply topically at bedtime., Disp: 45 g, Rfl: 0   venlafaxine XR (EFFEXOR-XR) 75 MG 24 hr capsule, TAKE 1 CAPSULE BY MOUTH TWICE A DAY, Disp: 60 capsule, Rfl: 3   Medications ordered in this encounter:  Meds  ordered this encounter  Medications   amoxicillin-clavulanate (AUGMENTIN) 875-125 MG tablet    Sig: Take 1 tablet by mouth 2 (two) times daily.    Dispense:  14 tablet    Refill:  0    Order Specific Question:   Supervising Provider    Answer:   Merrilee Jansky [id number]   neomycin-polymyxin-hydrocortisone (CORTISPORIN) OTIC solution    Sig: Place 3 drops into both ears 4 (four) times daily. X 7 days    Dispense:  10 mL    Refill:  0    Order Specific Question:   Supervising Provider    Answer:   Merrilee Jansky [[id number]]     *If you need  refills on other medications prior to your next appointment, please contact your pharmacy*  Follow-Up: Call back or seek an in-person evaluation if the symptoms worsen or if the condition fails to improve as anticipated.  [facility] [phone]  Other Instructions  Otitis Media, Adult  Otitis media is a condition in which the middle ear is red and swollen (inflamed) and full of fluid. The middle ear is the part of the ear that contains bones for hearing as well as air that helps send sounds to the brain. The condition usually goes away on its own. What are the causes? This condition is caused by a blockage in the eustachian tube. This tube connects the middle ear to the back of the nose. It normally allows air into the middle ear. The blockage is caused by fluid or swelling. Problems that can cause blockage include: A cold or infection that affects the nose, mouth, or throat. Allergies. An irritant, such as tobacco smoke. Adenoids that have become large. The adenoids are soft tissue located in the back of the throat, behind the nose and the roof of the mouth. Growth or swelling in the upper part of the throat, just behind the nose (nasopharynx). Damage to the ear caused by a change in pressure. This is called barotrauma. What increases the risk? You are more likely to develop this condition if you: Smoke or are exposed to tobacco smoke. Have an opening in the roof of your mouth (cleft palate). Have acid reflux. Have problems in your body's defense system (immune system). What are the signs or symptoms? Symptoms of this condition include: Ear pain. Fever. Problems with hearing. Being tired. Fluid leaking from the ear. Ringing in the ear. How is this treated? This condition can go away on its own within 3-5 days. But if the condition is caused by germs (bacteria) and does not go away on its own, or if it keeps coming back, your doctor may: Give you antibiotic  medicines. Give you medicines for pain. Follow these instructions at home: Take over-the-counter and prescription medicines only as told by your doctor. If you were prescribed an antibiotic medicine, take it as told by your doctor. Do not stop taking it even if you start to feel better. Keep all follow-up visits. Contact a doctor if: You have bleeding from your nose. There is a lump on your neck. You are not feeling better in 5 days. You feel worse instead of better. Get help right away if: You have pain that is not helped with medicine. You have swelling, redness, or pain around your ear. You get a stiff neck. You cannot move part of your face (paralysis). You notice that the bone behind your ear hurts when you touch it. You get a very bad  headache. Summary Otitis media means that the middle ear is red, swollen, and full of fluid. This condition usually goes away on its own. If the problem does not go away, treatment may be needed. You may be given medicines to treat the infection or to treat your pain. If you were prescribed an antibiotic medicine, take it as told by your doctor. Do not stop taking it even if you start to feel better. Keep all follow-up visits. This information is not intended to replace advice given to you by your health care provider. Make sure you discuss any questions you have with your health care provider. Document Revised: 07/31/2020 Document Reviewed: 07/31/2020 Elsevier Patient Education  2023 Elsevier Inc.    If you have been instructed to have an in-person evaluation today at a local Urgent Care facility, please use the link below. It will take you to a list of all of our available [facility], including address, phone number and hours of operation. Please do not delay care.  [facility]  If you or a family member do not have a primary care provider, use the link below to schedule a visit and establish care. When you choose a  [facility] primary care physician or advanced practice provider, you gain a long-term partner in health. Find a Primary Care Provider  Learn more about [facility]'s in-office and virtual care options: [facility] Get Care Now

## 2022-04-12 ENCOUNTER — Telehealth: Payer: Medicare Other | Admitting: Family Medicine

## 2022-04-12 DIAGNOSIS — J4541 Moderate persistent asthma with (acute) exacerbation: Secondary | ICD-10-CM

## 2022-04-12 MED ORDER — PREDNISONE 20 MG PO TABS: 40.0000 mg | ORAL_TABLET | Freq: Every day | ORAL | 0 refills | Status: AC

## 2022-04-12 NOTE — Progress Notes (Signed)
Visit for Asthma  Based on what you have shared with me, it looks like you may have a flare up of your asthma.  Asthma is a chronic (ongoing) lung disease which results in airway obstruction, inflammation and hyper-responsiveness.   Asthma symptoms vary from person to person, with common symptoms including nighttime awakening and decreased ability to participate in normal activities as a result of shortness of breath. It is often triggered by changes in weather, changes in the season, changes in air temperature, or inside (home, school, daycare or work) allergens such as animal dander, mold, mildew, woodstoves or cockroaches.   It can also be triggered by hormonal changes, extreme emotion, physical exertion or an upper respiratory tract illness.     It is important to identify the trigger, and then eliminate or avoid the trigger if possible.   If you have been prescribed medications to be taken on a regular basis, it is important to follow the asthma action plan and to follow guidelines to adjust medication in response to increasing symptoms of decreased peak expiratory flow rate  Treatment: I have prescribed: a short burst of prednisone- please also use your albuterol inhaler as I detailed:  Albuterol is good for emergency- but it is also helpful to get bronchospasm under control.    Try using that every 4-6 hours for the next 24 hours to see if that helps.   You can use it for coughing episodes, wheezing, shortness of breath and at times increased mucus production.   HOME CARE Only take medications as instructed by your medical team. Consider wearing a mask or scarf to improve breathing air temperature have been shown to decrease irritation and decrease exacerbations Get rest. Taking a steamy shower or using a humidifier may help nasal congestion sand ease sore throat pain. You can  place a towel over your head and breathe in the steam from hot water coming from a faucet. Using a saline nasal spray works much the same way.  Cough drops, hare candies and sore throat lozenges may ease your cough.  Avoid close contacts especially the very you and the elderly Cover your mouth if you cough or sneeze Always remember to wash your hands.    GET HELP RIGHT AWAY IF: You develop worsening symptoms; breathlessness at rest, drowsy, confused or agitated, unable to speak in full sentences You have coughing fits You develop a severe headache or visual changes You develop shortness of breath, difficulty breathing or start having chest pain Your symptoms persist after you have completed your treatment plan If your symptoms do not improve within 10 days  MAKE SURE YOU Understand these instructions. Will watch your condition. Will get help right away if you are not doing well or get worse.   Your e-visit answers were reviewed by a board certified advanced clinical practitioner to complete your personal care plan, Depending upon the condition, your plan could have included both over the counter or prescription medications.   Please review your pharmacy choice. Your safety is important to [place]. If you have drug allergies check your prescription carefully.  You can use MyChart to ask questions about today's visit, request a non-urgent  call back, or ask for a work or school excuse for 24 hours related to this e-Visit. If it has been greater than 24 hours you will need to follow up with your provider, or enter a new e-Visit to address those concerns.   You will get an e-mail in the next two days  asking about your experience. I hope that your e-visit has been valuable and will speed your recovery. Thank you for using e-visits.   I provided 5 minutes of non face-to-face time during this encounter for chart review, medication and order placement, as well as and documentation.

## 2022-07-10 ENCOUNTER — Telehealth: Payer: Medicare Other | Admitting: Physician Assistant

## 2022-07-10 DIAGNOSIS — J4540 Moderate persistent asthma, uncomplicated: Secondary | ICD-10-CM | POA: Diagnosis not present

## 2022-07-11 MED ORDER — PREDNISONE 20 MG PO TABS: 40.0000 mg | ORAL_TABLET | Freq: Every day | ORAL | 0 refills | Status: DC

## 2022-07-11 MED ORDER — ALBUTEROL SULFATE HFA 108 (90 BASE) MCG/ACT IN AERS: 2.0000 | INHALATION_SPRAY | Freq: Four times a day (QID) | RESPIRATORY_TRACT | 5 refills | Status: DC | PRN

## 2022-07-11 MED ORDER — FLUTICASONE-SALMETEROL 230-21 MCG/ACT IN AERO: 2.0000 | INHALATION_SPRAY | Freq: Two times a day (BID) | RESPIRATORY_TRACT | 12 refills | Status: DC

## 2022-07-11 NOTE — Progress Notes (Signed)
Visit for Asthma  Based on what you have shared with me, it looks like you may have a flare up of your asthma.  Asthma is a chronic (ongoing) lung disease which results in airway obstruction, inflammation and hyper-responsiveness.   Asthma symptoms vary from person to person, with common symptoms including nighttime awakening and decreased ability to participate in normal activities as a result of shortness of breath. It is often triggered by changes in weather, changes in the season, changes in air temperature, or inside (home, school, daycare or work) allergens such as animal dander, mold, mildew, woodstoves or cockroaches.   It can also be triggered by hormonal changes, extreme emotion, physical exertion or an upper respiratory tract illness.     It is important to identify the trigger, and then eliminate or avoid the trigger if possible.   If you have been prescribed medications to be taken on a regular basis, it is important to follow the asthma action plan and to follow guidelines to adjust medication in response to increasing symptoms of decreased peak expiratory flow rate  Treatment: I have sent in a refill of your albuterol and Advair. Please make sure to follow-up with your regular provider for ongoing management of your asthma.   HOME CARE Only take medications as instructed by your medical team. Consider wearing a mask or scarf to improve breathing air temperature have been shown to decrease irritation and decrease exacerbations Get rest. Taking a steamy shower or using a humidifier may help nasal congestion sand ease sore throat pain. You can place a towel over your head and breathe in the steam from hot water coming from a faucet. Using a saline nasal spray works much the same way.  Cough drops, hare candies and sore throat lozenges may ease your cough.  Avoid close  contacts especially the very you and the elderly Cover your mouth if you cough or sneeze Always remember to wash your hands.    GET HELP RIGHT AWAY IF: You develop worsening symptoms; breathlessness at rest, drowsy, confused or agitated, unable to speak in full sentences You have coughing fits You develop a severe headache or visual changes You develop shortness of breath, difficulty breathing or start having chest pain Your symptoms persist after you have completed your treatment plan If your symptoms do not improve within 10 days  MAKE SURE YOU Understand these instructions. Will watch your condition. Will get help right away if you are not doing well or get worse.   Your e-visit answers were reviewed by a board certified advanced clinical practitioner to complete your personal care plan, Depending upon the condition, your plan could have included both over the counter or prescription medications.   Please review your pharmacy choice. Your safety is important to [place]. If you have drug allergies check your prescription carefully.  You can use MyChart to ask questions about today's visit, request a non-urgent  call back, or ask for a work or school excuse for 24 hours related to this e-Visit. If it has been greater than 24 hours you will need to follow up with your provider, or enter a new e-Visit to address those concerns.   You will get an e-mail in the next two days asking about your experience. I hope that your e-visit has been valuable and will speed your recovery. Thank you for using e-visits.

## 2022-07-11 NOTE — Progress Notes (Signed)
I have spent 5 minutes in review of e-visit questionnaire, review and updating patient chart, medical decision making and response to patient.   Jaquia Benedicto Cody Shaelin Lalley, PA-C    

## 2022-07-11 NOTE — Addendum Note (Signed)
Addended by: Brunetta Jeans on: 07/11/2022 07:21 AM   Modules accepted: Orders

## 2022-07-25 ENCOUNTER — Telehealth: Payer: Medicare Other | Admitting: Physician Assistant

## 2022-07-25 DIAGNOSIS — R3989 Other symptoms and signs involving the genitourinary system: Secondary | ICD-10-CM | POA: Diagnosis not present

## 2022-07-25 MED ORDER — CEPHALEXIN 500 MG PO CAPS: 500.0000 mg | ORAL_CAPSULE | Freq: Two times a day (BID) | ORAL | 0 refills | Status: AC

## 2022-07-25 NOTE — Progress Notes (Signed)
I have spent 5 minutes in review of e-visit questionnaire, review and updating patient chart, medical decision making and response to patient.   Matisse Salais Cody Angellica Maddison, PA-C    

## 2022-07-25 NOTE — Progress Notes (Signed)

## 2022-08-30 ENCOUNTER — Telehealth: Payer: Medicare Other | Admitting: Physician Assistant

## 2022-08-30 DIAGNOSIS — J4541 Moderate persistent asthma with (acute) exacerbation: Secondary | ICD-10-CM

## 2022-08-30 MED ORDER — PREDNISONE 20 MG PO TABS: 40.0000 mg | ORAL_TABLET | Freq: Every day | ORAL | 0 refills | Status: DC

## 2022-08-30 NOTE — Progress Notes (Signed)
E-Visit for Asthma  Based on what you have shared with me, it looks like you may have a flare up of your asthma.  Asthma is a chronic (ongoing) lung disease which results in airway obstruction, inflammation and hyper-responsiveness.   Asthma symptoms vary from person to person, with common symptoms including nighttime awakening and decreased ability to participate in normal activities as a result of shortness of breath. It is often triggered by changes in weather, changes in the season, changes in air temperature, or inside (home, school, daycare or work) allergens such as animal dander, mold, mildew, woodstoves or cockroaches.   It can also be triggered by hormonal changes, extreme emotion, physical exertion or an upper respiratory tract illness.     It is important to identify the trigger, and then eliminate or avoid the trigger if possible.   If you have been prescribed medications to be taken on a regular basis, it is important to follow the asthma action plan and to follow guidelines to adjust medication in response to increasing symptoms of decreased peak expiratory flow rate  Treatment: I have prescribed: Prednisone 40mg by mouth per day for 5 - 7 days  HOME CARE Only take medications as instructed by your medical team. Consider wearing a mask or scarf to improve breathing air temperature have been shown to decrease irritation and decrease exacerbations Get rest. Taking a steamy shower or using a humidifier may help nasal congestion sand ease sore throat pain. You can place a towel over your head and breathe in the steam from hot water coming from a faucet. Using a saline nasal spray works much the same way.  Cough drops, hare candies and sore throat lozenges may ease your cough.  Avoid close contacts especially the very you and the elderly Cover your mouth if you cough or  sneeze Always remember to wash your hands.    GET HELP RIGHT AWAY IF: You develop worsening symptoms; breathlessness at rest, drowsy, confused or agitated, unable to speak in full sentences You have coughing fits You develop a severe headache or visual changes You develop shortness of breath, difficulty breathing or start having chest pain Your symptoms persist after you have completed your treatment plan If your symptoms do not improve within 10 days  MAKE SURE YOU Understand these instructions. Will watch your condition. Will get help right away if you are not doing well or get worse.   Your e-visit answers were reviewed by a board certified advanced clinical practitioner to complete your personal care plan, Depending upon the condition, your plan could have included both over the counter or prescription medications.   Please review your pharmacy choice. Your safety is important to us. If you have drug allergies check your prescription carefully.  You can use MyChart to ask questions about today's visit, request a non-urgent  call back, or ask for a work or school excuse for 24 hours related to this e-Visit. If it has been greater than 24 hours you will need to follow up with your provider, or enter a new e-Visit to address those concerns.   You will get an e-mail in the next two days asking about your experience. I hope that your e-visit has been valuable and will speed your recovery. Thank you for using e-visits.  I have spent 5 minutes in review of e-visit questionnaire, review and updating patient chart, medical decision making and response to patient.   Alexiana Laverdure M Zeena Starkel, PA-C  

## 2022-09-27 ENCOUNTER — Telehealth: Payer: Medicare Other | Admitting: Family

## 2022-09-27 DIAGNOSIS — R399 Unspecified symptoms and signs involving the genitourinary system: Secondary | ICD-10-CM | POA: Diagnosis not present

## 2022-09-28 MED ORDER — CEPHALEXIN 500 MG PO CAPS: 500.0000 mg | ORAL_CAPSULE | Freq: Two times a day (BID) | ORAL | 0 refills | Status: DC

## 2022-09-28 NOTE — Progress Notes (Signed)

## 2022-10-15 IMAGING — US US THYROID
2 series · 13 of 25 positions shown · non-contrast
Comparison: None.

CLINICAL DATA: Goiter.

EXAM:
THYROID ULTRASOUND
TECHNIQUE: Ultrasound examination of the thyroid gland and adjacent soft
tissues was performed.

[Series 1: us thyroid · 0.08mm/px · 12 of 44 slices shown (1 of 2)]
[im 1/44]
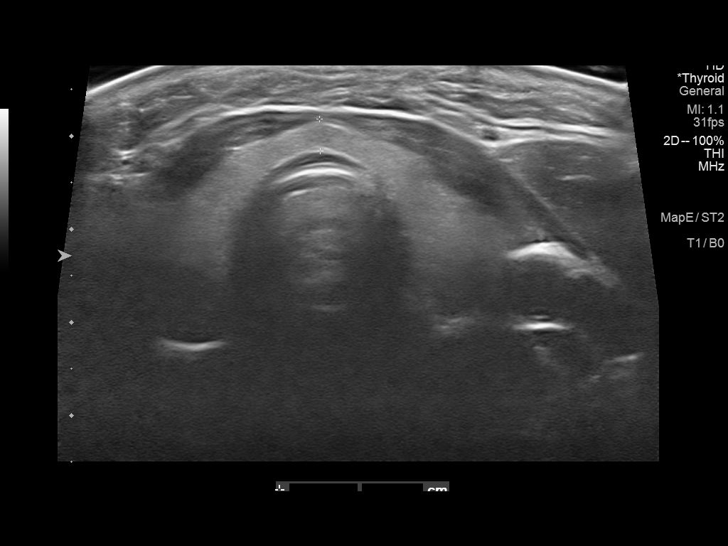
[im 4/44]
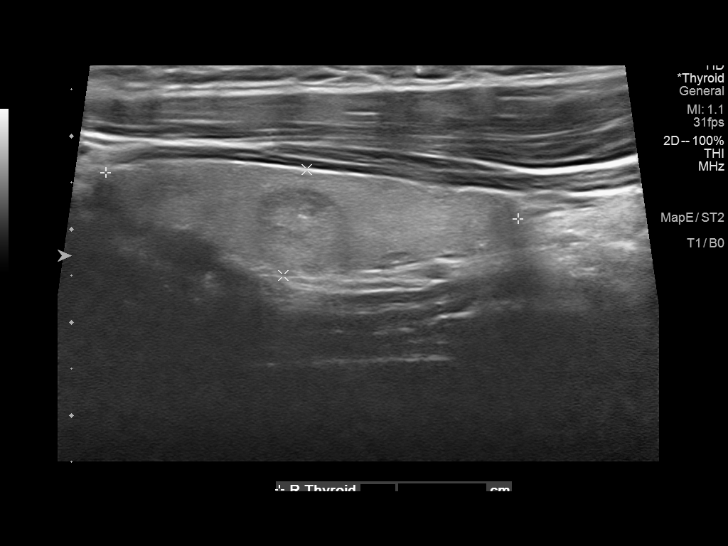
[im 8/44]
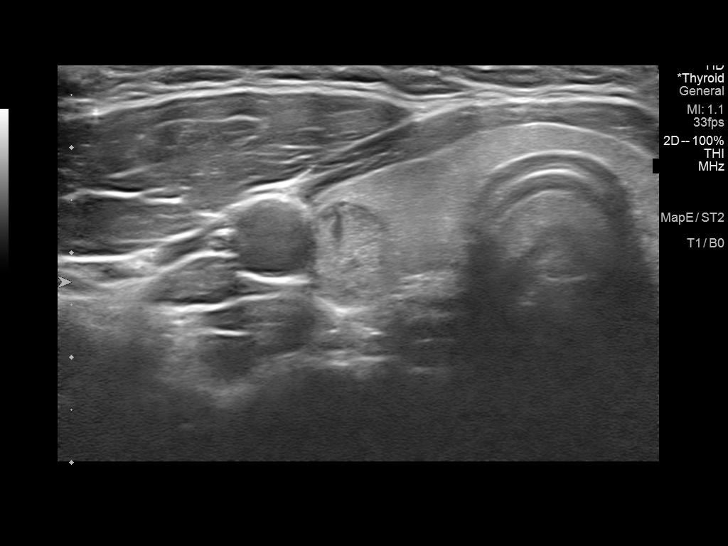
[im 12/44]
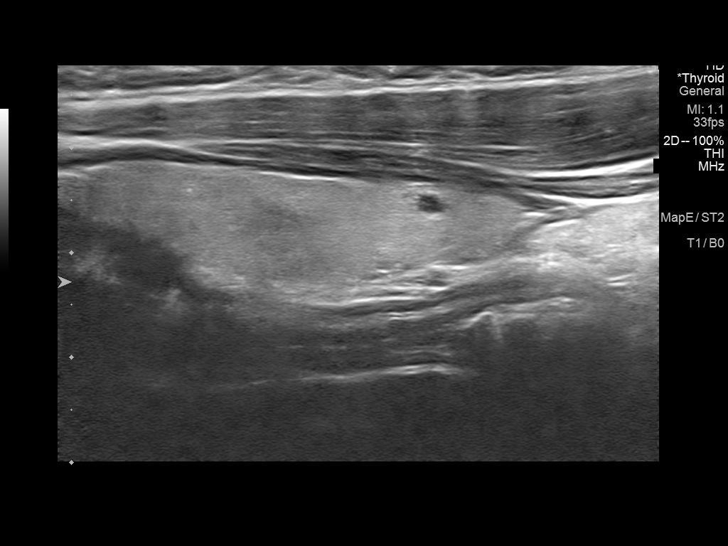
[im 16/44]
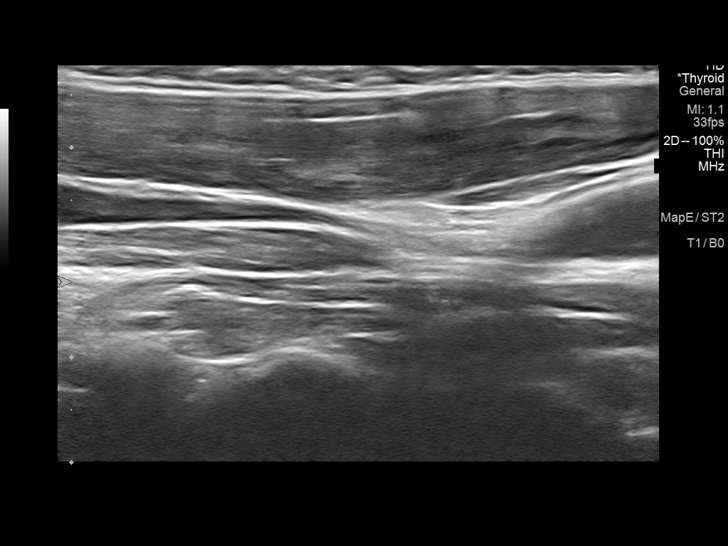
[im 20/44]
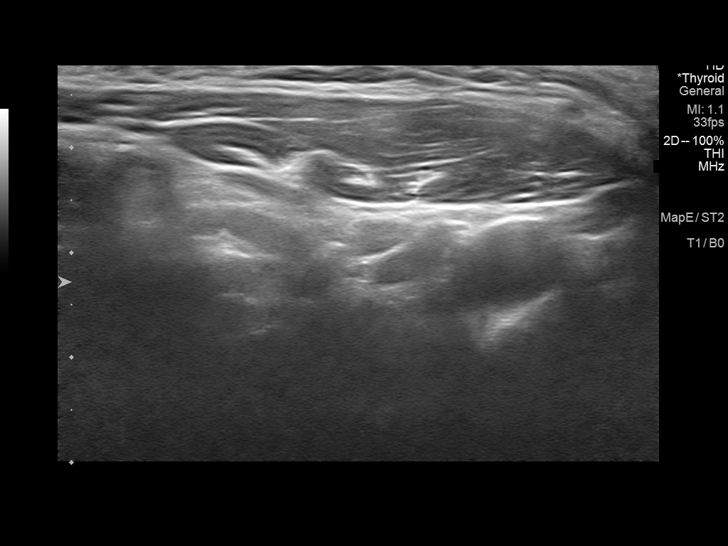
[im 24/44]
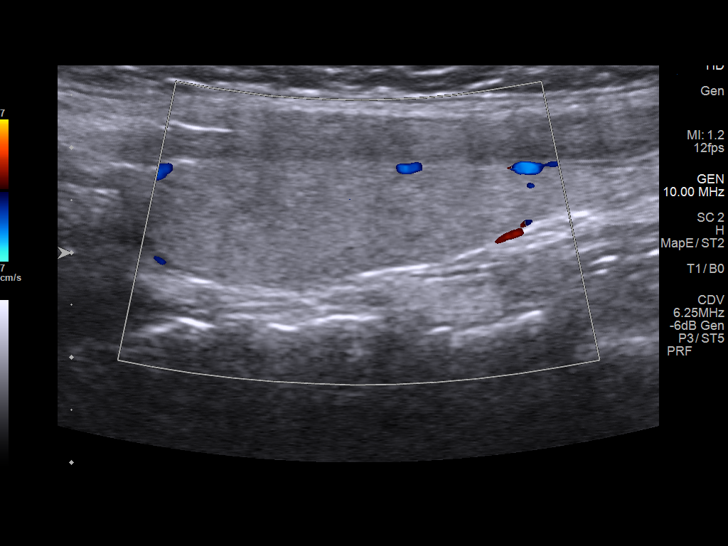
[im 28/44]
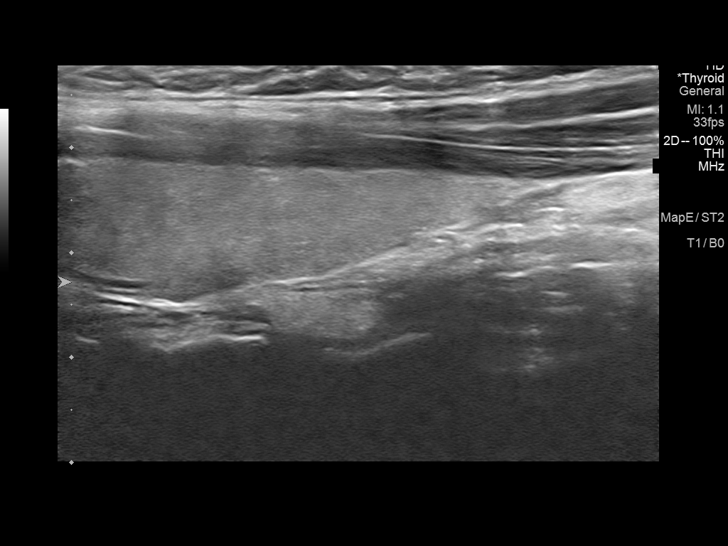
[im 32/44]
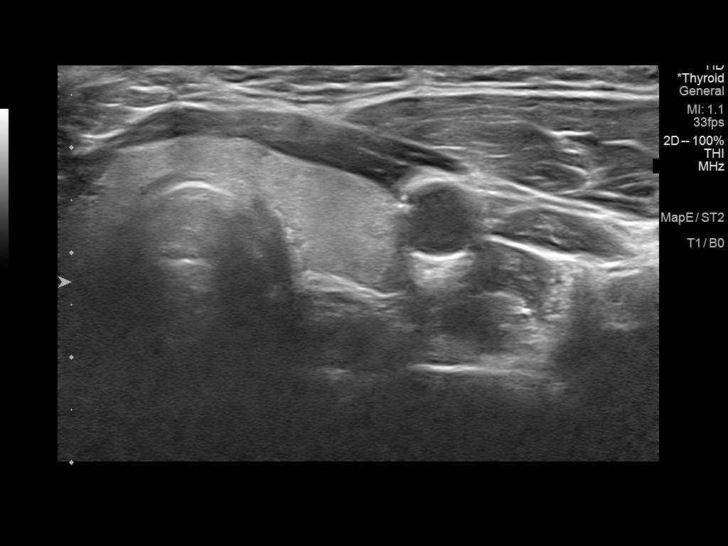
[im 36/44]
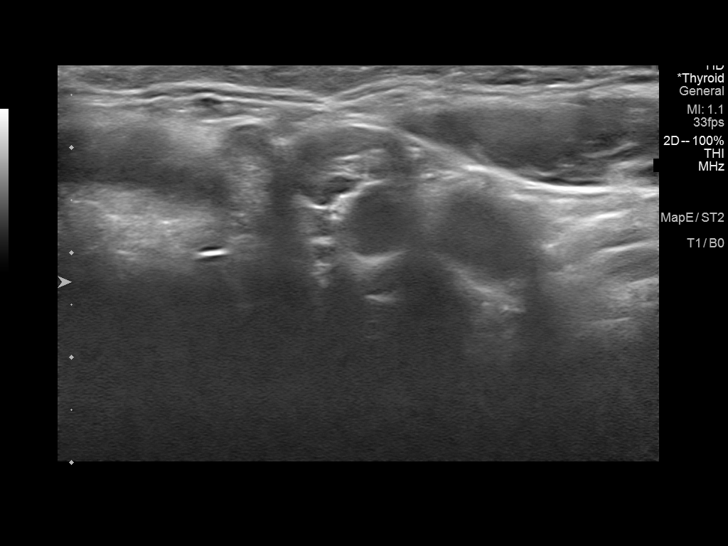
[im 40/44]
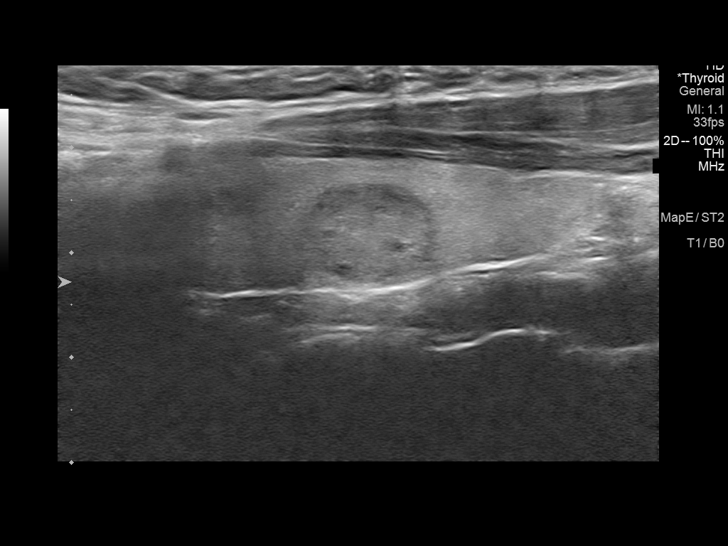
[im 44/44]
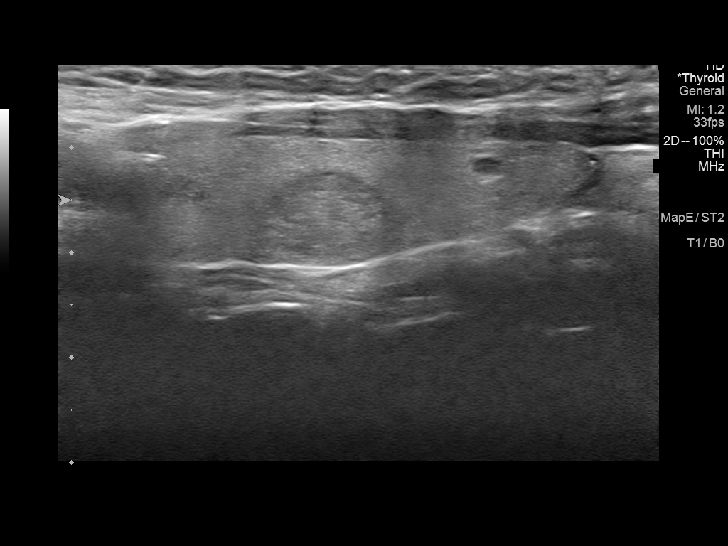

[Series 2001: us thyroid · 0.07mm/px · 1 of 3 slices shown (2 of 2)]
[im 3/3]
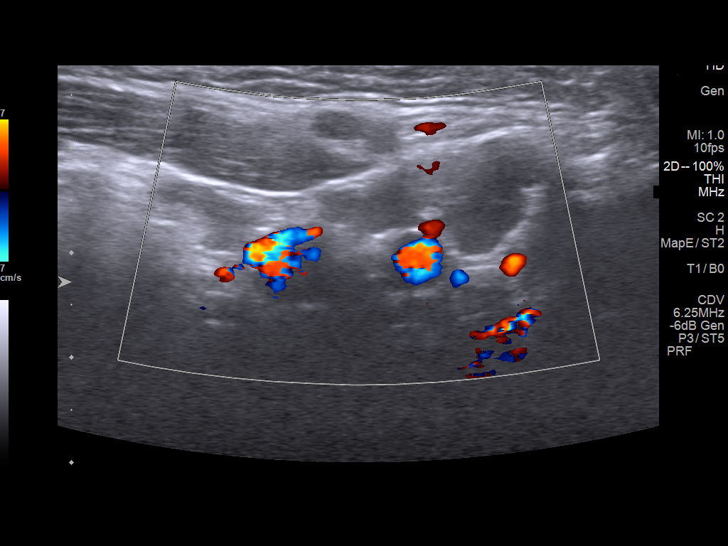

[13 of 25 positions shown; findings below may reference images not displayed]

FINDINGS: Parenchymal Echotexture: Mildly heterogenous

Isthmus: 0.3 cm

Right lobe: 4.5 x 1.2 x 1.5 cm

Left lobe: 4.7 x 1.4 x 1.3 cm

_________________________________________________________

Estimated total number of nodules >/= 1 cm: 1

Number of spongiform nodules >/=  2 cm not described below (TR1): 0

Number of mixed cystic and solid nodules >/= 1.5 cm not described
below (TR2): 0

_________________________________________________________

Nodule # 1:

Location: Right; Mid

Maximum size: 1.2 cm; Other 2 dimensions: 1.0 x 0.8 cm

Composition: solid/almost completely solid (2)

Echogenicity: isoechoic (1)

Shape: not taller-than-wide (0)

Margins: smooth (0)

Echogenic foci: none (0)

ACR TI-RADS total points: 3.

ACR TI-RADS risk category: TR3 (3 points).

ACR TI-RADS recommendations:

*Given size (>/= 1.5 - 2.4 cm) and appearance, a follow-up
ultrasound in 1 year should be considered based on TI-RADS criteria.

_________________________________________________________
IMPRESSION: 1. Mildly heterogeneous and enlarged thyroid gland.
2. Incidental note is made of a small 1.2 cm TI-RADS category 3
(mildly suspicious) nodule which does not meet criteria to warrant
further evaluation.

The above is in keeping with the ACR TI-RADS recommendations - [HOSPITAL] 1419;[DATE].

## 2022-10-23 ENCOUNTER — Telehealth: Payer: Medicare Other | Admitting: Nurse Practitioner

## 2022-10-23 DIAGNOSIS — R300 Dysuria: Secondary | ICD-10-CM

## 2022-10-23 NOTE — Progress Notes (Signed)
Because we treated you for a UTI virtually in the past month, I feel your condition warrants further evaluation and I recommend that you be seen for a face to face visit.  Please contact your primary care physician practice to be seen. Many offices offer virtual options to be seen via video if you are not comfortable going in person to a medical facility at this time.  NOTE: You will NOT be charged for this eVisit.  If you do not have a PCP, [facility] offers a free physician referral service available at [phone]. Our trained staff has the experience, knowledge and resources to put you in touch with a physician who is right for you.    If you are having a true medical emergency please call 911.   Your e-visit answers were reviewed by a board certified advanced clinical practitioner to complete your personal care plan.  T

## 2023-03-08 ENCOUNTER — Telehealth: Payer: Medicare Other | Admitting: Family

## 2023-03-08 DIAGNOSIS — L700 Acne vulgaris: Secondary | ICD-10-CM

## 2023-03-08 MED ORDER — TRETINOIN 0.05 % EX CREA: TOPICAL_CREAM | Freq: Every day | CUTANEOUS | 1 refills | Status: AC

## 2023-03-08 NOTE — Progress Notes (Signed)
E-Visit for Acne   We are sorry that you are experiencing this issue.  Here is how we plan to help!  Based on what you shared with me it looks like you have uncomplicated acne.  Acne is a disorder of the hair follicles and oil glands (sebaceous glands). The sebaceous glands secrete oils to keep the skin moist.  When the glands get clogged, it can lead to pimples or cysts.  These cysts may become infected and leave scars. Acne is very common and normally occurs at puberty.  Acne is also inherited.  Your personal care plan consists of the following recommendations:  I recommend that you use a daily cleanser  You might try an over the counter cleanser that has benzoyl peroxide.  I recommend that you start with a product that has 2.5% benzoyl peroxide.  Stronger concentrations have not been shown to be more effective.  I have refilled your tretinoin cream.   If excessive dryness or peeling occurs, reduce dose frequency or concentration of the topical scrubs.  If excessive stinging or burning occurs, remove the topical gel with mild soap and water and resume at a lower dose the next day.  Remember oral antibiotics and topical acne treatments may increase your sensitivity to the sun!  HOME CARE: Do not squeeze pimples because that can often lead to infections, worse acne, and scars. Use a moisturizer that contains retinoid or fruit acids that may inhibit the development of new acne lesions. Although there is not a clear link that foods can cause acne, doctors do believe that too many sweets predispose you to skin problems.  GET HELP RIGHT AWAY IF: If your acne gets worse or is not better within 10 days. If you become depressed. If you become pregnant, discontinue medications and call your OB/GYN.  MAKE SURE YOU: Understand these instructions. Will watch your condition. Will get help right away if you are not doing well or get worse.  Thank you for choosing an e-visit.  Your e-visit  answers were reviewed by a board certified advanced clinical practitioner to complete your personal care plan. Depending upon the condition, your plan could have included both over the counter or prescription medications.  Please review your pharmacy choice. Make sure the pharmacy is open so you can pick up prescription now. If there is a problem, you may contact your provider through [employer] and have the prescription routed to another pharmacy.  Your safety is important to [place]. If you have drug allergies check your prescription carefully.   For the next 24 hours you can use MyChart to ask questions about today's visit, request a non-urgent call back, or ask for a work or school excuse. You will get an email in the next two days asking about your experience. I hope that your e-visit has been valuable and will speed your recovery.  Approximately 5 minutes was spent documenting and reviewing patient's chart.

## 2023-03-21 ENCOUNTER — Telehealth: Payer: Medicare Other | Admitting: Physician Assistant

## 2023-03-21 DIAGNOSIS — J208 Acute bronchitis due to other specified organisms: Secondary | ICD-10-CM | POA: Diagnosis not present

## 2023-03-21 MED ORDER — PREDNISONE 20 MG PO TABS: 40.0000 mg | ORAL_TABLET | Freq: Every day | ORAL | 0 refills | Status: DC

## 2023-03-21 MED ORDER — BENZONATATE 100 MG PO CAPS: 100.0000 mg | ORAL_CAPSULE | Freq: Three times a day (TID) | ORAL | 0 refills | Status: AC | PRN

## 2023-03-21 MED ORDER — ALBUTEROL SULFATE HFA 108 (90 BASE) MCG/ACT IN AERS: 1.0000 | INHALATION_SPRAY | Freq: Four times a day (QID) | RESPIRATORY_TRACT | 0 refills | Status: DC | PRN

## 2023-03-21 NOTE — Progress Notes (Signed)
E-Visit for Cough   We are sorry that you are not feeling well.  Here is how we plan to help!  Based on your presentation I believe you most likely have A cough due to a virus.  This is called viral bronchitis and is best treated by rest, plenty of fluids and control of the cough.  You may use Ibuprofen or Tylenol as directed to help your symptoms.     In addition you may use A non-prescription cough medication called Mucinex DM: take 2 tablets every 12 hours. and A prescription cough medication called Tessalon Perles 100mg . You may take 1-2 capsules every 8 hours as needed for your cough.  Prednisone 20mg  Take 2 tablets (40mg ) daily with food for 5 days.  Albuterol inhaler Use 1-2 puffs every 4-6 hours as needed for shortness of breath, chest tightness, and/or wheezing.  From your responses in the eVisit questionnaire you describe inflammation in the upper respiratory tract which is causing a significant cough.  This is commonly called Bronchitis and has four common causes:   Allergies Viral Infections Acid Reflux Bacterial Infection Allergies, viruses and acid reflux are treated by controlling symptoms or eliminating the cause. An example might be a cough caused by taking certain blood pressure medications. You stop the cough by changing the medication. Another example might be a cough caused by acid reflux. Controlling the reflux helps control the cough.  USE OF BRONCHODILATOR ("RESCUE") INHALERS: There is a risk from using your bronchodilator too frequently.  The risk is that over-reliance on a medication which only relaxes the muscles surrounding the breathing tubes can reduce the effectiveness of medications prescribed to reduce swelling and congestion of the tubes themselves.  Although you feel brief relief from the bronchodilator inhaler, your asthma may actually be worsening with the tubes becoming more swollen and filled with mucus.  This can delay other crucial treatments, such as  oral steroid medications. If you need to use a bronchodilator inhaler daily, several times per day, you should discuss this with your provider.  There are probably better treatments that could be used to keep your asthma under control.     HOME CARE Only take medications as instructed by your medical team. Complete the entire course of an antibiotic. Drink plenty of fluids and get plenty of rest. Avoid close contacts especially the very young and the elderly Cover your mouth if you cough or cough into your sleeve. Always remember to wash your hands A steam or ultrasonic humidifier can help congestion.   GET HELP RIGHT AWAY IF: You develop worsening fever. You become short of breath You cough up blood. Your symptoms persist after you have completed your treatment plan MAKE SURE YOU  Understand these instructions. Will watch your condition. Will get help right away if you are not doing well or get worse.    Thank you for choosing an e-visit.  Your e-visit answers were reviewed by a board certified advanced clinical practitioner to complete your personal care plan. Depending upon the condition, your plan could have included both over the counter or prescription medications.  Please review your pharmacy choice. Make sure the pharmacy is open so you can pick up prescription now. If there is a problem, you may contact your provider through [employer] and have the prescription routed to another pharmacy.  Your safety is important to [place]. If you have drug allergies check your prescription carefully.   For the next 24 hours you can use MyChart to  ask questions about today's visit, request a non-urgent call back, or ask for a work or school excuse. You will get an email in the next two days asking about your experience. I hope that your e-visit has been valuable and will speed your recovery.   I have spent 5 minutes in review of e-visit questionnaire, review and updating patient chart,  medical decision making and response to patient.   [name], PA-C

## 2023-04-03 ENCOUNTER — Telehealth: Payer: Medicare Other | Admitting: Physician Assistant

## 2023-04-03 DIAGNOSIS — R3989 Other symptoms and signs involving the genitourinary system: Secondary | ICD-10-CM | POA: Diagnosis not present

## 2023-04-04 MED ORDER — NITROFURANTOIN MONOHYD MACRO 100 MG PO CAPS: 100.0000 mg | ORAL_CAPSULE | Freq: Two times a day (BID) | ORAL | 0 refills | Status: AC

## 2023-04-04 NOTE — Progress Notes (Signed)

## 2023-04-14 ENCOUNTER — Encounter: Payer: Self-pay | Admitting: Family Medicine

## 2023-04-14 ENCOUNTER — Telehealth: Payer: Medicare Other | Admitting: Family Medicine

## 2023-04-14 DIAGNOSIS — N926 Irregular menstruation, unspecified: Secondary | ICD-10-CM

## 2023-04-14 NOTE — Progress Notes (Signed)
Kelli Howard   Wants to get medication to delay her cycle for an upcoming trip. Is going to call her PCP office to get an appt.  Patient acknowledged agreement and understanding of the plan.

## 2023-05-24 ENCOUNTER — Telehealth: Payer: Medicare Other | Admitting: Nurse Practitioner

## 2023-05-24 DIAGNOSIS — J4540 Moderate persistent asthma, uncomplicated: Secondary | ICD-10-CM

## 2023-05-24 MED ORDER — ALBUTEROL SULFATE (2.5 MG/3ML) 0.083% IN NEBU: 2.5000 mg | INHALATION_SOLUTION | Freq: Four times a day (QID) | RESPIRATORY_TRACT | 0 refills | Status: DC | PRN

## 2023-05-24 MED ORDER — ALBUTEROL SULFATE HFA 108 (90 BASE) MCG/ACT IN AERS: 1.0000 | INHALATION_SPRAY | Freq: Four times a day (QID) | RESPIRATORY_TRACT | 0 refills | Status: DC | PRN

## 2023-05-24 MED ORDER — FLUTICASONE-SALMETEROL 230-21 MCG/ACT IN AERO: 2.0000 | INHALATION_SPRAY | Freq: Two times a day (BID) | RESPIRATORY_TRACT | 0 refills | Status: DC

## 2023-05-24 NOTE — Progress Notes (Signed)
I have spent 5 minutes in review of e-visit questionnaire, review and updating patient chart, medical decision making and response to patient.  ° °Jerrell Mangel W Secilia Apps, NP ° °  °

## 2023-05-24 NOTE — Progress Notes (Signed)
E-Visit for Asthma  Based on what you have shared with me, it looks like you may have a flare up of your asthma.  Asthma is a chronic (ongoing) lung disease which results in airway obstruction, inflammation and hyper-responsiveness.   Asthma symptoms vary from person to person, with common symptoms including nighttime awakening and decreased ability to participate in normal activities as a result of shortness of breath. It is often triggered by changes in weather, changes in the season, changes in air temperature, or inside (home, school, daycare or work) allergens such as animal dander, mold, mildew, woodstoves or cockroaches.   It can also be triggered by hormonal changes, extreme emotion, physical exertion or an upper respiratory tract illness.     It is important to identify the trigger, and then eliminate or avoid the trigger if possible.   If you have been prescribed medications to be taken on a regular basis, it is important to follow the asthma action plan and to follow guidelines to adjust medication in response to increasing symptoms of decreased peak expiratory flow rate  Treatment: I have prescribed the medications you requested to be refilled   HOME CARE Only take medications as instructed by your medical team. Consider wearing a mask or scarf to improve breathing air temperature have been shown to decrease irritation and decrease exacerbations Get rest. Taking a steamy shower or using a humidifier may help nasal congestion sand ease sore throat pain. You can place a towel over your head and breathe in the steam from hot water coming from a faucet. Using a saline nasal spray works much the same way.  Cough drops, hare candies and sore throat lozenges may ease your cough.  Avoid close contacts especially the very you and the elderly Cover your mouth if you cough or  sneeze Always remember to wash your hands.    GET HELP RIGHT AWAY IF: You develop worsening symptoms; breathlessness at rest, drowsy, confused or agitated, unable to speak in full sentences You have coughing fits You develop a severe headache or visual changes You develop shortness of breath, difficulty breathing or start having chest pain Your symptoms persist after you have completed your treatment plan If your symptoms do not improve within 10 days  MAKE SURE YOU Understand these instructions. Will watch your condition. Will get help right away if you are not doing well or get worse.   Your e-visit answers were reviewed by a board certified advanced clinical practitioner to complete your personal care plan, Depending upon the condition, your plan could have included both over the counter or prescription medications.   Please review your pharmacy choice. Your safety is important to [place]. If you have drug allergies check your prescription carefully.  You can use MyChart to ask questions about today's visit, request a non-urgent  call back, or ask for a work or school excuse for 24 hours related to this e-Visit. If it has been greater than 24 hours you will need to follow up with your provider, or enter a new e-Visit to address those concerns.   You will get an e-mail in the next two days asking about your experience. I hope that your e-visit has been valuable and will speed your recovery. Thank you for using e-visits.

## 2023-06-13 ENCOUNTER — Telehealth: Payer: Medicare Other | Admitting: Emergency Medicine

## 2023-06-13 DIAGNOSIS — J45909 Unspecified asthma, uncomplicated: Secondary | ICD-10-CM | POA: Diagnosis not present

## 2023-06-13 DIAGNOSIS — J4540 Moderate persistent asthma, uncomplicated: Secondary | ICD-10-CM | POA: Diagnosis not present

## 2023-06-13 MED ORDER — ALBUTEROL SULFATE HFA 108 (90 BASE) MCG/ACT IN AERS
2.0000 | INHALATION_SPRAY | RESPIRATORY_TRACT | 3 refills | Status: DC | PRN
Start: 2023-06-13 — End: 2023-12-14

## 2023-06-13 MED ORDER — ALBUTEROL SULFATE (2.5 MG/3ML) 0.083% IN NEBU: 2.5000 mg | INHALATION_SOLUTION | Freq: Four times a day (QID) | RESPIRATORY_TRACT | 0 refills | Status: DC | PRN

## 2023-06-13 MED ORDER — PREDNISONE 20 MG PO TABS: 40.0000 mg | ORAL_TABLET | Freq: Every day | ORAL | 0 refills | Status: DC

## 2023-06-13 NOTE — Progress Notes (Signed)

## 2023-06-15 ENCOUNTER — Other Ambulatory Visit: Payer: Self-pay | Admitting: Nurse Practitioner

## 2023-06-15 DIAGNOSIS — J4540 Moderate persistent asthma, uncomplicated: Secondary | ICD-10-CM

## 2023-06-16 NOTE — Telephone Encounter (Signed)
 Patient no longer under prescriber's care Requested Prescriptions  Pending Prescriptions Disp Refills   albuterol  (VENTOLIN  HFA) 108 (90 Base) MCG/ACT inhaler [Pharmacy Med Name: ALBUTEROL  HFA INH (200 PUFFS) 6.7GM] 6.7 g     Sig: INHALE 1 TO 2 PUFFS INTO THE LUNGS EVERY 6 HOURS AS NEEDED     Pulmonology:  Beta Agonists 2 Failed - 06/16/2023  2:31 PM      Failed - Valid encounter within last 12 months    Recent Outpatient Visits           3 years ago Mood disorder [facility])   [facility], Marguerita Shih, MD   3 years ago Breast lump on right side at 9 o'clock position   Primary Care at [facility], Edyth Grana, MD   4 years ago Bipolar disorder, current episode mixed, severe, without psychotic features [facility])   [facility] at [facility], Marguerita Shih, MD   4 years ago Bipolar disorder, current episode mixed, moderate (HCC)   [facility] at [facility], Marguerita Shih, MD   5 years ago Abnormal vaginal bleeding   Primary Care at [facility], [place], MD              Passed - Last BP in normal range    BP Readings from Last 1 Encounters:  03/26/21 124/70         Passed - Last Heart Rate in normal range    Pulse Readings from Last 1 Encounters:  03/26/21 92

## 2023-12-14 ENCOUNTER — Telehealth: Admitting: Family

## 2023-12-14 DIAGNOSIS — J4540 Moderate persistent asthma, uncomplicated: Secondary | ICD-10-CM

## 2023-12-14 MED ORDER — ALBUTEROL SULFATE HFA 108 (90 BASE) MCG/ACT IN AERS: 1.0000 | INHALATION_SPRAY | Freq: Four times a day (QID) | RESPIRATORY_TRACT | 0 refills | Status: DC | PRN

## 2023-12-14 MED ORDER — BUDESONIDE-FORMOTEROL FUMARATE 160-4.5 MCG/ACT IN AERO: 2.0000 | INHALATION_SPRAY | Freq: Two times a day (BID) | RESPIRATORY_TRACT | 3 refills | Status: DC

## 2023-12-14 NOTE — Progress Notes (Signed)

## 2024-01-26 ENCOUNTER — Encounter: Payer: Self-pay | Admitting: Family Medicine

## 2024-01-26 ENCOUNTER — Telehealth: Admitting: Family Medicine

## 2024-01-26 DIAGNOSIS — R112 Nausea with vomiting, unspecified: Secondary | ICD-10-CM

## 2024-01-26 MED ORDER — ONDANSETRON 4 MG PO TBDP: 4.0000 mg | ORAL_TABLET | Freq: Three times a day (TID) | ORAL | 0 refills | Status: AC | PRN

## 2024-01-26 NOTE — Progress Notes (Signed)
 E-Visit for Nausea and Vomiting   We are sorry that you are not feeling well. Here is how we plan to help!  Based on what you have shared with me it looks like you have a Virus that is irritating your GI tract.  Vomiting is the forceful emptying of a portion of the stomach's content through the mouth.  Although nausea and vomiting can make you feel miserable, it's important to remember that these are not diseases, but rather symptoms of an underlying illness.  When we treat short term symptoms, we always caution that any symptoms that persist should be fully evaluated in a medical office.  I have prescribed a medication that will help alleviate your symptoms and allow you to stay hydrated:  Zofran 4 mg 1 tablet every 8 hours as needed for nausea and vomiting  HOME CARE: Drink clear liquids.  This is very important! Dehydration (the lack of fluid) can lead to a serious complication.  Start off with 1 tablespoon every 5 minutes for 8 hours. You may begin eating bland foods after 8 hours without vomiting.  Start with saltine crackers, white bread, rice, mashed potatoes, applesauce. After 48 hours on a bland diet, you may resume a normal diet. Try to go to sleep.  Sleep often empties the stomach and relieves the need to vomit.  GET HELP RIGHT AWAY IF:  Your symptoms do not improve or worsen within 2 days after treatment. You have a fever for over 3 days. You cannot keep down fluids after trying the medication.  MAKE SURE YOU:  Understand these instructions. Will watch your condition. Will get help right away if you are not doing well or get worse.    Thank you for choosing an e-visit.  Your e-visit answers were reviewed by a board certified advanced clinical practitioner to complete your personal care plan. Depending upon the condition, your plan could have included both over the counter or prescription medications.  Please review your pharmacy choice. Make sure the pharmacy is open so  you can pick up prescription now. If there is a problem, you may contact your provider through [employer] and have the prescription routed to another pharmacy.  Your safety is important to [place]. If you have drug allergies check your prescription carefully.   For the next 24 hours you can use MyChart to ask questions about today's visit, request a non-urgent call back, or ask for a work or school excuse. You will get an email in the next two days asking about your experience. I hope that your e-visit has been valuable and will speed your recovery.  I provided 5 minutes of non face-to-face time during this encounter for chart review, medication and order placement, as well as and documentation.

## 2024-03-08 ENCOUNTER — Telehealth: Admitting: Physician Assistant

## 2024-03-08 DIAGNOSIS — J4541 Moderate persistent asthma with (acute) exacerbation: Secondary | ICD-10-CM

## 2024-03-08 NOTE — Progress Notes (Signed)
  Because of your symptoms, I feel your condition warrants further evaluation and I recommend that you be seen in a face-to-face visit. This will allow us  to be sure the issue is not emergent. You may also request be seen via telehealth video visit if needed.    NOTE: There will be NO CHARGE for this E-Visit   If you are having a true medical emergency, please call 911.     For an urgent face to face visit, [facility] has multiple urgent care centers for your convenience.  Click the link below for the full list of locations and hours, walk-in wait times, appointment scheduling options and driving directions:  Urgent Care - [place], [place], [place], [place], [place], [place]  [place]     Your MyChart E-visit questionnaire answers were reviewed by a board certified advanced clinical practitioner to complete your personal care plan based on your specific symptoms.    Thank you for using e-Visits.

## 2024-03-10 ENCOUNTER — Telehealth: Admitting: Physician Assistant

## 2024-03-10 DIAGNOSIS — J4541 Moderate persistent asthma with (acute) exacerbation: Secondary | ICD-10-CM

## 2024-03-10 MED ORDER — ALBUTEROL SULFATE HFA 108 (90 BASE) MCG/ACT IN AERS
1.0000 | INHALATION_SPRAY | Freq: Four times a day (QID) | RESPIRATORY_TRACT | 0 refills | Status: AC | PRN
Start: 2024-03-10 — End: ?

## 2024-03-10 MED ORDER — PREDNISONE 20 MG PO TABS: 40.0000 mg | ORAL_TABLET | Freq: Every day | ORAL | 0 refills | Status: AC

## 2024-03-10 MED ORDER — ALBUTEROL SULFATE (2.5 MG/3ML) 0.083% IN NEBU: 2.5000 mg | INHALATION_SOLUTION | Freq: Four times a day (QID) | RESPIRATORY_TRACT | 0 refills | Status: AC | PRN

## 2024-03-10 MED ORDER — BUDESONIDE-FORMOTEROL FUMARATE 160-4.5 MCG/ACT IN AERO: 2.0000 | INHALATION_SPRAY | Freq: Two times a day (BID) | RESPIRATORY_TRACT | 0 refills | Status: AC

## 2024-03-10 NOTE — Progress Notes (Signed)
 Virtual Visit Consent   Kelli Howard, you are scheduled for a virtual visit with a [facility] provider today. Just as with appointments in the office, your consent must be obtained to participate. Your consent will be active for this visit and any virtual visit you may have with one of our providers in the next 365 days. If you have a MyChart account, a copy of this consent can be sent to you electronically.  As this is a virtual visit, video technology does not allow for your provider to perform a traditional examination. This may limit your provider's ability to fully assess your condition. If your provider identifies any concerns that need to be evaluated in person or the need to arrange testing (such as labs, EKG, etc.), we will make arrangements to do so. Although advances in technology are sophisticated, we cannot ensure that it will always work on either your end or our end. If the connection with a video visit is poor, the visit may have to be switched to a telephone visit. With either a video or telephone visit, we are not always able to ensure that we have a secure connection.  By engaging in this virtual visit, you consent to the provision of healthcare and authorize for your insurance to be billed (if applicable) for the services provided during this visit. Depending on your insurance coverage, you may receive a charge related to this service.  I need to obtain your verbal consent now. Are you willing to proceed with your visit today? Kelli Howard has provided verbal consent on 03/10/2024 for a virtual visit (video or telephone). Kelli CHRISTELLA Dickinson, PA-C  Date: 03/10/2024 8:55 AM   Virtual Visit via Video Note   I, Kelli Howard, connected with  Kelli Howard  ([id number], Aug 28, 1990) on 03/10/24 at  8:45 AM EST by a video-enabled telemedicine application and verified that I am speaking with the correct person using two identifiers.  Location: Patient: Virtual Visit  Location Patient: Home Provider: Virtual Visit Location Provider: Home Office   I discussed the limitations of evaluation and management by telemedicine and the availability of in person appointments. The patient expressed understanding and agreed to proceed.    History of Present Illness: Kelli Howard is a 33 y.o. who identifies as a female who was assigned female at birth, and is being seen today for asthma exacerbation.   Reports she is on a wait list for a PCP in [place], [place] since Nov 2024. Reports that wait list is long for Medicaid patients there. Does have a PCP appt in March 2026.   Problems:  Patient Active Problem List   Diagnosis Date Noted   Abnormal vaginal bleeding 05/20/2018   Bipolar disorder, current episode mixed, moderate (HCC) 04/21/2018   Borderline personality disorder (HCC) 04/21/2018   Migraine without aura and with status migrainosus, not intractable 12/14/2017   Generalized anxiety disorder with panic attacks 12/14/2017    Allergies:  Allergies  Allergen Reactions   Peanuts [Peanut Oil] Anaphylaxis    Also: tree nuts    Other Rash    mushrooms   Banana     Unknown reaction    Celery Oil Itching and Rash   Shellfish Allergy Rash   Medications:  Current Outpatient Medications:    albuterol  (PROVENTIL ) (2.5 MG/3ML) 0.083% nebulizer solution, Take 3 mLs (2.5 mg total) by nebulization every 6 (six) hours as needed for wheezing or shortness of breath., Disp: 150 mL, Rfl: 0  albuterol  (VENTOLIN  HFA) 108 (90 Base) MCG/ACT inhaler, Inhale 1-2 puffs into the lungs every 6 (six) hours as needed., Disp: 8 g, Rfl: 0   aluminum  chloride (DRYSOL) 20 % external solution, APPLY TO AFFECTED AREA EVERY DAY AT BEDTIME, Disp: 35 mL, Rfl: 5   amphetamine-dextroamphetamine (ADDERALL) 15 MG tablet, TAKE 1 TABLET TWICE A DAILY WITH FOOD, Disp: , Rfl:    azelastine  (ASTELIN ) 0.1 % nasal spray, Place 1 spray into both nostrils 2 (two) times daily. Use in each nostril as  directed, Disp: 30 mL, Rfl: 0   benzonatate  (TESSALON ) 100 MG capsule, Take 1-2 capsules (100-200 mg total) by mouth 3 (three) times daily as needed., Disp: 30 capsule, Rfl: 0   budesonide -formoterol  (SYMBICORT ) 160-4.5 MCG/ACT inhaler, Inhale 2 puffs into the lungs 2 (two) times daily., Disp: 10.2 g, Rfl: 0   busPIRone  (BUSPAR ) 30 MG tablet, Take 1 tablet (30 mg total) by mouth 2 (two) times daily as needed (anxiety)., Disp: 30 tablet, Rfl: 3   cetirizine  (ZYRTEC ) 10 MG tablet, Take 1 tablet (10 mg total) by mouth daily., Disp: 30 tablet, Rfl: 11   clindamycin -benzoyl peroxide (BENZACLIN) gel, APPLY TOPICALLY TO FACE TWICE A DAY, Disp: 50 g, Rfl: 0   EPINEPHrine  0.3 mg/0.3 mL IJ SOAJ injection, INJECT CONTENTS OF 1 INJECTOR INTO THE MUSCLE AS NEEDED FOR ANAPHYLAXIS, Disp: 2 each, Rfl: 2   hydroquinone  4 % cream, APPLY TO AFFECTED AREA TWICE A DAY, Disp: 28.35 g, Rfl: 5   montelukast  (SINGULAIR ) 10 MG tablet, Take 1 tablet (10 mg total) by mouth at bedtime., Disp: 30 tablet, Rfl: 11   nitrofurantoin , macrocrystal-monohydrate, (MACROBID ) 100 MG capsule, Take 1 capsule (100 mg total) by mouth 2 (two) times daily., Disp: 10 capsule, Rfl: 0   ondansetron  (ZOFRAN -ODT) 4 MG disintegrating tablet, Take 1 tablet (4 mg total) by mouth every 8 (eight) hours as needed for nausea or vomiting., Disp: 20 tablet, Rfl: 0   predniSONE  (DELTASONE ) 20 MG tablet, Take 2 tablets (40 mg total) by mouth daily., Disp: 10 tablet, Rfl: 0   QUEtiapine  (SEROQUEL ) 100 MG tablet, Take 1 tablet (100 mg total) by mouth at bedtime., Disp: 30 tablet, Rfl: 3   Rimegepant Sulfate (NURTEC) 75 MG TBDP, Take 1 tablet by mouth daily as needed (migraine)., Disp: 8 tablet, Rfl: 0   tiZANidine  (ZANAFLEX ) 4 MG tablet, Take 1 tablet (4 mg total) by mouth every 6 (six) hours as needed for muscle spasms., Disp: 30 tablet, Rfl: 0   traZODone  (DESYREL ) 100 MG tablet, TAKE 1 TABLETS (100 MG TOTAL) BY MOUTH AT BEDTIME., Disp: 30 tablet, Rfl: 3    tretinoin  (RETIN-A ) 0.05 % cream, Apply topically at bedtime., Disp: 45 g, Rfl: 1   venlafaxine  XR (EFFEXOR -XR) 75 MG 24 hr capsule, TAKE 1 CAPSULE BY MOUTH TWICE A DAY, Disp: 60 capsule, Rfl: 3  Observations/Objective: Patient is well-developed, well-nourished in no acute distress.  Resting comfortably at home.  Head is normocephalic, atraumatic.  No labored breathing.  Speech is clear and coherent with logical content.  Patient is alert and oriented at baseline.    Assessment and Plan: 1. Moderate persistent asthma with acute exacerbation - albuterol  (PROVENTIL ) (2.5 MG/3ML) 0.083% nebulizer solution; Take 3 mLs (2.5 mg total) by nebulization every 6 (six) hours as needed for wheezing or shortness of breath.  Dispense: 150 mL; Refill: 0 - albuterol  (VENTOLIN  HFA) 108 (90 Base) MCG/ACT inhaler; Inhale 1-2 puffs into the lungs every 6 (six) hours as needed.  Dispense: 8  g; Refill: 0 - budesonide -formoterol  (SYMBICORT ) 160-4.5 MCG/ACT inhaler; Inhale 2 puffs into the lungs 2 (two) times daily.  Dispense: 10.2 g; Refill: 0 - predniSONE  (DELTASONE ) 20 MG tablet; Take 2 tablets (40 mg total) by mouth daily.  Dispense: 10 tablet; Refill: 0  - Albuterol  nebulizer, albuterol  inhaler, and Symbicort  all refilled. - Added Prednisone  for asthma exacerbation - Can add Mucinex   - Push fluids.  - Rest.  - Steam and humidifier can help - Seek in person evaluation if worsening or symptoms fail to improve    Follow Up Instructions: I discussed the assessment and treatment plan with the patient. The patient was provided an opportunity to ask questions and all were answered. The patient agreed with the plan and demonstrated an understanding of the instructions.  A copy of instructions were sent to the patient via MyChart unless otherwise noted below.    The patient was advised to call back or seek an in-person evaluation if the symptoms worsen or if the condition fails to improve as anticipated.     Kelli CHRISTELLA Dickinson, PA-C

## 2024-03-10 NOTE — Patient Instructions (Signed)
 Kelli Howard, thank you for joining Kelli Howard Dickinson, PA-C for today's virtual visit.  While this provider is not your primary care provider (PCP), if your PCP is located in our provider database this encounter information will be shared with them immediately following your visit.   A [facility] MyChart account gives you access to today's visit and all your visits, tests, and labs performed at [facility]  click here if you don't have a [facility] MyChart account or go to [email or web address]  Consent: (Patient) Kelli Howard provided verbal consent for this virtual visit at the beginning of the encounter.  Current Medications:  Current Outpatient Medications:    albuterol  (PROVENTIL ) (2.5 MG/3ML) 0.083% nebulizer solution, Take 3 mLs (2.5 mg total) by nebulization every 6 (six) hours as needed for wheezing or shortness of breath., Disp: 150 mL, Rfl: 0   albuterol  (VENTOLIN  HFA) 108 (90 Base) MCG/ACT inhaler, Inhale 1-2 puffs into the lungs every 6 (six) hours as needed., Disp: 8 g, Rfl: 0   aluminum  chloride (DRYSOL) 20 % external solution, APPLY TO AFFECTED AREA EVERY DAY AT BEDTIME, Disp: 35 mL, Rfl: 5   amphetamine-dextroamphetamine (ADDERALL) 15 MG tablet, TAKE 1 TABLET TWICE A DAILY WITH FOOD, Disp: , Rfl:    azelastine  (ASTELIN ) 0.1 % nasal spray, Place 1 spray into both nostrils 2 (two) times daily. Use in each nostril as directed, Disp: 30 mL, Rfl: 0   benzonatate  (TESSALON ) 100 MG capsule, Take 1-2 capsules (100-200 mg total) by mouth 3 (three) times daily as needed., Disp: 30 capsule, Rfl: 0   budesonide -formoterol  (SYMBICORT ) 160-4.5 MCG/ACT inhaler, Inhale 2 puffs into the lungs 2 (two) times daily., Disp: 10.2 g, Rfl: 0   busPIRone  (BUSPAR ) 30 MG tablet, Take 1 tablet (30 mg total) by mouth 2 (two) times daily as needed (anxiety)., Disp: 30 tablet, Rfl: 3   cetirizine  (ZYRTEC ) 10 MG tablet, Take 1 tablet (10 mg total) by mouth daily., Disp: 30 tablet,  Rfl: 11   clindamycin -benzoyl peroxide (BENZACLIN) gel, APPLY TOPICALLY TO FACE TWICE A DAY, Disp: 50 g, Rfl: 0   EPINEPHrine  0.3 mg/0.3 mL IJ SOAJ injection, INJECT CONTENTS OF 1 INJECTOR INTO THE MUSCLE AS NEEDED FOR ANAPHYLAXIS, Disp: 2 each, Rfl: 2   hydroquinone  4 % cream, APPLY TO AFFECTED AREA TWICE A DAY, Disp: 28.35 g, Rfl: 5   montelukast  (SINGULAIR ) 10 MG tablet, Take 1 tablet (10 mg total) by mouth at bedtime., Disp: 30 tablet, Rfl: 11   nitrofurantoin , macrocrystal-monohydrate, (MACROBID ) 100 MG capsule, Take 1 capsule (100 mg total) by mouth 2 (two) times daily., Disp: 10 capsule, Rfl: 0   ondansetron  (ZOFRAN -ODT) 4 MG disintegrating tablet, Take 1 tablet (4 mg total) by mouth every 8 (eight) hours as needed for nausea or vomiting., Disp: 20 tablet, Rfl: 0   predniSONE  (DELTASONE ) 20 MG tablet, Take 2 tablets (40 mg total) by mouth daily., Disp: 10 tablet, Rfl: 0   QUEtiapine  (SEROQUEL ) 100 MG tablet, Take 1 tablet (100 mg total) by mouth at bedtime., Disp: 30 tablet, Rfl: 3   Rimegepant Sulfate (NURTEC) 75 MG TBDP, Take 1 tablet by mouth daily as needed (migraine)., Disp: 8 tablet, Rfl: 0   tiZANidine  (ZANAFLEX ) 4 MG tablet, Take 1 tablet (4 mg total) by mouth every 6 (six) hours as needed for muscle spasms., Disp: 30 tablet, Rfl: 0   traZODone  (DESYREL ) 100 MG tablet, TAKE 1 TABLETS (100 MG TOTAL) BY MOUTH AT BEDTIME., Disp: 30 tablet, Rfl: 3  tretinoin  (RETIN-A ) 0.05 % cream, Apply topically at bedtime., Disp: 45 g, Rfl: 1   venlafaxine  XR (EFFEXOR -XR) 75 MG 24 hr capsule, TAKE 1 CAPSULE BY MOUTH TWICE A DAY, Disp: 60 capsule, Rfl: 3   Medications ordered in this encounter:  Meds ordered this encounter  Medications   albuterol  (PROVENTIL ) (2.5 MG/3ML) 0.083% nebulizer solution    Sig: Take 3 mLs (2.5 mg total) by nebulization every 6 (six) hours as needed for wheezing or shortness of breath.    Dispense:  150 mL    Refill:  0    Supervising Provider:   LAMPTEY, PHILIP O  [[id number]]   albuterol  (VENTOLIN  HFA) 108 (90 Base) MCG/ACT inhaler    Sig: Inhale 1-2 puffs into the lungs every 6 (six) hours as needed.    Dispense:  8 g    Refill:  0    Supervising Provider:   LAMPTEY, PHILIP O [[id number]]   budesonide -formoterol  (SYMBICORT ) 160-4.5 MCG/ACT inhaler    Sig: Inhale 2 puffs into the lungs 2 (two) times daily.    Dispense:  10.2 g    Refill:  0    Supervising Provider:   BLAISE ALEENE KIDD [[id number]]   predniSONE  (DELTASONE ) 20 MG tablet    Sig: Take 2 tablets (40 mg total) by mouth daily.    Dispense:  10 tablet    Refill:  0    Supervising Provider:   BLAISE ALEENE KIDD [[id number]]     *If you need refills on other medications prior to your next appointment, please contact your pharmacy*  Follow-Up: Call back or seek an in-person evaluation if the symptoms worsen or if the condition fails to improve as anticipated.  [facility] [phone]  Other Instructions Asthma, Adult  Asthma is a long-term (chronic) condition that causes recurrent episodes in which the lower airways in the lungs become tight and narrow. The narrowing is caused by inflammation and tightening of the smooth muscle around the lower airways. Asthma episodes, also called asthma attacks or asthma flares, may cause coughing, making high-pitched whistling sounds when you breathe, most often when you breathe out (wheezing), shortness of breath, and chest pain. The airways may produce extra mucus caused by the inflammation and irritation. During an attack, it can be difficult to breathe. Asthma attacks can range from minor to life-threatening. Asthma cannot be cured, but medicines and lifestyle changes can help control it and treat acute attacks. It is important to keep your asthma well controlled so the condition does not interfere with your daily life. What are the causes? This condition is believed to be caused by inherited (genetic) and environmental factors, but its exact  cause is not known. What can trigger an asthma attack? Many things can bring on an asthma attack or make symptoms worse. These triggers are different for every person. Common triggers include: Allergens and irritants like mold, dust, pet dander, cockroaches, pollen, air pollution, and chemical odors. Cigarette smoke. Weather changes and cold air. Stress and strong emotional responses such as crying or laughing hard. Certain medications such as aspirin or beta blockers. Infections and inflammatory conditions, such as the flu, a cold, pneumonia, or inflammation of the nasal membranes (rhinitis). Gastroesophageal reflux disease (GERD). What are the signs or symptoms? Symptoms may occur right after exposure to an asthma trigger or hours later and can vary by person. Common signs and symptoms include: Wheezing. Trouble breathing (shortness of breath). Excessive nighttime or early morning coughing. Chest tightness. Tiredness (fatigue) with  minimal activity. Difficulty talking in complete sentences. Poor exercise tolerance. How is this diagnosed? This condition is diagnosed based on: A physical exam and your medical history. Tests, which may include: Lung function studies to evaluate the flow of air in your lungs. Allergy tests. Imaging tests, such as X-rays. How is this treated? There is no cure, but symptoms can be controlled with proper treatment. Treatment usually involves: Identifying and avoiding your asthma triggers. Inhaled medicines. Two types are commonly used to treat asthma, depending on severity: Controller medicines. These help prevent asthma symptoms from occurring. They are taken every day. Fast-acting reliever or rescue medicines. These quickly relieve asthma symptoms. They are used as needed and provide short-term relief. Using other medicines, such as: Allergy medicines, such as antihistamines, if your asthma attacks are triggered by allergens. Immune medicines  (immunomodulators). These are medicines that help control the immune system. Using supplemental oxygen. This is only needed during a severe episode. Creating an asthma action plan. An asthma action plan is a written plan for managing and treating your asthma attacks. This plan includes: A list of your asthma triggers and how to avoid them. Information about when medicines should be taken and when their dosage should be changed. Instructions about using a device called a peak flow meter. A peak flow meter measures how well the lungs are working and the severity of your asthma. It helps you monitor your condition. Follow these instructions at home: Take over-the-counter and prescription medicines only as told by your health care provider. Stay up to date on all vaccinations as recommended by your healthcare provider, including vaccines for the flu and pneumonia. Use a peak flow meter and keep track of your peak flow readings. Understand and use your asthma action plan to address any asthma flares. Do not smoke or allow anyone to smoke in your home. Contact a health care provider if: You have wheezing, shortness of breath, or a cough that is not responding to medicines. Your medicines are causing side effects, such as a rash, itching, swelling, or trouble breathing. You need to use a reliever medicine more than 2-3 times a week. Your peak flow reading is still at 50-79% of your personal best after following your action plan for 1 hour. You have a fever and shortness of breath. Get help right away if: You are getting worse and do not respond to treatment during an asthma attack. You are short of breath when at rest or when doing very little physical activity. You have difficulty eating, drinking, or talking. You have chest pain or tightness. You develop a fast heartbeat or palpitations. You have a bluish color to your lips or fingernails. You are light-headed or dizzy, or you faint. Your peak  flow reading is less than 50% of your personal best. You feel too tired to breathe normally. These symptoms may be an emergency. Get help right away. Call 911. Do not wait to see if the symptoms will go away. Do not drive yourself to the hospital. Summary Asthma is a long-term (chronic) condition that causes recurrent episodes in which the airways become tight and narrow. Asthma episodes, also called asthma attacks or asthma flares, can cause coughing, wheezing, shortness of breath, and chest pain. Asthma cannot be cured, but medicines and lifestyle changes can help keep it well controlled and prevent asthma flares. Make sure you understand how to avoid triggers and how and when to use your medicines. Asthma attacks can range from minor to life-threatening. Get help  right away if you have an asthma attack and do not respond to treatment with your usual rescue medicines. This information is not intended to replace advice given to you by your health care provider. Make sure you discuss any questions you have with your health care provider. Document Revised: 02/07/2021 Document Reviewed: 01/29/2021 Elsevier Patient Education  2024 [employer]   If you have been instructed to have an in-person evaluation today at a local Urgent Care facility, please use the link below. It will take you to a list of all of our available [facility], including address, phone number and hours of operation. Please do not delay care.  [facility]  If you or a family member do not have a primary care provider, use the link below to schedule a visit and establish care. When you choose a [facility] primary care physician or advanced practice provider, you gain a long-term partner in health. Find a Primary Care Provider  Learn more about [facility] in-office and virtual care options:  - Get Care Now
